# Patient Record
Sex: Female | Born: 1935 | Race: White | Hispanic: No | Smoking: Never smoker
Health system: Southern US, Community
[De-identification: ages and names within clinical notes are randomized; demographics above are authoritative.]

## PROBLEM LIST (undated history)

## (undated) DIAGNOSIS — F419 Anxiety disorder, unspecified: Secondary | ICD-10-CM

## (undated) DIAGNOSIS — K219 Gastro-esophageal reflux disease without esophagitis: Secondary | ICD-10-CM

## (undated) DIAGNOSIS — R0602 Shortness of breath: Secondary | ICD-10-CM

## (undated) DIAGNOSIS — J189 Pneumonia, unspecified organism: Secondary | ICD-10-CM

## (undated) DIAGNOSIS — F329 Major depressive disorder, single episode, unspecified: Secondary | ICD-10-CM

## (undated) DIAGNOSIS — F32A Depression, unspecified: Secondary | ICD-10-CM

## (undated) DIAGNOSIS — A419 Sepsis, unspecified organism: Secondary | ICD-10-CM

## (undated) DIAGNOSIS — G4730 Sleep apnea, unspecified: Secondary | ICD-10-CM

## (undated) HISTORY — PX: TONSILLECTOMY: SUR1361

## (undated) HISTORY — DX: Major depressive disorder, single episode, unspecified: F32.9

## (undated) HISTORY — DX: Hypercalcemia: E83.52

## (undated) HISTORY — DX: Depression, unspecified: F32.A

## (undated) HISTORY — PX: ENDOMETRIAL ABLATION: SHX621

---

## 2000-02-08 ENCOUNTER — Other Ambulatory Visit: Admission: RE | Admit: 2000-02-08 | Discharge: 2000-02-08 | Payer: Self-pay | Admitting: Family Medicine

## 2001-03-27 ENCOUNTER — Ambulatory Visit (HOSPITAL_BASED_OUTPATIENT_CLINIC_OR_DEPARTMENT_OTHER): Admission: RE | Admit: 2001-03-27 | Discharge: 2001-03-27 | Payer: Self-pay | Admitting: Family Medicine

## 2001-08-23 ENCOUNTER — Ambulatory Visit (HOSPITAL_BASED_OUTPATIENT_CLINIC_OR_DEPARTMENT_OTHER): Admission: RE | Admit: 2001-08-23 | Discharge: 2001-08-23 | Payer: Self-pay | Admitting: Internal Medicine

## 2004-06-27 ENCOUNTER — Ambulatory Visit: Payer: Self-pay | Admitting: Family Medicine

## 2004-08-23 ENCOUNTER — Ambulatory Visit: Payer: Self-pay | Admitting: Family Medicine

## 2005-07-09 ENCOUNTER — Ambulatory Visit: Payer: Self-pay | Admitting: Family Medicine

## 2005-09-30 ENCOUNTER — Ambulatory Visit: Payer: Self-pay | Admitting: Family Medicine

## 2006-03-06 ENCOUNTER — Ambulatory Visit: Payer: Self-pay | Admitting: Family Medicine

## 2007-03-10 ENCOUNTER — Ambulatory Visit: Payer: Self-pay | Admitting: Family Medicine

## 2007-10-25 ENCOUNTER — Ambulatory Visit: Payer: Self-pay | Admitting: Otolaryngology

## 2007-11-22 ENCOUNTER — Ambulatory Visit: Payer: Self-pay | Admitting: Otolaryngology

## 2008-10-27 ENCOUNTER — Ambulatory Visit: Payer: Self-pay | Admitting: Specialist

## 2009-08-12 HISTORY — PX: EYE SURGERY: SHX253

## 2009-10-03 ENCOUNTER — Ambulatory Visit: Payer: Self-pay | Admitting: Specialist

## 2010-03-05 ENCOUNTER — Emergency Department: Payer: Self-pay | Admitting: Emergency Medicine

## 2010-03-28 ENCOUNTER — Ambulatory Visit: Payer: Self-pay | Admitting: Psychiatry

## 2010-05-23 ENCOUNTER — Ambulatory Visit: Payer: Self-pay | Admitting: Internal Medicine

## 2010-06-08 ENCOUNTER — Ambulatory Visit: Payer: Self-pay | Admitting: Internal Medicine

## 2011-12-25 ENCOUNTER — Encounter: Payer: Self-pay | Admitting: Internal Medicine

## 2011-12-25 ENCOUNTER — Ambulatory Visit (INDEPENDENT_AMBULATORY_CARE_PROVIDER_SITE_OTHER): Payer: Medicare Other | Admitting: Internal Medicine

## 2011-12-25 VITALS — BP 116/64 | HR 70 | Temp 98.2°F | Resp 16 | Ht 63.0 in | Wt 168.2 lb

## 2011-12-25 DIAGNOSIS — N390 Urinary tract infection, site not specified: Secondary | ICD-10-CM | POA: Insufficient documentation

## 2011-12-25 DIAGNOSIS — F39 Unspecified mood [affective] disorder: Secondary | ICD-10-CM

## 2011-12-25 DIAGNOSIS — Z0000 Encounter for general adult medical examination without abnormal findings: Secondary | ICD-10-CM | POA: Insufficient documentation

## 2011-12-25 DIAGNOSIS — F32A Depression, unspecified: Secondary | ICD-10-CM | POA: Insufficient documentation

## 2011-12-25 DIAGNOSIS — Z79899 Other long term (current) drug therapy: Secondary | ICD-10-CM

## 2011-12-25 DIAGNOSIS — F329 Major depressive disorder, single episode, unspecified: Secondary | ICD-10-CM

## 2011-12-25 LAB — LIPID PANEL
HDL: 50.4 mg/dL (ref >39.00)
Triglycerides: 128 mg/dL (ref 0.0–149.0)
VLDL: 25.6 mg/dL (ref 0.0–40.0)

## 2011-12-25 LAB — POCT URINALYSIS DIPSTICK
Glucose, UA: NEGATIVE
Ketones, UA: NEGATIVE
Spec Grav, UA: 1.02

## 2011-12-25 LAB — HM COLONOSCOPY

## 2011-12-25 LAB — TSH: TSH: 0.83 u[IU]/mL (ref 0.35–5.50)

## 2011-12-25 LAB — HM MAMMOGRAPHY: HM Mammogram: NORMAL

## 2011-12-25 LAB — HEMOGLOBIN A1C: Hgb A1c MFr Bld: 5.5 % (ref 4.6–6.5)

## 2011-12-25 MED ORDER — CIPROFLOXACIN HCL 250 MG PO TABS: 250.0000 mg | ORAL_TABLET | Freq: Two times a day (BID) | ORAL | Status: AC

## 2011-12-25 NOTE — Patient Instructions (Signed)
Please complete the take-home stool test to screen for Colon Cancer .  I will call you with the results of your blood test.

## 2011-12-25 NOTE — Progress Notes (Signed)
Patient ID: [name], female   DOB: [birth year], 78 y.o.   MRN: [id number] The patient is here for annual Medicare wellness examination and management of other chronic and acute problems.   The risk factors are reflected in the social history.  The roster of all physicians providing medical care to patient - is listed in the Snapshot section of the chart.  Activities of daily living:  The patient is 100% independent in all ADLs: dressing, toileting, feeding as well as independent mobility  Home safety : The patient has smoke detectors in the home. They wear seatbelts.  There are no firearms at home. There is no violence in the home.   There is no risks for hepatitis, STDs or HIV. There is no   history of blood transfusion. They have no travel history to infectious disease endemic areas of the world.  The patient has seen their dentist in the last six month. They have seen their eye doctor in the last year. They admit to slight hearing difficulty with regard to whispered voices and some television programs.  They have deferred audiologic testing in the last year.  They do not  have excessive sun exposure. Discussed the need for sun protection: hats, long sleeves and use of sunscreen if there is significant sun exposure.   Diet: the importance of a healthy diet is discussed. They do have a healthy diet.  The benefits of regular aerobic exercise were discussed. She walks 4 times per week ,  20 minutes.   Depression screen: there are no signs or vegative symptoms of untreated depression- irritability, change in appetite, anhedonia, sadness/tearfullness.  Cognitive assessment: the patient manages all their financial and personal affairs and is actively engaged. They could relate day,date,year and events; recalled 2/3 objects at 3 minutes; performed clock-face test normally.  The following portions of the patient's history were reviewed and updated as appropriate: allergies, current medications,  past family history, past medical history,  past surgical history, past social history  and problem list.  Visual acuity was not assessed per patient preference since she has regular follow up with her ophthalmologist. Hearing and body mass index were assessed and reviewed.   During the course of the visit the patient was educated and counseled about appropriate screening and preventive services including : fall prevention , diabetes screening, nutrition counseling, colorectal cancer screening, and recommended immunizations.    Physical Exam BP 116/64  Pulse 70  Temp(Src) 98.2 F (36.8 C) (Oral)  Resp 16  Ht 5\' 3"  (1.6 m)  Wt 168 lb 4 oz (76.318 kg)  BMI 29.80 kg/m2  SpO2 96%  .BP 116/64  Pulse 70  Temp(Src) 98.2 F (36.8 C) (Oral)  Resp 16  Ht 5\' 3"  (1.6 m)  Wt 168 lb 4 oz (76.318 kg)  BMI 29.80 kg/m2  SpO2 96%  General Appearance:    Alert, cooperative, no distress, appears stated age  Head:    Normocephalic, without obvious abnormality, atraumatic  Eyes:    PERRL, conjunctiva/corneas clear, EOM's intact, fundi    benign, both eyes  Ears:    Normal TM's and external ear canals, both ears  Nose:   Nares normal, septum midline, mucosa normal, no drainage    or sinus tenderness  Throat:   Lips, mucosa, and tongue normal; teeth and gums normal  Neck:   Supple, symmetrical, trachea midline, no adenopathy;    thyroid:  no enlargement/tenderness/nodules; no carotid   bruit or JVD  Back:  Symmetric, no curvature, ROM normal, no CVA tenderness  Lungs:     Clear to auscultation bilaterally, respirations unlabored  Chest Wall:    No tenderness or deformity   Heart:    Regular rate and rhythm, S1 and S2 normal, no murmur, rub   or gallop  Breast Exam:    No tenderness, masses, or nipple abnormality  Abdomen:     Soft, non-tender, bowel sounds active all four quadrants,    no masses, no organomegaly  Genitalia:    Urethral polyp.  Otherwise Normal female without lesion,  discharge or tenderness     Extremities:   Extremities normal, atraumatic, no cyanosis or edema  Pulses:   2+ and symmetric all extremities  Skin:   Skin color, texture, turgor normal, no rashes or lesions  Lymph nodes:   Cervical, supraclavicular, and axillary nodes normal  Neurologic:   CNII-XII intact, normal strength, sensation and reflexes    throughout   Annual physical exam Pelvic was done. She hs a urethral polyp which she states was evaluated after prior pelvic exam so she has deferred further evaluation.  Breast exam done,  she was bilaterally tender.  Refuses mammogram and colonoscopy due to pain issue.    Depression Chronic managed with the program and Abilify. Her daughter who has accompanied her today she is functional and socially interactive.  UTI (lower urinary tract infection) A urinalysis was checked since patient was reporting recent onset of frequency.    Updated Medication List Outpatient Encounter Prescriptions as of 12/25/2011  Medication Sig Dispense Refill  . ARIPiprazole (ABILIFY) 5 MG tablet Take 5 mg by mouth daily.      Marland Kitchen buPROPion (WELLBUTRIN SR) 200 MG 12 hr tablet Take 200 mg by mouth daily.      . ciprofloxacin (CIPRO) 250 MG tablet Take 1 tablet (250 mg total) by mouth 2 (two) times daily.  10 tablet  0  . escitalopram (LEXAPRO) 20 MG tablet Take 20 mg by mouth daily.      . Multiple Vitamin (MULTIVITAMIN) tablet Take 1 tablet by mouth daily.      Marland Kitchen omeprazole (PRILOSEC OTC) 20 MG tablet Take 20 mg by mouth daily.

## 2011-12-26 ENCOUNTER — Other Ambulatory Visit: Payer: Self-pay | Admitting: *Deleted

## 2011-12-26 LAB — COMPLETE METABOLIC PANEL WITH GFR
Alkaline Phosphatase: 69 U/L (ref 39–117)
BUN: 19 mg/dL (ref 6–23)
CO2: 25 mEq/L (ref 19–32)
Creat: 1.05 mg/dL (ref 0.50–1.10)
GFR, Est African American: 59 mL/min — ABNORMAL LOW
GFR, Est Non African American: 51 mL/min — ABNORMAL LOW
Glucose, Bld: 78 mg/dL (ref 70–99)
Total Bilirubin: 0.6 mg/dL (ref 0.3–1.2)

## 2011-12-26 MED ORDER — CIPROFLOXACIN HCL 250 MG PO TABS: 250.0000 mg | ORAL_TABLET | Freq: Two times a day (BID) | ORAL | Status: AC

## 2011-12-26 NOTE — Assessment & Plan Note (Signed)
Chronic managed with the program and Abilify. Her daughter who has accompanied her today she is functional and socially interactive.

## 2011-12-26 NOTE — Assessment & Plan Note (Signed)
A urinalysis was checked since patient was reporting recent onset of frequency.

## 2011-12-26 NOTE — Assessment & Plan Note (Signed)
Pelvic was done. She hs a urethral polyp which she states was evaluated after prior pelvic exam so she has deferred further evaluation.  Breast exam done,  she was bilaterally tender.  Refuses mammogram and colonoscopy due to pain issue.

## 2011-12-27 LAB — URINE CULTURE: Colony Count: 2000

## 2012-05-18 ENCOUNTER — Encounter: Payer: Self-pay | Admitting: Internal Medicine

## 2012-06-05 ENCOUNTER — Telehealth: Payer: Self-pay | Admitting: Internal Medicine

## 2012-06-05 NOTE — Telephone Encounter (Signed)
Pt called to see if she could switch providers from dr Darrick Huntsman to dr walker Pt has 11/18 appointment with dr Darrick Huntsman Pt stated no reason.  She just wanted to see dr walker

## 2012-06-05 NOTE — Telephone Encounter (Signed)
Ok with me 

## 2012-06-18 NOTE — Telephone Encounter (Signed)
Fine with me

## 2012-06-18 NOTE — Telephone Encounter (Signed)
I will ask [name] to schedule patient with Dr. Dan Humphreys, next available.

## 2012-06-29 ENCOUNTER — Ambulatory Visit (INDEPENDENT_AMBULATORY_CARE_PROVIDER_SITE_OTHER): Payer: Medicare Other | Admitting: Internal Medicine

## 2012-06-29 ENCOUNTER — Encounter: Payer: Self-pay | Admitting: Internal Medicine

## 2012-06-29 ENCOUNTER — Encounter: Payer: Self-pay | Admitting: *Deleted

## 2012-06-29 ENCOUNTER — Ambulatory Visit: Payer: Medicare Other | Admitting: Internal Medicine

## 2012-06-29 VITALS — BP 104/60 | HR 72 | Temp 98.2°F | Ht 63.0 in | Wt 175.5 lb

## 2012-06-29 DIAGNOSIS — F32A Depression, unspecified: Secondary | ICD-10-CM

## 2012-06-29 DIAGNOSIS — K219 Gastro-esophageal reflux disease without esophagitis: Secondary | ICD-10-CM | POA: Insufficient documentation

## 2012-06-29 DIAGNOSIS — F329 Major depressive disorder, single episode, unspecified: Secondary | ICD-10-CM

## 2012-06-29 DIAGNOSIS — R197 Diarrhea, unspecified: Secondary | ICD-10-CM | POA: Insufficient documentation

## 2012-06-29 DIAGNOSIS — N390 Urinary tract infection, site not specified: Secondary | ICD-10-CM

## 2012-06-29 LAB — COMPREHENSIVE METABOLIC PANEL
ALT: 14 U/L (ref 0–35)
AST: 18 U/L (ref 0–37)
Albumin: 4.3 g/dL (ref 3.5–5.2)
CO2: 27 mEq/L (ref 19–32)
Calcium: 10.5 mg/dL (ref 8.4–10.5)
Chloride: 103 mEq/L (ref 96–112)
Potassium: 4.3 mEq/L (ref 3.5–5.1)

## 2012-06-29 LAB — POCT URINALYSIS DIPSTICK
Bilirubin, UA: NEGATIVE
Glucose, UA: NEGATIVE
Nitrite, UA: NEGATIVE
Spec Grav, UA: >=1.03

## 2012-06-29 MED ORDER — OMEPRAZOLE MAGNESIUM 20 MG PO TBEC: 20.0000 mg | DELAYED_RELEASE_TABLET | Freq: Every day | ORAL | Status: DC

## 2012-06-29 NOTE — Progress Notes (Signed)
Subjective:    Patient ID: [name], female    DOB: 04-Sep-1931, 78 y.o.   MRN: [id number]  HPI 78 year old female with history of severe depression, GERD presents to establish care. She was previously seen by another provider in our office. She presents today with her daughter. Her daughter reports that her symptoms of depression are much improved on current medications however patient continues to have some difficult interactions with family members and appears to be withdrawn. Patient has been reluctant to make any change in her antidepressant medications. She continues to follow with psychiatry every 2 months. Her daughter reports that at her worst, she had some paranoia and delusions with depression. She has not had recurrent of paranoia on current medications.  Patient is concerned today about a one-year history of intermittent watery diarrhea. Diarrhea typically occurs after eating. There are no specific food triggers. Patient denies any abdominal pain, fever, chills, nausea, vomiting. She has not lost any weight. She does not report change in appetite. She has never had a colonoscopy and is not interested in colonoscopy at this time. She notes that several other family members including her son and daughter both have irritable bowel syndrome.   Outpatient Prescriptions Prior to Visit  Medication Sig Dispense Refill  . ARIPiprazole (ABILIFY) 5 MG tablet Take 5 mg by mouth daily.      Marland Kitchen buPROPion (WELLBUTRIN SR) 200 MG 12 hr tablet Take 200 mg by mouth daily.      Marland Kitchen escitalopram (LEXAPRO) 20 MG tablet Take 20 mg by mouth daily.      . Multiple Vitamin (MULTIVITAMIN) tablet Take 1 tablet by mouth daily.      Marland Kitchen omeprazole (PRILOSEC OTC) 20 MG tablet Take 20 mg by mouth daily.         BP 104/60  Pulse 72  Temp 98.2 F (36.8 C) (Oral)  Ht 5\' 3"  (1.6 m)  Wt 175 lb 8 oz (79.606 kg)  BMI 31.09 kg/m2  SpO2 97%    Review of Systems  Constitutional: Negative for fever, chills,  appetite change, fatigue and unexpected weight change.  HENT: Negative for ear pain, congestion, sore throat, trouble swallowing, neck pain, voice change and sinus pressure.   Eyes: Negative for visual disturbance.  Respiratory: Negative for cough, shortness of breath, wheezing and stridor.   Cardiovascular: Negative for chest pain, palpitations and leg swelling.  Gastrointestinal: Positive for diarrhea. Negative for nausea, vomiting, abdominal pain, constipation, blood in stool, abdominal distention and anal bleeding.  Genitourinary: Negative for dysuria and flank pain.  Musculoskeletal: Negative for myalgias, arthralgias and gait problem.  Skin: Negative for color change and rash.  Neurological: Negative for dizziness and headaches.  Hematological: Negative for adenopathy. Does not bruise/bleed easily.  Psychiatric/Behavioral: Positive for behavioral problems and dysphoric mood. Negative for suicidal ideas and sleep disturbance. The patient is nervous/anxious.        Objective:   Physical Exam  Constitutional: She is oriented to person, place, and time. She appears well-developed and well-nourished. No distress.  HENT:  Head: Normocephalic and atraumatic.  Right Ear: External ear normal.  Left Ear: External ear normal.  Nose: Nose normal.  Mouth/Throat: Oropharynx is clear and moist. No oropharyngeal exudate.  Eyes: Conjunctivae normal are normal. Pupils are equal, round, and reactive to light. Right eye exhibits no discharge. Left eye exhibits no discharge. No scleral icterus.  Neck: Normal range of motion. Neck supple. No tracheal deviation present. No thyromegaly present.  Cardiovascular: Normal rate, regular rhythm,  normal heart sounds and intact distal pulses.  Exam reveals no gallop and no friction rub.   No murmur heard. Pulmonary/Chest: Effort normal and breath sounds normal. No respiratory distress. She has no wheezes. She has no rales. She exhibits no tenderness.  Abdominal:  Soft. Normal appearance and bowel sounds are normal. There is no hepatosplenomegaly. There is no tenderness.  Musculoskeletal: Normal range of motion. She exhibits no edema and no tenderness.  Lymphadenopathy:    She has no cervical adenopathy.  Neurological: She is alert and oriented to person, place, and time. No cranial nerve deficit. She exhibits normal muscle tone. Coordination normal.  Skin: Skin is warm and dry. No rash noted. She is not diaphoretic. No erythema. No pallor.  Psychiatric: She has a normal mood and affect. Judgment and thought content normal. Her speech is delayed. She is withdrawn. Cognition and memory are normal.          Assessment & Plan:

## 2012-06-29 NOTE — Addendum Note (Signed)
Addended by: Montine Circle D on: 06/29/2012 02:10 PM   Modules accepted: Orders

## 2012-06-29 NOTE — Assessment & Plan Note (Signed)
Chronic. Symptoms are controlled however not ideal. Patient continues to be withdrawn, interacts poorly with family. Patient has been unwilling to try changes in medications. Will request previous records from her psychiatrist. [name] continue current medications.

## 2012-06-29 NOTE — Assessment & Plan Note (Signed)
One year history of watery intermittent diarrhea. Symptoms seem most consistent with irritable bowel syndrome. However, will get stool culture. We also discussed referral to GI for evaluation to include colonoscopy however patient would like to defer this for now. Followup one month.

## 2012-07-01 LAB — URINE CULTURE: Organism ID, Bacteria: NO GROWTH

## 2012-07-03 ENCOUNTER — Other Ambulatory Visit: Payer: Self-pay | Admitting: *Deleted

## 2012-07-03 ENCOUNTER — Other Ambulatory Visit: Payer: Self-pay | Admitting: General Practice

## 2012-07-03 DIAGNOSIS — R197 Diarrhea, unspecified: Secondary | ICD-10-CM

## 2012-07-03 MED ORDER — CIPROFLOXACIN HCL 250 MG PO TABS: 250.0000 mg | ORAL_TABLET | Freq: Two times a day (BID) | ORAL | Status: DC

## 2012-07-07 LAB — STOOL CULTURE

## 2012-07-31 ENCOUNTER — Ambulatory Visit: Payer: Medicare Other | Admitting: Internal Medicine

## 2012-08-10 ENCOUNTER — Ambulatory Visit: Payer: Medicare Other | Admitting: Internal Medicine

## 2012-08-20 ENCOUNTER — Other Ambulatory Visit: Payer: Self-pay | Admitting: Internal Medicine

## 2012-08-20 ENCOUNTER — Ambulatory Visit (INDEPENDENT_AMBULATORY_CARE_PROVIDER_SITE_OTHER): Payer: Medicare Other | Admitting: Internal Medicine

## 2012-08-20 ENCOUNTER — Encounter: Payer: Self-pay | Admitting: Internal Medicine

## 2012-08-20 VITALS — BP 130/80 | HR 71 | Temp 97.7°F | Ht 63.0 in | Wt 179.0 lb

## 2012-08-20 DIAGNOSIS — F32A Depression, unspecified: Secondary | ICD-10-CM

## 2012-08-20 DIAGNOSIS — F329 Major depressive disorder, single episode, unspecified: Secondary | ICD-10-CM

## 2012-08-20 DIAGNOSIS — R197 Diarrhea, unspecified: Secondary | ICD-10-CM

## 2012-08-20 DIAGNOSIS — N760 Acute vaginitis: Secondary | ICD-10-CM

## 2012-08-20 MED ORDER — FLUCONAZOLE 150 MG PO TABS: 150.0000 mg | ORAL_TABLET | Freq: Once | ORAL | Status: DC

## 2012-08-20 NOTE — Assessment & Plan Note (Signed)
Symptoms and exam are most consistent with vaginal candidiasis. Will treat with Diflucan. Will send wet prep today.

## 2012-08-20 NOTE — Addendum Note (Signed)
Addended by: Montine Circle D on: 08/20/2012 01:39 PM   Modules accepted: Orders

## 2012-08-20 NOTE — Patient Instructions (Signed)
Vanicream twice daily for dry skin

## 2012-08-20 NOTE — Assessment & Plan Note (Signed)
Symptoms are relatively stable, with recent increase in Abilify to 5 mg daily. Will continue psychiatric followup. Encouraged her daughter to try to have psychiatric records and our office for review.

## 2012-08-20 NOTE — Progress Notes (Signed)
Subjective:    Patient ID: [name], female    DOB: [birth year], 79 y.o.   MRN: [id number]  HPI 79 year old female with history of depression, intermittent episodes of abdominal distention and diarrhea presents for followup. In regards to depression, she notes that her psychiatrist recently increased her dose of Abilify. She has not noted any change in her symptoms with the change in medication. She feels that she is doing relatively well. Her daughter reports that she still has limited interaction with family members.  In regards to intermittent episodes of diarrhea, she reports that symptoms have improved with elimination of coffee from her diet. She denies any current abdominal pain, distention, blood in her stool. She is not interested in GI evaluation for colonoscopy. Next  She is concerned about several year history of intermittent vaginal discharge and itching. This seems to come and go without any particular pattern. She has not used any over-the-counter preparations. She denies any pelvic pain, fever, chills.  Outpatient Encounter Prescriptions as of 08/20/2012  Medication Sig Dispense Refill  . ARIPiprazole (ABILIFY) 5 MG tablet Take 5 mg by mouth daily.      Marland Kitchen buPROPion (WELLBUTRIN SR) 200 MG 12 hr tablet Take 200 mg by mouth daily.      Marland Kitchen escitalopram (LEXAPRO) 20 MG tablet Take 20 mg by mouth daily.      . Multiple Vitamin (MULTIVITAMIN) tablet Take 1 tablet by mouth daily.      Marland Kitchen omeprazole (PRILOSEC OTC) 20 MG tablet Take 1 tablet (20 mg total) by mouth daily.  30 tablet  11  . fluconazole (DIFLUCAN) 150 MG tablet Take 1 tablet (150 mg total) by mouth once.  1 tablet  0  . [DISCONTINUED] ciprofloxacin (CIPRO) 250 MG tablet Take 1 tablet (250 mg total) by mouth 2 (two) times daily.  14 tablet  0   BP 130/80  Pulse 71  Temp 97.7 F (36.5 C) (Oral)  Ht 5\' 3"  (1.6 m)  Wt 179 lb (81.194 kg)  BMI 31.71 kg/m2  SpO2 97%  Review of Systems  Constitutional: Negative for fever,  chills, appetite change, fatigue and unexpected weight change.  HENT: Negative for ear pain, congestion, sore throat, trouble swallowing, neck pain, voice change and sinus pressure.   Eyes: Negative for visual disturbance.  Respiratory: Negative for cough, shortness of breath, wheezing and stridor.   Cardiovascular: Negative for chest pain, palpitations and leg swelling.  Gastrointestinal: Negative for nausea, vomiting, abdominal pain, diarrhea, constipation, blood in stool, abdominal distention and anal bleeding.  Genitourinary: Positive for vaginal discharge. Negative for dysuria and flank pain.  Musculoskeletal: Negative for myalgias, arthralgias and gait problem.  Skin: Negative for color change and rash.  Neurological: Negative for dizziness and headaches.  Hematological: Negative for adenopathy. Does not bruise/bleed easily.  Psychiatric/Behavioral: Positive for behavioral problems and dysphoric mood. Negative for suicidal ideas and sleep disturbance. The patient is not nervous/anxious.        Objective:   Physical Exam  Constitutional: She is oriented to person, place, and time. She appears well-developed and well-nourished. No distress.  HENT:  Head: Normocephalic and atraumatic.  Right Ear: External ear normal.  Left Ear: External ear normal.  Nose: Nose normal.  Mouth/Throat: Oropharynx is clear and moist. No oropharyngeal exudate.  Eyes: Conjunctivae normal are normal. Pupils are equal, round, and reactive to light. Right eye exhibits no discharge. Left eye exhibits no discharge. No scleral icterus.  Neck: Normal range of motion. Neck supple. No tracheal deviation  present. No thyromegaly present.  Cardiovascular: Normal rate, regular rhythm, normal heart sounds and intact distal pulses.  Exam reveals no gallop and no friction rub.   No murmur heard. Pulmonary/Chest: Effort normal and breath sounds normal. No respiratory distress. She has no wheezes. She has no rales. She  exhibits no tenderness.  Genitourinary: There is erythema around the vagina. No tenderness around the vagina. Vaginal discharge (white) found.  Musculoskeletal: Normal range of motion. She exhibits no edema and no tenderness.  Lymphadenopathy:    She has no cervical adenopathy.  Neurological: She is alert and oriented to person, place, and time. No cranial nerve deficit. She exhibits normal muscle tone. Coordination normal.  Skin: Skin is warm and dry. No rash noted. She is not diaphoretic. No erythema. No pallor.  Psychiatric: Judgment and thought content normal. Her speech is delayed. She is withdrawn. Cognition and memory are normal. She exhibits a depressed mood.          Assessment & Plan:

## 2012-08-20 NOTE — Assessment & Plan Note (Signed)
Symptoms have improved with elimination of coffee from her diet. We'll continue to monitor. Encouraged her to consider GI evaluation with colonoscopy if symptoms are recurrent.

## 2012-08-22 LAB — WET PREP BY MOLECULAR PROBE
Candida species: NEGATIVE
Trichomonas vaginosis: NEGATIVE

## 2012-08-25 MED ORDER — METRONIDAZOLE 500 MG PO TABS: 500.0000 mg | ORAL_TABLET | Freq: Two times a day (BID) | ORAL | Status: DC

## 2012-08-25 NOTE — Addendum Note (Signed)
Addended by: Ronna Polio A on: 08/25/2012 06:04 PM   Modules accepted: Orders

## 2012-09-19 ENCOUNTER — Emergency Department: Payer: Self-pay | Admitting: Emergency Medicine

## 2012-10-06 ENCOUNTER — Telehealth: Payer: Self-pay | Admitting: *Deleted

## 2012-10-06 NOTE — Telephone Encounter (Signed)
I agree with the leg elevation.  Monitor salt intake, etc.  With all of these issues, she needs an appt for evaluation.

## 2012-10-06 NOTE — Telephone Encounter (Signed)
Patient's daughter called stating that patient fell 09/19/12 and broke her left arm. Patient is having a lot of swelling in her legs and feet, especially her left and wants to know what she should do?. Patient is having  problems sleeping and does not want to lie down. Patient has been sleeping sitting up on the sofa or in the recliner. Patient's daughter wants to know if Dr. Dan Humphreys thinks that she may benefit from a powered lift recliner chair and if so, can she write a rx for this?.  Advised patient's daughter that she needs to make sure that she keeps her legs elevated. Patient's daughter advised that Dr. Dan Humphreys is out of the office and will return Wednesday.

## 2012-10-06 NOTE — Telephone Encounter (Signed)
Patient notified as instructed by telephone. Appointment scheduled for Friday.

## 2012-10-09 ENCOUNTER — Ambulatory Visit (INDEPENDENT_AMBULATORY_CARE_PROVIDER_SITE_OTHER): Payer: Medicare Other | Admitting: Internal Medicine

## 2012-10-09 ENCOUNTER — Encounter: Payer: Self-pay | Admitting: Internal Medicine

## 2012-10-09 VITALS — BP 110/70 | HR 78 | Temp 98.8°F | Wt 181.0 lb

## 2012-10-09 DIAGNOSIS — S42412A Displaced simple supracondylar fracture without intercondylar fracture of left humerus, initial encounter for closed fracture: Secondary | ICD-10-CM | POA: Insufficient documentation

## 2012-10-09 DIAGNOSIS — S42412S Displaced simple supracondylar fracture without intercondylar fracture of left humerus, sequela: Secondary | ICD-10-CM

## 2012-10-09 DIAGNOSIS — R609 Edema, unspecified: Secondary | ICD-10-CM

## 2012-10-09 DIAGNOSIS — R600 Localized edema: Secondary | ICD-10-CM | POA: Insufficient documentation

## 2012-10-09 DIAGNOSIS — S42309S Unspecified fracture of shaft of humerus, unspecified arm, sequela: Secondary | ICD-10-CM

## 2012-10-09 DIAGNOSIS — S42413A Displaced simple supracondylar fracture without intercondylar fracture of unspecified humerus, initial encounter for closed fracture: Secondary | ICD-10-CM | POA: Insufficient documentation

## 2012-10-09 MED ORDER — HYDROCODONE-ACETAMINOPHEN 5-325 MG PO TABS: 1.0000 | ORAL_TABLET | Freq: Every evening | ORAL | Status: DC | PRN

## 2012-10-10 NOTE — Progress Notes (Signed)
Subjective:    Patient ID: [name], female    DOB: January 10, 1932, 79 y.o.   MRN: [id number]  HPI 79YO female with h/o depression presents for acute visit after recent fall resulting in left humerus fracture.  Pt is now followed by orthopedics. Left arm has been in sling. Pain poorly controlled with use of tylenol. Pt has Rx for Vicodin but has not been taking it.  Unable to sleep at night because of pain.    Over last several days, has developed worsening LE edema bilaterally. Family notes pt has been seated for prolonged periods. Refusing to ambulate because of arm pain. They have tried to keep legs elevated and limit salt intake with no improvement.  Outpatient Encounter Prescriptions as of 10/09/2012  Medication Sig Dispense Refill  . ARIPiprazole (ABILIFY) 5 MG tablet Take 5 mg by mouth daily.      Marland Kitchen buPROPion (WELLBUTRIN SR) 200 MG 12 hr tablet Take 200 mg by mouth daily.      . Multiple Vitamin (MULTIVITAMIN) tablet Take 1 tablet by mouth daily.      Marland Kitchen omeprazole (PRILOSEC OTC) 20 MG tablet Take 1 tablet (20 mg total) by mouth daily.  30 tablet  11  . escitalopram (LEXAPRO) 20 MG tablet Take 20 mg by mouth daily.      . fluconazole (DIFLUCAN) 150 MG tablet Take 1 tablet (150 mg total) by mouth once.  1 tablet  0  . HYDROcodone-acetaminophen (NORCO/VICODIN) 5-325 MG per tablet Take 1-2 tablets by mouth at bedtime as needed for pain.  60 tablet  1  . metroNIDAZOLE (FLAGYL) 500 MG tablet Take 1 tablet (500 mg total) by mouth 2 (two) times daily.  14 tablet  0   No facility-administered encounter medications on file as of 10/09/2012.   BP 110/70  Pulse 78  Temp(Src) 98.8 F (37.1 C) (Oral)  Wt 181 lb (82.101 kg)  BMI 32.07 kg/m2  SpO2 95%  Review of Systems  Constitutional: Negative for fever, chills, appetite change, fatigue and unexpected weight change.  HENT: Negative for ear pain, congestion, sore throat, trouble swallowing, neck pain, voice change and sinus pressure.   Eyes:  Negative for visual disturbance.  Respiratory: Negative for cough, shortness of breath, wheezing and stridor.   Cardiovascular: Positive for leg swelling. Negative for chest pain and palpitations.  Gastrointestinal: Negative for nausea, vomiting, abdominal pain, diarrhea, constipation, blood in stool, abdominal distention and anal bleeding.  Genitourinary: Negative for dysuria and flank pain.  Musculoskeletal: Positive for arthralgias. Negative for myalgias and gait problem.  Skin: Negative for color change and rash.  Neurological: Negative for dizziness and headaches.  Hematological: Negative for adenopathy. Does not bruise/bleed easily.  Psychiatric/Behavioral: Negative for suicidal ideas, sleep disturbance and dysphoric mood. The patient is not nervous/anxious.        Objective:   Physical Exam  Constitutional: She is oriented to person, place, and time. She appears well-developed and well-nourished. No distress.  HENT:  Head: Normocephalic and atraumatic.  Right Ear: External ear normal.  Left Ear: External ear normal.  Nose: Nose normal.  Mouth/Throat: Oropharynx is clear and moist. No oropharyngeal exudate.  Eyes: Conjunctivae are normal. Pupils are equal, round, and reactive to light. Right eye exhibits no discharge. Left eye exhibits no discharge. No scleral icterus.  Neck: Normal range of motion. Neck supple. No tracheal deviation present. No thyromegaly present.  Cardiovascular: Normal rate, regular rhythm, normal heart sounds and intact distal pulses.  Exam reveals no gallop and  no friction rub.   No murmur heard. Pulmonary/Chest: Effort normal and breath sounds normal. No respiratory distress. She has no wheezes. She has no rales. She exhibits no tenderness.  Musculoskeletal: Normal range of motion. She exhibits edema (pitting edema bilaterl LE to knees, negative Homan bilaterally). She exhibits no tenderness.       Left upper arm: She exhibits tenderness, bony tenderness and  swelling.  Lymphadenopathy:    She has no cervical adenopathy.  Neurological: She is alert and oriented to person, place, and time. No cranial nerve deficit. She exhibits normal muscle tone. Coordination normal.  Skin: Skin is warm and dry. No rash noted. She is not diaphoretic. No erythema. No pallor.  Psychiatric: She has a normal mood and affect. Her behavior is normal. Judgment and thought content normal.          Assessment & Plan:

## 2012-10-10 NOTE — Assessment & Plan Note (Signed)
Most likely secondary to chronic venous insufficiency, made worse by recent sedentary behavior. Also discussed possibility of DVT, and recommended BLE ultrasound for evaluation. Pt declines at this time. Family will monitor symptoms over the weekend. They will keep legs elevated and use compression stockings during the day. Rx given for compression stockings. Follow up 2 weeks and prn.

## 2012-10-10 NOTE — Assessment & Plan Note (Signed)
Left humerus fracture after fall. Followed by ortho. Unable to sleep because of pain. Discussed using Tylenol during the day and Vicodin at night. Discussed limit of 3000mg  of tylenol per day. Pt has follow up with ortho next week.

## 2012-10-19 ENCOUNTER — Ambulatory Visit: Payer: Medicare Other | Admitting: Internal Medicine

## 2012-10-30 ENCOUNTER — Inpatient Hospital Stay: Payer: Self-pay | Admitting: Internal Medicine

## 2012-10-30 ENCOUNTER — Telehealth: Payer: Self-pay | Admitting: Internal Medicine

## 2012-10-30 LAB — URINALYSIS, COMPLETE
Bilirubin,UR: NEGATIVE
Glucose,UR: NEGATIVE mg/dL (ref 0–75)
Ketone: NEGATIVE
Nitrite: NEGATIVE
Ph: 8 (ref 4.5–8.0)
Protein: 30
RBC,UR: 27 /HPF (ref 0–5)
Specific Gravity: 1.058 (ref 1.003–1.030)
Squamous Epithelial: 3
WBC UR: 2 /HPF (ref 0–5)

## 2012-10-30 LAB — COMPREHENSIVE METABOLIC PANEL
Albumin: 3.3 g/dL — ABNORMAL LOW (ref 3.4–5.0)
Alkaline Phosphatase: 80 U/L (ref 50–136)
Anion Gap: 8 (ref 7–16)
BUN: 15 mg/dL (ref 7–18)
Bilirubin,Total: 0.7 mg/dL (ref 0.2–1.0)
Chloride: 102 mmol/L (ref 98–107)
Creatinine: 1.04 mg/dL (ref 0.60–1.30)
EGFR (African American): 60 — ABNORMAL LOW
EGFR (Non-African Amer.): 51 — ABNORMAL LOW
Glucose: 101 mg/dL — ABNORMAL HIGH (ref 65–99)
Osmolality: 275 (ref 275–301)
Potassium: 3.8 mmol/L (ref 3.5–5.1)
SGOT(AST): 14 U/L — ABNORMAL LOW (ref 15–37)
SGPT (ALT): 13 U/L (ref 12–78)
Sodium: 137 mmol/L (ref 136–145)
Total Protein: 7.7 g/dL (ref 6.4–8.2)

## 2012-10-30 LAB — CBC
HGB: 13.3 g/dL (ref 12.0–16.0)
MCH: 28.3 pg (ref 26.0–34.0)
MCHC: 32.2 g/dL (ref 32.0–36.0)
MCV: 88 fL (ref 80–100)
WBC: 11 10*3/uL (ref 3.6–11.0)

## 2012-10-30 LAB — LIPASE, BLOOD: Lipase: 49 U/L — ABNORMAL LOW (ref 73–393)

## 2012-10-30 NOTE — Telephone Encounter (Signed)
Patient Information:  Caller Name: Carney Bern  Phone: [phone]  Patient: [name], [name]  Gender: Female  DOB: 05/20/1932  Age: 79 Years  PCP: Ronna Polio (Adults only)  Office Follow Up:  Does the office need to follow up with this patient?: Yes  Instructions For The Office: No appts available now to meet "go to office now" disposition. Please call and advise if pt should go to ED.   Symptoms  Reason For Call & Symptoms: Pt started with back pain yesterday. Into the evening she developed a fever and began to c/o the back pain being located behind her ribs and up to her back on the right hand side. Pt had an oral temp of 100 yesterday (she's afeb today). She also began to complain that it hurts to breathe on both the in and out breath. She is weak and SOB when she gets up to move around.   Reviewed Health History In EMR: Yes  Reviewed Medications In EMR: Yes  Reviewed Allergies In EMR: Yes  Reviewed Surgeries / Procedures: Yes  Date of Onset of Symptoms: 10/29/2012  Guideline(s) Used:  Breathing Difficulty  Disposition Per Guideline:   Go to Office Now  Reason For Disposition Reached:   Mild difficulty breathing (e.g., minimal/no SOB at rest, SOB with walking, pulse < 100) of new onset or worse than normal  Advice Given:  N/A  RN Overrode Recommendation:  Document Patient  No appts available/please call and advise if pt should be seen in ED.

## 2012-10-30 NOTE — Telephone Encounter (Signed)
Informed patient daughter no openings available today and per Dr. Dan Humphreys she should go to urgent care.

## 2012-10-30 NOTE — Telephone Encounter (Signed)
See below my chart note.  Spoke with jean.  She stated she was waiting on call from dr walker nurse   Message    Appointment Request From: Armstead Peaks      With Provider: Shelia Media, MD [-Primary Care Physician-]      Preferred Date Range: From 10/30/2012 To 10/30/2012      Preferred Times: Friday Morning      Reason for visit: Office Visit      Comments:   Mom [facility]) is experiencing severe pains radiating from her back up to her shoulder on the right side. She was running fever yesterday afternoon and has not eaten anything solid since breakfast yesterday morning. Can she see Dr. Dan Humphreys this morning? I will try to call in when office opens, but urgent that I hear from someone this morning.      Thank you,   Erline Hau

## 2012-10-31 LAB — CBC WITH DIFFERENTIAL/PLATELET
Eosinophil %: 0.1 %
HCT: 38 % (ref 35.0–47.0)
HGB: 12.6 g/dL (ref 12.0–16.0)
Lymphocyte #: 1 10*3/uL (ref 1.0–3.6)
Lymphocyte %: 9.6 %
MCHC: 33 g/dL (ref 32.0–36.0)
MCV: 88 fL (ref 80–100)
Monocyte %: 6.6 %
Platelet: 194 10*3/uL (ref 150–440)
RBC: 4.34 10*6/uL (ref 3.80–5.20)
RDW: 14.3 % (ref 11.5–14.5)

## 2012-10-31 LAB — BASIC METABOLIC PANEL
Anion Gap: 8 (ref 7–16)
BUN: 13 mg/dL (ref 7–18)
Calcium, Total: 9.7 mg/dL (ref 8.5–10.1)
EGFR (African American): >60
EGFR (Non-African Amer.): 58 — ABNORMAL LOW
Glucose: 91 mg/dL (ref 65–99)
Sodium: 139 mmol/L (ref 136–145)

## 2012-11-02 LAB — BASIC METABOLIC PANEL
Anion Gap: 4 — ABNORMAL LOW (ref 7–16)
BUN: 16 mg/dL (ref 7–18)
Calcium, Total: 9.4 mg/dL (ref 8.5–10.1)
Chloride: 106 mmol/L (ref 98–107)
Creatinine: 0.99 mg/dL (ref 0.60–1.30)
EGFR (African American): >60
Osmolality: 274 (ref 275–301)

## 2012-11-02 LAB — CBC WITH DIFFERENTIAL/PLATELET
Basophil %: 0.7 %
HGB: 11.9 g/dL — ABNORMAL LOW (ref 12.0–16.0)
Lymphocyte #: 1.2 10*3/uL (ref 1.0–3.6)
Lymphocyte %: 21.7 %
MCH: 29.1 pg (ref 26.0–34.0)
MCHC: 33.7 g/dL (ref 32.0–36.0)
Monocyte #: 0.5 x10 3/mm (ref 0.2–0.9)
Monocyte %: 8.8 %
Neutrophil #: 3.6 10*3/uL (ref 1.4–6.5)
Neutrophil %: 65.6 %
RBC: 4.11 10*6/uL (ref 3.80–5.20)
RDW: 13.8 % (ref 11.5–14.5)

## 2012-11-04 ENCOUNTER — Telehealth: Payer: Self-pay | Admitting: Internal Medicine

## 2012-11-04 LAB — CLOSTRIDIUM DIFFICILE BY PCR

## 2012-11-04 NOTE — Telephone Encounter (Signed)
Fwd to Dr. Walker 

## 2012-11-04 NOTE — Telephone Encounter (Signed)
Patient scheduled for a hospital follow up on 4.2.14

## 2012-11-11 ENCOUNTER — Encounter: Payer: Self-pay | Admitting: Internal Medicine

## 2012-11-11 ENCOUNTER — Ambulatory Visit (INDEPENDENT_AMBULATORY_CARE_PROVIDER_SITE_OTHER): Payer: Medicare Other | Admitting: Internal Medicine

## 2012-11-11 VITALS — BP 108/68 | HR 74 | Temp 98.4°F | Wt 172.0 lb

## 2012-11-11 DIAGNOSIS — Z09 Encounter for follow-up examination after completed treatment for conditions other than malignant neoplasm: Secondary | ICD-10-CM | POA: Insufficient documentation

## 2012-11-11 DIAGNOSIS — R197 Diarrhea, unspecified: Secondary | ICD-10-CM

## 2012-11-11 DIAGNOSIS — J189 Pneumonia, unspecified organism: Secondary | ICD-10-CM

## 2012-11-11 DIAGNOSIS — R600 Localized edema: Secondary | ICD-10-CM

## 2012-11-11 DIAGNOSIS — R609 Edema, unspecified: Secondary | ICD-10-CM

## 2012-11-11 DIAGNOSIS — A419 Sepsis, unspecified organism: Secondary | ICD-10-CM | POA: Insufficient documentation

## 2012-11-11 LAB — CBC WITH DIFFERENTIAL/PLATELET
Basophils Absolute: 0.1 10*3/uL (ref 0.0–0.1)
Eosinophils Absolute: 0 10*3/uL (ref 0.0–0.7)
Eosinophils Relative: 0 % (ref 0.0–5.0)
HCT: 40.9 % (ref 36.0–46.0)
Lymphs Abs: 1.9 10*3/uL (ref 0.7–4.0)
MCHC: 32.8 g/dL (ref 30.0–36.0)
MCV: 86.2 fl (ref 78.0–100.0)
Monocytes Absolute: 0.5 10*3/uL (ref 0.1–1.0)
Neutrophils Relative %: 66.7 % (ref 43.0–77.0)
Platelets: 361 10*3/uL (ref 150.0–400.0)
RDW: 14.4 % (ref 11.5–14.6)
WBC: 7.3 10*3/uL (ref 4.5–10.5)

## 2012-11-11 LAB — COMPREHENSIVE METABOLIC PANEL
ALT: 17 U/L (ref 0–35)
Albumin: 3.4 g/dL — ABNORMAL LOW (ref 3.5–5.2)
Alkaline Phosphatase: 66 U/L (ref 39–117)
Glucose, Bld: 80 mg/dL (ref 70–99)
Potassium: 4.2 mEq/L (ref 3.5–5.1)
Sodium: 138 mEq/L (ref 135–145)
Total Protein: 7.1 g/dL (ref 6.0–8.3)

## 2012-11-11 NOTE — Progress Notes (Signed)
Subjective:    Patient ID: [name], female    DOB: 03-Nov-1931, 79 y.o.   MRN: [id number]  HPI 79 year old female with history of depression presents for followup after recent hospitalization for community-acquired pneumonia and sepsis. She developed cough, fever and severe right-sided chest pain on March 20. She was admitted through the [facility] ER on March 21 for community-acquired pneumonia in the right lower lobe. At that time, she was noted to be hypoxic and hypotensive, consistent with SIRS/sepsis. She was treated with Levaquin and IV fluids. Over the subsequent days she was transitioned to oral Augmentin. During her hospitalization, she developed some watery diarrhea. Testing for C. difficile was performed and was negative. She was discharged home on 11/04/2012. Since being home, her daughter reports she has been doing well. She has not had any recent cough, chest pain, fever, shortness of breath. She continues to have several episodes per day of watery diarrhea. She denies abdominal pain. She denies blood in her stool. She denies any nausea or vomiting. Her daughter have been instructed to give her mother Imodium every morning to help with symptoms of diarrhea. She has been doing this with minimal improvement.  Outpatient Encounter Prescriptions as of 11/11/2012  Medication Sig Dispense Refill  . ARIPiprazole (ABILIFY) 5 MG tablet Take 5 mg by mouth daily.      Marland Kitchen buPROPion (WELLBUTRIN SR) 200 MG 12 hr tablet Take 200 mg by mouth daily.      . cholecalciferol (VITAMIN D) 1000 UNITS tablet Take 1,000 Units by mouth daily.      Marland Kitchen escitalopram (LEXAPRO) 20 MG tablet Take 20 mg by mouth daily.      . Multiple Vitamin (MULTIVITAMIN) tablet Take 1 tablet by mouth daily.      Marland Kitchen omeprazole (PRILOSEC OTC) 20 MG tablet Take 1 tablet (20 mg total) by mouth daily.  30 tablet  11  . [DISCONTINUED] HYDROcodone-acetaminophen (NORCO/VICODIN) 5-325 MG per tablet Take 1-2 tablets by mouth at bedtime as needed for  pain.  60 tablet  1  . [DISCONTINUED] amoxicillin-clavulanate (AUGMENTIN) 500-125 MG per tablet       . [DISCONTINUED] fluconazole (DIFLUCAN) 150 MG tablet Take 1 tablet (150 mg total) by mouth once.  1 tablet  0  . [DISCONTINUED] metroNIDAZOLE (FLAGYL) 500 MG tablet Take 1 tablet (500 mg total) by mouth 2 (two) times daily.  14 tablet  0   No facility-administered encounter medications on file as of 11/11/2012.   BP 108/68  Pulse 74  Temp(Src) 98.4 F (36.9 C) (Oral)  Wt 172 lb (78.019 kg)  BMI 30.48 kg/m2  SpO2 97%  Review of Systems  Constitutional: Negative for fever, chills, appetite change, fatigue and unexpected weight change.  HENT: Negative for ear pain, congestion, sore throat, trouble swallowing, neck pain, voice change and sinus pressure.   Eyes: Negative for visual disturbance.  Respiratory: Negative for cough, shortness of breath, wheezing and stridor.   Cardiovascular: Positive for leg swelling. Negative for chest pain and palpitations.  Gastrointestinal: Negative for nausea, vomiting, abdominal pain, diarrhea, constipation, blood in stool, abdominal distention and anal bleeding.  Genitourinary: Negative for dysuria and flank pain.  Musculoskeletal: Negative for myalgias, arthralgias and gait problem.  Skin: Negative for color change and rash.  Neurological: Negative for dizziness and headaches.  Hematological: Negative for adenopathy. Does not bruise/bleed easily.  Psychiatric/Behavioral: Positive for dysphoric mood. Negative for suicidal ideas and sleep disturbance. The patient is not nervous/anxious.        Objective:  Physical Exam  Constitutional: She is oriented to person, place, and time. She appears well-developed and well-nourished. No distress.  HENT:  Head: Normocephalic and atraumatic.  Right Ear: External ear normal.  Left Ear: External ear normal.  Nose: Nose normal.  Mouth/Throat: Oropharynx is clear and moist. No oropharyngeal exudate.  Eyes:  Conjunctivae are normal. Pupils are equal, round, and reactive to light. Right eye exhibits no discharge. Left eye exhibits no discharge. No scleral icterus.  Neck: Normal range of motion. Neck supple. No tracheal deviation present. No thyromegaly present.  Cardiovascular: Normal rate, regular rhythm, normal heart sounds and intact distal pulses.  Exam reveals no gallop and no friction rub.   No murmur heard. Pulmonary/Chest: Effort normal. No accessory muscle usage. Not tachypneic. No respiratory distress. She has no decreased breath sounds. She has no wheezes. She has rhonchi in the right lower field. She has no rales. She exhibits no tenderness.  Musculoskeletal: Normal range of motion. She exhibits no edema and no tenderness.  Lymphadenopathy:    She has no cervical adenopathy.  Neurological: She is alert and oriented to person, place, and time. No cranial nerve deficit. She exhibits normal muscle tone. Coordination normal.  Skin: Skin is warm and dry. No rash noted. She is not diaphoretic. No erythema. No pallor.  Psychiatric: Judgment and thought content normal. Her speech is delayed. She is withdrawn. Cognition and memory are normal. She exhibits a depressed mood.          Assessment & Plan:

## 2012-11-11 NOTE — Assessment & Plan Note (Signed)
Improved with use of compression stockings. Will continue.

## 2012-11-11 NOTE — Assessment & Plan Note (Signed)
Recent hospitalization for CAP and sepsis. Clinical symptoms have resolved, no further cough, chest pain, fever. Completed antibiotics. Having some watery diarrhea. CDiff testing inpatient was negative. Will repeat today. Follow up in 4 weeks and prn.

## 2012-11-11 NOTE — Assessment & Plan Note (Signed)
Recent hospitalization for CAP, s/p antibiotics including Levaquin and Augmentin. Question CDiff infection causing diarrhea. Testing for CDiff inpatient was negative. Will repeat testing today. Continue Immodium once daily as prescribed in the hospital. If any abdominal pain, worsening symptoms, pt will call or RTC. Will also check CBC and CMP with labs.

## 2012-11-11 NOTE — Assessment & Plan Note (Signed)
Recent hospitalization for CAP and sepsis, discharged 11/04/2012. Doing well. No recurrent fever, chest pain, cough. Diarrhea noted and testing for CDiff sent. Will have pt and daughter monitor for recurrent symptoms. Follow up 2 weeks with plan for repeat CXR at that time.

## 2012-11-11 NOTE — Assessment & Plan Note (Signed)
Recent hospitalization for RLL pneumonia and sepsis. Completed antibiotics. No recurrent fever, cough. Exam remarkable for RLL crackles. Will plan to get follow up CXR in 4 weeks. Pt or her daughter will call sooner if any recurrent fever, cough, chest pain.

## 2012-11-12 DIAGNOSIS — M79609 Pain in unspecified limb: Secondary | ICD-10-CM

## 2012-11-12 DIAGNOSIS — F0390 Unspecified dementia without behavioral disturbance: Secondary | ICD-10-CM

## 2012-11-12 DIAGNOSIS — J189 Pneumonia, unspecified organism: Secondary | ICD-10-CM

## 2012-11-12 DIAGNOSIS — S42309D Unspecified fracture of shaft of humerus, unspecified arm, subsequent encounter for fracture with routine healing: Secondary | ICD-10-CM

## 2012-11-13 LAB — CLOSTRIDIUM DIFFICILE EIA: CDIFTX: NEGATIVE

## 2012-11-16 LAB — STOOL CULTURE

## 2012-11-26 ENCOUNTER — Ambulatory Visit (INDEPENDENT_AMBULATORY_CARE_PROVIDER_SITE_OTHER): Payer: Medicare Other | Admitting: Internal Medicine

## 2012-11-26 ENCOUNTER — Encounter: Payer: Self-pay | Admitting: Internal Medicine

## 2012-11-26 VITALS — BP 120/62 | HR 84 | Temp 98.3°F | Wt 169.0 lb

## 2012-11-26 DIAGNOSIS — F32A Depression, unspecified: Secondary | ICD-10-CM

## 2012-11-26 DIAGNOSIS — F3289 Other specified depressive episodes: Secondary | ICD-10-CM

## 2012-11-26 DIAGNOSIS — S42412D Displaced simple supracondylar fracture without intercondylar fracture of left humerus, subsequent encounter for fracture with routine healing: Secondary | ICD-10-CM

## 2012-11-26 DIAGNOSIS — R197 Diarrhea, unspecified: Secondary | ICD-10-CM

## 2012-11-26 DIAGNOSIS — S42309D Unspecified fracture of shaft of humerus, unspecified arm, subsequent encounter for fracture with routine healing: Secondary | ICD-10-CM

## 2012-11-26 DIAGNOSIS — F329 Major depressive disorder, single episode, unspecified: Secondary | ICD-10-CM

## 2012-11-26 DIAGNOSIS — J189 Pneumonia, unspecified organism: Secondary | ICD-10-CM

## 2012-11-26 NOTE — Assessment & Plan Note (Signed)
Diarrhea after her last hospitalization has resolved. Labs were normal including stool cultures which were negative. Will continue to monitor.

## 2012-11-26 NOTE — Assessment & Plan Note (Signed)
Symptoms of cough and shortness of breath have resolved. Exam is normal today. We'll plan to repeat chest x-ray in followup prior to next visit.

## 2012-11-26 NOTE — Assessment & Plan Note (Signed)
Range of motion and swelling have improved in patient's left arm after recent fracture. New orders placed today for additional range of motion exercises including hand therapy. Followup in 4 weeks.

## 2012-11-26 NOTE — Progress Notes (Signed)
Subjective:    Patient ID: [name], female    DOB: 1932-07-28, 79 y.o.   MRN: [id number]  HPI 79 year old female with history of depression, left humerus fracture, recent hospitalization for community-acquired pneumonia complicated by sepsis presents for followup. She reports she is feeling well. Diarrhea that she experienced after hospitalization has resolved. Stool cultures were negative. Symptoms of cough and shortness of breath have resolved. She denies any recurrent fever. Her energy level has improved. Swelling in her left arm has improved and she continues to undergo physical therapy. Range of motion and strength in her left hand have also improved.  Outpatient Encounter Prescriptions as of 11/26/2012  Medication Sig Dispense Refill  . ARIPiprazole (ABILIFY) 5 MG tablet Take 5 mg by mouth daily.      Marland Kitchen buPROPion (WELLBUTRIN SR) 200 MG 12 hr tablet Take 200 mg by mouth daily.      . cholecalciferol (VITAMIN D) 1000 UNITS tablet Take 1,000 Units by mouth daily.      Marland Kitchen escitalopram (LEXAPRO) 20 MG tablet Take 20 mg by mouth daily.      . Multiple Vitamin (MULTIVITAMIN) tablet Take 1 tablet by mouth daily.      Marland Kitchen omeprazole (PRILOSEC OTC) 20 MG tablet Take 1 tablet (20 mg total) by mouth daily.  30 tablet  11  . [DISCONTINUED] omeprazole (PRILOSEC) 20 MG capsule        No facility-administered encounter medications on file as of 11/26/2012.   BP 120/62  Pulse 84  Temp(Src) 98.3 F (36.8 C) (Oral)  Wt 169 lb (76.658 kg)  BMI 29.94 kg/m2  SpO2 97%  Review of Systems  Constitutional: Negative for fever, chills, appetite change, fatigue and unexpected weight change.  HENT: Negative for ear pain, congestion, sore throat, trouble swallowing, neck pain, voice change and sinus pressure.   Eyes: Negative for visual disturbance.  Respiratory: Negative for cough, shortness of breath, wheezing and stridor.   Cardiovascular: Negative for chest pain, palpitations and leg swelling.   Gastrointestinal: Negative for nausea, vomiting, abdominal pain, diarrhea, constipation, blood in stool, abdominal distention and anal bleeding.  Genitourinary: Negative for dysuria and flank pain.  Musculoskeletal: Negative for myalgias, arthralgias and gait problem.  Skin: Negative for color change and rash.  Neurological: Negative for dizziness and headaches.  Hematological: Negative for adenopathy. Does not bruise/bleed easily.  Psychiatric/Behavioral: Negative for suicidal ideas, sleep disturbance and dysphoric mood. The patient is not nervous/anxious.        Objective:   Physical Exam  Constitutional: She is oriented to person, place, and time. She appears well-developed and well-nourished. No distress.  HENT:  Head: Normocephalic and atraumatic.  Right Ear: External ear normal.  Left Ear: External ear normal.  Nose: Nose normal.  Mouth/Throat: Oropharynx is clear and moist. No oropharyngeal exudate.  Eyes: Conjunctivae are normal. Pupils are equal, round, and reactive to light. Right eye exhibits no discharge. Left eye exhibits no discharge. No scleral icterus.  Neck: Normal range of motion. Neck supple. No tracheal deviation present. No thyromegaly present.  Cardiovascular: Normal rate, regular rhythm, normal heart sounds and intact distal pulses.  Exam reveals no gallop and no friction rub.   No murmur heard. Pulmonary/Chest: Effort normal and breath sounds normal. No accessory muscle usage. Not tachypneic. No respiratory distress. She has no decreased breath sounds. She has no wheezes. She has no rhonchi. She has no rales. She exhibits no tenderness.  Musculoskeletal: She exhibits no edema and no tenderness.  Left hand: She exhibits decreased range of motion and swelling. She exhibits no tenderness.  Lymphadenopathy:    She has no cervical adenopathy.  Neurological: She is alert and oriented to person, place, and time. No cranial nerve deficit. She exhibits normal muscle  tone. Coordination normal.  Skin: Skin is warm and dry. No rash noted. She is not diaphoretic. No erythema. No pallor.  Psychiatric: She has a normal mood and affect. Her behavior is normal. Judgment and thought content normal.          Assessment & Plan:

## 2012-11-26 NOTE — Assessment & Plan Note (Signed)
Affect is much improved today. We'll continue current medications and close followup with psychiatry.

## 2012-12-02 ENCOUNTER — Encounter: Payer: Self-pay | Admitting: Internal Medicine

## 2012-12-10 ENCOUNTER — Encounter: Payer: Self-pay | Admitting: Internal Medicine

## 2012-12-16 ENCOUNTER — Ambulatory Visit: Payer: Self-pay | Admitting: Internal Medicine

## 2012-12-17 ENCOUNTER — Telehealth: Payer: Self-pay | Admitting: Internal Medicine

## 2012-12-17 NOTE — Telephone Encounter (Signed)
CXR 5/7 was normal.

## 2012-12-17 NOTE — Telephone Encounter (Signed)
Patient informed and verbally agreed.  

## 2012-12-24 ENCOUNTER — Encounter: Payer: Self-pay | Admitting: Internal Medicine

## 2012-12-24 ENCOUNTER — Ambulatory Visit (INDEPENDENT_AMBULATORY_CARE_PROVIDER_SITE_OTHER): Payer: Medicare Other | Admitting: Internal Medicine

## 2012-12-24 VITALS — BP 130/80 | HR 81 | Temp 98.1°F | Wt 167.0 lb

## 2012-12-24 DIAGNOSIS — S42309D Unspecified fracture of shaft of humerus, unspecified arm, subsequent encounter for fracture with routine healing: Secondary | ICD-10-CM

## 2012-12-24 DIAGNOSIS — S42412D Displaced simple supracondylar fracture without intercondylar fracture of left humerus, subsequent encounter for fracture with routine healing: Secondary | ICD-10-CM

## 2012-12-24 DIAGNOSIS — R197 Diarrhea, unspecified: Secondary | ICD-10-CM

## 2012-12-24 DIAGNOSIS — J189 Pneumonia, unspecified organism: Secondary | ICD-10-CM

## 2012-12-24 NOTE — Progress Notes (Signed)
Subjective:    Patient ID: [name], female    DOB: [birth year], 79 y.o.   MRN: [id number]  HPI 79 year old female with history of depression, recent community-acquired pneumonia, recent left humeral fracture, and diarrhea presents for followup. She reports she has been feeling well. No further fever or chills. No further cough. Denies shortness of breath. She has been working with physical therapist and range of motion in her left arm and hand have improved.  In regards to recent episodes of diarrhea, patient reports that they were able to identify artificial sweeteners as a trigger for her symptoms. Symptoms have resolved after stopping artificial sweeteners.  She notes mild aching pain in her left knee which is more pronounced with ambulation and activity. She denies swelling or redness over her knee. She does not wish to pursue further evaluation at this point.  Outpatient Encounter Prescriptions as of 12/24/2012  Medication Sig Dispense Refill  . ALPRAZolam (XANAX) 0.25 MG tablet       . ARIPiprazole (ABILIFY) 5 MG tablet Take 5 mg by mouth daily.      Marland Kitchen buPROPion (WELLBUTRIN SR) 200 MG 12 hr tablet Take 200 mg by mouth daily.      . cholecalciferol (VITAMIN D) 1000 UNITS tablet Take 1,000 Units by mouth daily.      Marland Kitchen escitalopram (LEXAPRO) 20 MG tablet Take 20 mg by mouth daily.      Marland Kitchen omeprazole (PRILOSEC OTC) 20 MG tablet Take 1 tablet (20 mg total) by mouth daily.  30 tablet  11  . Multiple Vitamin (MULTIVITAMIN) tablet Take 1 tablet by mouth daily.       No facility-administered encounter medications on file as of 12/24/2012.   BP 130/80  Pulse 81  Temp(Src) 98.1 F (36.7 C) (Oral)  Wt 167 lb (75.751 kg)  BMI 29.59 kg/m2  SpO2 97%  Review of Systems  Constitutional: Negative for fever, chills, appetite change, fatigue and unexpected weight change.  HENT: Negative for ear pain, congestion, sore throat, trouble swallowing, neck pain, voice change and sinus pressure.    Eyes: Negative for visual disturbance.  Respiratory: Negative for cough, shortness of breath, wheezing and stridor.   Cardiovascular: Negative for chest pain, palpitations and leg swelling.  Gastrointestinal: Negative for nausea, vomiting, abdominal pain, diarrhea, constipation, blood in stool, abdominal distention and anal bleeding.  Genitourinary: Negative for dysuria and flank pain.  Musculoskeletal: Positive for arthralgias. Negative for myalgias and gait problem.  Skin: Negative for color change and rash.  Neurological: Negative for dizziness and headaches.  Hematological: Negative for adenopathy. Does not bruise/bleed easily.  Psychiatric/Behavioral: Negative for suicidal ideas, sleep disturbance and dysphoric mood. The patient is not nervous/anxious.        Objective:   Physical Exam  Constitutional: She is oriented to person, place, and time. She appears well-developed and well-nourished. No distress.  HENT:  Head: Normocephalic and atraumatic.  Right Ear: External ear normal.  Left Ear: External ear normal.  Nose: Nose normal.  Mouth/Throat: Oropharynx is clear and moist. No oropharyngeal exudate.  Eyes: Conjunctivae are normal. Pupils are equal, round, and reactive to light. Right eye exhibits no discharge. Left eye exhibits no discharge. No scleral icterus.  Neck: Normal range of motion. Neck supple. No tracheal deviation present. No thyromegaly present.  Cardiovascular: Normal rate, regular rhythm, normal heart sounds and intact distal pulses.  Exam reveals no gallop and no friction rub.   No murmur heard. Pulmonary/Chest: Effort normal and breath sounds normal. No accessory  muscle usage. Not tachypneic. No respiratory distress. She has no decreased breath sounds. She has no wheezes. She has no rhonchi. She has no rales. She exhibits no tenderness.  Musculoskeletal: Normal range of motion. She exhibits no edema and no tenderness.  Lymphadenopathy:    She has no cervical  adenopathy.  Neurological: She is alert and oriented to person, place, and time. No cranial nerve deficit. She exhibits normal muscle tone. Coordination normal.  Skin: Skin is warm and dry. No rash noted. She is not diaphoretic. No erythema. No pallor.  Psychiatric: She has a normal mood and affect. Judgment and thought content normal. Her speech is delayed. She is withdrawn.          Assessment & Plan:

## 2012-12-24 NOTE — Assessment & Plan Note (Signed)
Improved range of motion of the left arm and hand. Will continue physical therapy. Followup as needed.

## 2012-12-24 NOTE — Assessment & Plan Note (Signed)
Followup chest x-ray showed no recurrent infiltrate. Exam is normal today. Will continue to monitor.

## 2012-12-24 NOTE — Assessment & Plan Note (Signed)
Family was able to identify artificial sweeteners as trigger for her diarrhea. Symptoms have resolved with eliminating artificial sweeteners. Will continue to monitor.

## 2013-01-08 ENCOUNTER — Encounter: Payer: Self-pay | Admitting: Internal Medicine

## 2013-01-10 ENCOUNTER — Encounter: Payer: Self-pay | Admitting: Internal Medicine

## 2013-02-09 ENCOUNTER — Encounter: Payer: Self-pay | Admitting: Internal Medicine

## 2013-04-08 ENCOUNTER — Encounter: Payer: Medicare Other | Admitting: Internal Medicine

## 2013-05-07 ENCOUNTER — Ambulatory Visit (INDEPENDENT_AMBULATORY_CARE_PROVIDER_SITE_OTHER): Payer: Medicare Other | Admitting: Internal Medicine

## 2013-05-07 ENCOUNTER — Encounter: Payer: Self-pay | Admitting: Internal Medicine

## 2013-05-07 VITALS — BP 118/72 | HR 71 | Temp 97.8°F | Ht 63.0 in | Wt 163.0 lb

## 2013-05-07 DIAGNOSIS — R197 Diarrhea, unspecified: Secondary | ICD-10-CM

## 2013-05-07 DIAGNOSIS — F329 Major depressive disorder, single episode, unspecified: Secondary | ICD-10-CM

## 2013-05-07 DIAGNOSIS — I872 Venous insufficiency (chronic) (peripheral): Secondary | ICD-10-CM | POA: Insufficient documentation

## 2013-05-07 DIAGNOSIS — K219 Gastro-esophageal reflux disease without esophagitis: Secondary | ICD-10-CM

## 2013-05-07 DIAGNOSIS — I8310 Varicose veins of unspecified lower extremity with inflammation: Secondary | ICD-10-CM | POA: Insufficient documentation

## 2013-05-07 DIAGNOSIS — F32A Depression, unspecified: Secondary | ICD-10-CM

## 2013-05-07 DIAGNOSIS — I8312 Varicose veins of left lower extremity with inflammation: Secondary | ICD-10-CM

## 2013-05-07 DIAGNOSIS — Z0000 Encounter for general adult medical examination without abnormal findings: Secondary | ICD-10-CM

## 2013-05-07 DIAGNOSIS — Z23 Encounter for immunization: Secondary | ICD-10-CM

## 2013-05-07 LAB — CBC WITH DIFFERENTIAL/PLATELET
Basophils Absolute: 0 10*3/uL (ref 0.0–0.1)
Basophils Relative: 0.6 % (ref 0.0–3.0)
Eosinophils Absolute: 0.1 10*3/uL (ref 0.0–0.7)
HCT: 43.1 % (ref 36.0–46.0)
Hemoglobin: 14.4 g/dL (ref 12.0–15.0)
Lymphocytes Relative: 31.6 % (ref 12.0–46.0)
Lymphs Abs: 2.1 10*3/uL (ref 0.7–4.0)
MCHC: 33.3 g/dL (ref 30.0–36.0)
Monocytes Absolute: 0.4 10*3/uL (ref 0.1–1.0)
Monocytes Relative: 6.3 % (ref 3.0–12.0)
Neutro Abs: 3.9 10*3/uL (ref 1.4–7.7)
RBC: 4.94 Mil/uL (ref 3.87–5.11)

## 2013-05-07 LAB — COMPREHENSIVE METABOLIC PANEL
ALT: 13 U/L (ref 0–35)
Albumin: 4.1 g/dL (ref 3.5–5.2)
Alkaline Phosphatase: 65 U/L (ref 39–117)
BUN: 15 mg/dL (ref 6–23)
CO2: 27 mEq/L (ref 19–32)
Calcium: 10.6 mg/dL — ABNORMAL HIGH (ref 8.4–10.5)
Chloride: 104 mEq/L (ref 96–112)
Glucose, Bld: 83 mg/dL (ref 70–99)
Potassium: 4.4 mEq/L (ref 3.5–5.1)
Sodium: 137 mEq/L (ref 135–145)
Total Protein: 7.2 g/dL (ref 6.0–8.3)

## 2013-05-07 LAB — LIPID PANEL
Cholesterol: 192 mg/dL (ref 0–200)
LDL Cholesterol: 117 mg/dL — ABNORMAL HIGH (ref 0–99)

## 2013-05-07 LAB — TSH: TSH: 2.62 u[IU]/mL (ref 0.35–5.50)

## 2013-05-07 MED ORDER — TRIAMCINOLONE ACETONIDE 0.1 % EX CREA: TOPICAL_CREAM | Freq: Two times a day (BID) | CUTANEOUS | Status: DC

## 2013-05-07 MED ORDER — OMEPRAZOLE MAGNESIUM 20 MG PO TBEC: 20.0000 mg | DELAYED_RELEASE_TABLET | Freq: Every day | ORAL | Status: DC

## 2013-05-07 NOTE — Assessment & Plan Note (Signed)
Symptoms stable on current medications. Continue to follow with Dr. Imogene Burn.

## 2013-05-07 NOTE — Assessment & Plan Note (Signed)
General medical exam normal today including breast exam. PAP and pelvic deferred given pt preference and age. Pt declines mammogram. Pt agrees to set up colonoscopy. Encouraged healthy diet and increased physical activity. Discussed falls prevention. Follow up 1 month.

## 2013-05-07 NOTE — Assessment & Plan Note (Signed)
Persistent symptoms of watery diarrhea. Recommended GI evaluation for colonoscopy (which pt has previously refused). Will set up referral. Continue prn Immodium at home.

## 2013-05-07 NOTE — Progress Notes (Signed)
Subjective:    Patient ID: [name], female    DOB: 21-Mar-1932, 79 y.o.   MRN: [id number]  HPI The patient is here for annual Medicare wellness examination and management of other chronic and acute problems.   The risk factors are reflected in the social history.  The roster of all physicians providing medical care to patient - is listed in the Snapshot section of the chart.  Activities of daily living:  The patient is 100% independent in all ADLs: dressing, toileting, feeding as well as independent mobility. Lives with daughter.  Home safety : The patient has smoke detectors in the home. They wear seatbelts.  There are no firearms at home. There is no violence in the home. Has ordered a medical alert device. Falls prevention set up in home, eg bathroom chair, handrails.  There is no risks for hepatitis, STDs or HIV. There is no history of blood transfusion. They have no travel history to infectious disease endemic areas of the world.  The patient has not seen their dentist in the last six month. Has dentures. Occasional trouble with fit of dentures. They have seen their eye doctor in the last year. Dr. Clydene Pugh Occasional issues with hearing soft voices.They have deferred audiologic testing in the last year.   They do not have excessive sun exposure. Discussed the need for sun protection: hats, long sleeves and use of sunscreen if there is significant sun exposure. Dermatologist - Dr. Cheree Ditto  Diet: the importance of a healthy diet is discussed. They do have a relatively healthy diet. Basically eats what she wants.  The benefits of regular aerobic exercise were discussed. She is not physically active. Some fatigue noted with physical activity.  Depression screen: there are no signs or vegative symptoms of depression- irritability, change in appetite, anhedonia, sadness/tearfullness. Symptoms of depression stable.  Cognitive assessment: the patient manages all their financial and  personal affairs and is actively engaged. They could relate day,date,year and events.  The following portions of the patient's history were reviewed and updated as appropriate: allergies, current medications, past family history, past medical history,  past surgical history, past social history  and problem list.  Visual acuity was not assessed per patient preference since she has regular follow up with her ophthalmologist. Hearing and body mass index were assessed and reviewed.   During the course of the visit the patient was educated and counseled about appropriate screening and preventive services including : fall prevention , diabetes screening, nutrition counseling, colorectal cancer screening, and recommended immunizations.    Outpatient Encounter Prescriptions as of 05/07/2013  Medication Sig Dispense Refill  . ARIPiprazole (ABILIFY) 5 MG tablet Take 5 mg by mouth daily.      Marland Kitchen buPROPion (WELLBUTRIN) 100 MG tablet Take 100 mg by mouth daily.      . cholecalciferol (VITAMIN D) 1000 UNITS tablet Take 1,000 Units by mouth daily.      Marland Kitchen escitalopram (LEXAPRO) 20 MG tablet Take 20 mg by mouth daily.      . Multiple Vitamin (MULTIVITAMIN) tablet Take 1 tablet by mouth daily.      Marland Kitchen omeprazole (PRILOSEC OTC) 20 MG tablet Take 1 tablet (20 mg total) by mouth daily.  30 tablet  11  . ALPRAZolam (XANAX) 0.25 MG tablet       . buPROPion (WELLBUTRIN SR) 200 MG 12 hr tablet Take 200 mg by mouth daily.       No facility-administered encounter medications on file as of 05/07/2013.  BP 118/72  Pulse 71  Temp(Src) 97.8 F (36.6 C) (Oral)  Ht 5\' 3"  (1.6 m)  Wt 163 lb (73.936 kg)  BMI 28.88 kg/m2  SpO2 96%    Review of Systems  Constitutional: Negative for fever, chills, appetite change, fatigue and unexpected weight change.  HENT: Negative for ear pain, congestion, sore throat, trouble swallowing, neck pain, voice change and sinus pressure.   Eyes: Negative for visual disturbance.   Respiratory: Negative for cough, shortness of breath, wheezing and stridor.   Cardiovascular: Negative for chest pain, palpitations and leg swelling.  Gastrointestinal: Positive for diarrhea. Negative for nausea, vomiting, abdominal pain, constipation, blood in stool, abdominal distention and anal bleeding.  Genitourinary: Negative for dysuria and flank pain.  Musculoskeletal: Negative for myalgias, arthralgias and gait problem.  Skin: Negative for color change and rash.  Neurological: Negative for dizziness and headaches.  Hematological: Negative for adenopathy. Does not bruise/bleed easily.  Psychiatric/Behavioral: Positive for dysphoric mood (chronic). Negative for suicidal ideas and sleep disturbance. The patient is not nervous/anxious.        Objective:   Physical Exam  Constitutional: She is oriented to person, place, and time. She appears well-developed and well-nourished. No distress.  HENT:  Head: Normocephalic and atraumatic.  Right Ear: External ear normal.  Left Ear: External ear normal.  Nose: Nose normal.  Mouth/Throat: Oropharynx is clear and moist. No oropharyngeal exudate.  Eyes: Conjunctivae are normal. Pupils are equal, round, and reactive to light. Right eye exhibits no discharge. Left eye exhibits no discharge. No scleral icterus.  Neck: Normal range of motion. Neck supple. No tracheal deviation present. No thyromegaly present.  Cardiovascular: Normal rate, regular rhythm, normal heart sounds and intact distal pulses.  Exam reveals no gallop and no friction rub.   No murmur heard. Pulmonary/Chest: Effort normal and breath sounds normal. No accessory muscle usage. Not tachypneic. No respiratory distress. She has no decreased breath sounds. She has no wheezes. She has no rhonchi. She has no rales. She exhibits no tenderness. Right breast exhibits no inverted nipple, no mass, no nipple discharge, no skin change and no tenderness. Left breast exhibits no inverted nipple,  no mass, no nipple discharge, no skin change and no tenderness. Breasts are symmetrical.  Abdominal: Soft. Bowel sounds are normal. She exhibits no distension and no mass. There is no tenderness. There is no rebound and no guarding.  Musculoskeletal: Normal range of motion. She exhibits no edema and no tenderness.  Lymphadenopathy:    She has no cervical adenopathy.  Neurological: She is alert and oriented to person, place, and time. No cranial nerve deficit. She exhibits normal muscle tone. Coordination normal.  Skin: Skin is warm and dry. No rash noted. She is not diaphoretic. There is erythema (left anterior lower leg). No pallor.  Psychiatric: Her speech is normal. Judgment and thought content normal. Her affect is blunt. She is withdrawn. Cognition and memory are normal.          Assessment & Plan:

## 2013-05-07 NOTE — Assessment & Plan Note (Signed)
Symptoms controlled with omeprazole. Will continue.

## 2013-05-07 NOTE — Assessment & Plan Note (Signed)
Mild erythema left anterior lower leg consistent with stasis dermatitis. Will continue Vanicream and add topical triamcinolone. Encouraged her to keep legs elevated and avoid excess salt.

## 2013-05-13 ENCOUNTER — Other Ambulatory Visit: Payer: Self-pay | Admitting: *Deleted

## 2013-05-13 ENCOUNTER — Other Ambulatory Visit (INDEPENDENT_AMBULATORY_CARE_PROVIDER_SITE_OTHER): Payer: Medicare Other

## 2013-05-13 ENCOUNTER — Telehealth: Payer: Self-pay | Admitting: *Deleted

## 2013-05-13 NOTE — Telephone Encounter (Signed)
What labs and dx?  

## 2013-05-13 NOTE — Telephone Encounter (Signed)
PTH and i Ca, hypercalcemia

## 2013-05-14 LAB — PTH, INTACT AND CALCIUM: Calcium: 10.7 mg/dL — ABNORMAL HIGH (ref 8.4–10.5)

## 2013-05-20 ENCOUNTER — Telehealth: Payer: Self-pay | Admitting: *Deleted

## 2013-05-20 NOTE — Telephone Encounter (Signed)
Reviewed patient lab results with her daughter and was trying to schedule an appointment for next week that was 30 minutes. However I does not see anything for next week for 30 minutes, any suggestions?

## 2013-05-20 NOTE — Telephone Encounter (Signed)
Message copied by Theola Sequin on Thu May 20, 2013  1:01 PM ------      Message from: Ronna Polio A      Created: Fri May 14, 2013  2:23 PM       Labs are consistent with primary hyperparathyroidism. I would like to set up a visit to discuss this week after next. ------

## 2013-05-22 NOTE — Telephone Encounter (Signed)
Just use available slot

## 2013-05-24 NOTE — Telephone Encounter (Signed)
Appointment scheduled for Friday 10/17

## 2013-05-28 ENCOUNTER — Encounter: Payer: Self-pay | Admitting: Internal Medicine

## 2013-05-28 ENCOUNTER — Ambulatory Visit (INDEPENDENT_AMBULATORY_CARE_PROVIDER_SITE_OTHER): Payer: Medicare Other | Admitting: Internal Medicine

## 2013-05-28 VITALS — BP 110/64 | HR 72 | Temp 97.8°F | Wt 164.0 lb

## 2013-05-28 DIAGNOSIS — E210 Primary hyperparathyroidism: Secondary | ICD-10-CM

## 2013-05-28 DIAGNOSIS — R197 Diarrhea, unspecified: Secondary | ICD-10-CM

## 2013-05-28 NOTE — Assessment & Plan Note (Signed)
Reviewed recent labs with patient and her daughter today and diagnosis of primary hyperparathyroidism. Given her history of severe poorly controlled depression, osteoporosis, and chronic fatigue I would favor further workup with hyperparathyroidism. Will set up endocrine evaluation. We discussed that they will likely order a sestamibi scan. She will call if she has any questions or concerns.

## 2013-05-28 NOTE — Assessment & Plan Note (Signed)
Persistent diffuse diarrhea, moderate improvement with use of Imodium. Will request notes on recent evaluation at [facility] including food allergy testing.

## 2013-05-28 NOTE — Progress Notes (Signed)
Subjective:    Patient ID: [name], female    DOB: 01/24/1932, 79 y.o.   MRN: [id number]  HPI 79 year old female with history of depression and chronic diarrhea presents for followup of recent lab work. Recent labs showed mildly elevated calcium. She returned to clinic and had parathyroid hormone level checked which was elevated. She has never previously been diagnosed with hyperparathyroidism. She has suffered from severe ongoing depression, chronic diffuse pain, and fatigue. She recently has had frequent episodes of watery diarrhea, occurring almost every day. She was seen by GI physician and has started some allergy testing. Patient's daughter reports that the GI physician recommended against repeat colonoscopy. She has also started taking Imodium every day with some improvement in diarrhea.  Outpatient Encounter Prescriptions as of 05/28/2013  Medication Sig Dispense Refill  . ARIPiprazole (ABILIFY) 5 MG tablet Take 5 mg by mouth daily.      Marland Kitchen buPROPion (WELLBUTRIN) 100 MG tablet Take 100 mg by mouth daily.      . cholecalciferol (VITAMIN D) 1000 UNITS tablet Take 1,000 Units by mouth daily.      Marland Kitchen escitalopram (LEXAPRO) 20 MG tablet Take 20 mg by mouth daily.      . Multiple Vitamin (MULTIVITAMIN) tablet Take 1 tablet by mouth daily.      Marland Kitchen triamcinolone cream (KENALOG) 0.1 % Apply topically 2 (two) times daily.  454 g  1   No facility-administered encounter medications on file as of 05/28/2013.   BP 110/64  Pulse 72  Temp(Src) 97.8 F (36.6 C) (Oral)  Wt 164 lb (74.39 kg)  BMI 29.06 kg/m2  SpO2 96%  Review of Systems  Constitutional: Positive for fatigue. Negative for fever, chills, appetite change and unexpected weight change.  HENT: Negative for congestion, ear pain, sinus pressure, sore throat, trouble swallowing and voice change.   Eyes: Negative for visual disturbance.  Respiratory: Negative for cough, shortness of breath, wheezing and stridor.   Cardiovascular:  Negative for chest pain, palpitations and leg swelling.  Gastrointestinal: Positive for diarrhea. Negative for nausea, vomiting, abdominal pain, constipation, blood in stool, abdominal distention and anal bleeding.  Genitourinary: Negative for dysuria and flank pain.  Musculoskeletal: Negative for arthralgias, gait problem, myalgias and neck pain.  Skin: Negative for color change and rash.  Neurological: Negative for dizziness and headaches.  Hematological: Negative for adenopathy. Does not bruise/bleed easily.  Psychiatric/Behavioral: Positive for behavioral problems and dysphoric mood. Negative for suicidal ideas and sleep disturbance. The patient is not nervous/anxious.        Objective:   Physical Exam  Constitutional: She is oriented to person, place, and time. She appears well-developed and well-nourished. No distress.  HENT:  Head: Normocephalic and atraumatic.  Right Ear: External ear normal.  Left Ear: External ear normal.  Nose: Nose normal.  Mouth/Throat: Oropharynx is clear and moist. No oropharyngeal exudate.  Eyes: Conjunctivae are normal. Pupils are equal, round, and reactive to light. Right eye exhibits no discharge. Left eye exhibits no discharge. No scleral icterus.  Neck: Normal range of motion. Neck supple. No tracheal deviation present. No thyromegaly present.  Cardiovascular: Normal rate, regular rhythm, normal heart sounds and intact distal pulses.  Exam reveals no gallop and no friction rub.   No murmur heard. Pulmonary/Chest: Effort normal and breath sounds normal. No accessory muscle usage. Not tachypneic. No respiratory distress. She has no decreased breath sounds. She has no wheezes. She has no rhonchi. She has no rales. She exhibits no tenderness.  Musculoskeletal: Normal  range of motion. She exhibits no edema and no tenderness.  Lymphadenopathy:    She has no cervical adenopathy.  Neurological: She is alert and oriented to person, place, and time. No cranial  nerve deficit. She exhibits normal muscle tone. Coordination normal.  Skin: Skin is warm and dry. No rash noted. She is not diaphoretic. No erythema. No pallor.  Psychiatric: Her behavior is normal. Judgment and thought content normal. Her speech is delayed. Cognition and memory are normal. She exhibits a depressed mood.          Assessment & Plan:

## 2013-05-28 NOTE — Patient Instructions (Signed)
Parathyroid Hormone This is a test to determine whether PTH levels are responding normally to changes in blood calcium levels. It also helps to distinguish the cause of calcium imbalances, and to evaluate parathyroid function. When calcium blood levels are higher or lower than normal, and when your caregiver may want to determine how well your parathyroid glands are working. Parathyroid hormone (PTH) helps the body maintain stable levels of calcium in the blood. It is part of a 'feedback loop' that includes calcium, PTH, vitamin D, and, to some extent, phosphate and magnesium. Conditions and diseases that disrupt this feedback loop can cause inappropriate elevations or decreases in calcium and PTH levels and lead to symptoms of hypercalcemia (raised blood levels of calcium) or hypocalcemia (low blood levels of calcium).  PTH is produced by four parathyroid glands that are located in the neck beside the thyroid gland. Normally, these glands secrete PTH into the bloodstream in response to low blood calcium levels. Parathyroid hormone then works in three ways to help raise blood calcium levels back to normal. It takes calcium from the body's bone, stimulates the activation of vitamin D in the kidney (which in turn increases the absorption of calcium from the intestines), and suppresses the excretion of calcium in the urine (while encouraging excretion of phosphate). As calcium levels begin to increase in the blood, PTH normally decreases. PREPARATION FOR TEST You should have nothing to eat or drink except for water after midnight on the day of the test or as directed by your caregiver. A blood sample is obtained by inserting a needle into a vein in the arm. NORMAL FINDINGS Conventional Normal  PTH intact (whole)  Assay includes intact PTH  Values (pg/mL)10-65  SI Units (ng/L)10-65  PTH N-terminalN-terminal  Values (pg/mL) 8-24  SI Units (ng/L)8-24  PTH C-terminal  Assay Includes  C-terminal  Values (pg/mL) 50-330  SI Units (ng/L) 50-330  Intact PTH  Midmolecule Ranges for normal findings may vary among different laboratories and hospitals. You should always check with your doctor after having lab work or other tests done to discuss the meaning of your test results and whether your values are considered within normal limits. MEANING OF TEST  Your caregiver will go over the test results with you and discuss the importance and meaning of your results, as well as treatment options and the need for additional tests if necessary. OBTAINING THE TEST RESULTS  It is your responsibility to obtain your test results. Ask the lab or department performing the test when and how you will get your results. Document Released: 08/31/2004 Document Revised: 10/21/2011 Document Reviewed: 07/10/2008 [employer] Patient Information 2014 [employer]  

## 2013-07-21 ENCOUNTER — Encounter: Payer: Self-pay | Admitting: Internal Medicine

## 2013-07-21 ENCOUNTER — Ambulatory Visit (INDEPENDENT_AMBULATORY_CARE_PROVIDER_SITE_OTHER): Payer: Medicare Other | Admitting: Internal Medicine

## 2013-07-21 VITALS — BP 114/68 | HR 78 | Temp 98.0°F | Resp 10 | Ht 63.0 in | Wt 160.8 lb

## 2013-07-21 DIAGNOSIS — E213 Hyperparathyroidism, unspecified: Secondary | ICD-10-CM

## 2013-07-21 LAB — BASIC METABOLIC PANEL
BUN: 15 mg/dL (ref 6–23)
CO2: 29 mEq/L (ref 19–32)
Chloride: 103 mEq/L (ref 96–112)
Creatinine, Ser: 1 mg/dL (ref 0.4–1.2)
Glucose, Bld: 74 mg/dL (ref 70–99)
Potassium: 4.5 mEq/L (ref 3.5–5.1)

## 2013-07-21 LAB — MAGNESIUM: Magnesium: 2 mg/dL (ref 1.5–2.5)

## 2013-07-21 NOTE — Patient Instructions (Signed)
Please stop at the lab. I will send you the results through MyChart. Please return for another visit in 6 months. Please think about osteoporosis medications (see Prolia info below)..  How Can I Prevent Falls? Men and women with osteoporosis need to take care not to fall down. Falls can break bones. Some reasons people fall are: Poor vision  Poor balance  Certain diseases that affect how you walk  Some types of medicine, such as sleeping pills.  Some tips to help prevent falls outdoors are: Use a cane or walker  Wear rubber-soled shoes so you don't slip  Walk on grass when sidewalks are slippery  In winter, put salt or kitty litter on icy sidewalks.  Some ways to help prevent falls indoors are: Keep rooms free of clutter, especially on floors  Use plastic or carpet runners on slippery floors  Wear low-heeled shoes that provide good support  Do not walk in socks, stockings, or slippers  Be sure carpets and area rugs have skid-proof backs or are tacked to the floor  Be sure stairs are well lit and have rails on both sides  Put grab bars on bathroom walls near tub, shower, and toilet  Use a rubber bath mat in the shower or tub  Keep a flashlight next to your bed  Use a sturdy step stool with a handrail and wide steps  Add more lights in rooms (and night lights) Buy a cordless phone to keep with you so that you don't have to rush to the phone       when it rings and so that you can call for help if you fall.   (adapted from http://www.niams.[employer].at)  Dietary sources of calcium and vitamin D:  Calcium content (mg) - http://www.niams.https://www.gonzalez.org/  Fortified oatmeal, 1 packet 350  Sardines, canned in oil, with edible bones, 3 oz. 324  Cheddar cheese, 1 oz. shredded 306  Milk, nonfat, 1 cup 302  Milkshake, 1 cup 300  Yogurt, plain, low-fat, 1 cup 300  Soybeans, cooked, 1 cup 261  Tofu, firm, with calcium,   cup 204  Orange juice, fortified with calcium, 6 oz. 200-260 (varies)  Salmon, canned, with edible bones, 3 oz. 181  Pudding, instant, made with 2% milk,  cup 153  Baked beans, 1 cup 142  Cottage cheese, 1% milk fat, 1 cup 138  Spaghetti, lasagna, 1 cup 125  Frozen yogurt, vanilla, soft-serve,  cup 103  Ready-to-eat cereal, fortified with calcium, 1 cup 100-1,000 (varies)  Cheese pizza, 1 slice 100  Fortified waffles, 2 100  Turnip greens, boiled,  cup 99  Broccoli, raw, 1 cup 90  Ice cream, vanilla,  cup 85  Soy or rice milk, fortified with calcium, 1 cup 80-500 (varies)   Vitamin D content (International Units, IU) - https://www.ars.usda.gov Cod liver oil, 1 tablespoon 1,360  Swordfish, cooked, 3 oz 566  Salmon (sockeye), cooked, 3 oz 447  Tuna fish, canned in water, drained, 3 oz 154  Orange juice fortified with vitamin D, 1 cup (check product labels, as amount of added vitamin D varies) 137  Milk, nonfat, reduced fat, and whole, vitamin D-fortified, 1 cup 115-124  Yogurt, fortified with 20% of the daily value for vitamin D, 6 oz 80  Margarine, fortified, 1 tablespoon 60  Sardines, canned in oil, drained, 2 sardines 46  Liver, beef, cooked, 3 oz 42  Egg, 1 large (vitamin D is found in yolk) 41  Ready-to-eat cereal, fortified with 10% of the daily value for  vitamin D, 0.75-1 cup  40  Cheese, Swiss, 1 oz 6   I would suggest that you start on the following medication for osteoporosis:  Denosumab: Patient drug information (Up-to-date) Copyright (365)[phone] [employer] All rights reserved.  Brand Names: [place]  ProliaRivka [name] What is this drug used for?  It is used to treat soft, brittle bones (osteoporosis).  It is used for bone growth.  It is used when treating some cancers.  It may be given to you for other reasons. Talk with the doctor. What do I need to tell my doctor BEFORE I take this drug?  All products:  If you have an allergy to denosumab or any other part  of this drug.  If you are allergic to any drugs like this one, any other drugs, foods, or other substances. Tell your doctor about the allergy and what signs you had, like rash; hives; itching; shortness of breath; wheezing; cough; swelling of face, lips, tongue, or throat; or any other signs.  If you have low calcium levels.  Prolia:  If you are pregnant or may be pregnant. Do not take this drug if you are pregnant.  This is not a list of all drugs or health problems that interact with this drug.  Tell your doctor and pharmacist about all of your drugs (prescription or OTC, natural products, vitamins) and health problems. You must check to make sure that it is safe for you to take this drug with all of your drugs and health problems. Do not start, stop, or change the dose of any drug without checking with your doctor. What are some things I need to know or do while I take this drug?  All products:  Tell dentists, surgeons, and other doctors that you use this drug.  This drug may raise the chance of a broken leg. Talk with your doctor.  Have your blood work checked. Talk with your doctor.  Have a bone density test. Talk with your doctor.  Take calcium and vitamin D as you were told by your doctor.  Have a dental exam before starting this drug.  Take good care of your teeth. See a dentist often.  If you smoke, talk with your doctor.  Do not give to a child. Talk with your doctor.  Tell your doctor if you are breast-feeding. You will need to talk about any risks to your baby.  [name]:  This drug may cause harm to the unborn baby if you take it while you are pregnant. If you get pregnant while taking this drug, call your doctor right away.  Prolia:  Very bad infections have been reported with use of this drug. If you have any infection, are taking antibiotics now or in the recent past, or have many infections, talk with your doctor.  You may have more chance of getting an  infection. Wash hands often. Stay away from people with infections, colds, or flu.  Use birth control that you can trust to prevent pregnancy while taking this drug.  If you are a man and your sex partner is pregnant or gets pregnant at any time while you are being treated, talk with your doctor. What are some side effects that I need to call my doctor about right away?  WARNING/CAUTION: Even though it may be rare, some people may have very bad and sometimes deadly side effects when taking a drug. Tell your doctor or get medical help right away if you have any of the  following signs or symptoms that may be related to a very bad side effect:  All products:  Signs of an allergic reaction, like rash; hives; itching; red, swollen, blistered, or peeling skin with or without fever; wheezing; tightness in the chest or throat; trouble breathing or talking; unusual hoarseness; or swelling of the mouth, face, lips, tongue, or throat.  Signs of low calcium levels like muscle cramps or spasms, numbness and tingling, or seizures.  Mouth sores.  Any new or strange groin, hip, or thigh pain.  This drug may cause jawbone problems. The chance may be higher the longer you take this drug. The chance may be higher if you have cancer, dental problems, dentures that do not fit well, anemia, blood clotting problems, or an infection. The chance may also be higher if you are having dental work or if you are getting chemo, some steroid drugs, or radiation. Call your doctor right away if you have jaw swelling or pain.  Xgeva:  Not hungry.  Muscle pain or weakness.  Seizures.  Shortness of breath.  Prolia:  Signs of infection. These include a fever of 100.53F (38C) or higher, chills, very bad sore throat, ear or sinus pain, cough, more sputum or change in color of sputum, pain with passing urine, mouth sores, wound that will not heal, or anal itching or pain.  Signs of a pancreas problem (pancreatitis) like  very bad stomach pain, very bad back pain, or very bad upset stomach or throwing up.  Chest pain.  A heartbeat that does not feel normal.  Very bad skin irritation.  Feeling very tired or weak.  Bladder pain or pain when passing urine or change in how much urine is passed.  Passing urine often.  Swelling in the arms or legs. What are some other side effects of this drug?  All drugs may cause side effects. However, many people have no side effects or only have minor side effects. Call your doctor or get medical help if any of these side effects or any other side effects bother you or do not go away:  Xgeva:  Feeling tired or weak.  Headache.  Upset stomach or throwing up.  Loose stools (diarrhea).  Cough.  Prolia:  Back pain.  Muscle or joint pain.  Sore throat.  Runny nose.  Pain in arms or legs.  These are not all of the side effects that may occur. If you have questions about side effects, call your doctor. Call your doctor for medical advice about side effects.  You may report side effects to your national health agency. How is this drug best taken?  Use this drug as ordered by your doctor. Read and follow the dosing on the label closely.  It is given as a shot into the fatty part of the skin. What do I do if I miss a dose?  Call the doctor to find out what to do. How do I store and/or throw out this drug?  This drug will be given to you in a hospital or doctor's office. You will not store it at home.  Keep all drugs out of the reach of children and pets.  Check with your pharmacist about how to throw out unused drugs.  General drug facts  If your symptoms or health problems do not get better or if they become worse, call your doctor.  Do not share your drugs with others and do not take anyone else's drugs.  Keep a list of all your  drugs (prescription, natural products, vitamins, OTC) with you. Give this list to your doctor.  Talk with the doctor  before starting any new drug, including prescription or OTC, natural products, or vitamins.  Some drugs may have another patient information leaflet. If you have any questions about this drug, please talk with your doctor, pharmacist, or other health care provider.  If you think there has been an overdose, call your poison control center or get medical care right away. Be ready to tell or show what was taken, how much, and when it happened.

## 2013-07-21 NOTE — Progress Notes (Signed)
Patient ID: [name], female   DOB: July 01, 1932, 79 y.o.   MRN: [id number]   HPI  [name] is a 79 y.o.-year-old female, referred by her PCP, Dr. Dan Humphreys, for evaluation for hypercalcemia/hyperparathyroidism. She is here with her daughter who provides most of the history.  Pt was dx with hypercalcemia in 04/2013. I reviewed pt's pertinent labs: Lab Results  Component Value Date   PTH 76.6* 05/13/2013   CALCIUM 10.7* 05/13/2013   CALCIUM 10.6* 05/07/2013   CALCIUM 10.3 11/11/2012   CALCIUM 10.5 06/29/2012   CALCIUM 10.4 12/25/2011   No h/o vitamin D deficiency. No vit D levels available for review.  I reviewed pt's DEXA scans: Date L1-L4 T score FN T score 33% distal Radius  05/23/2010 -1.5 LFN: - 1.1 RFN: -1.2 -2.8  03/10/2007 -2.0 LFN: -1.2 RFN: -1.1 -2.9   She had a humeral fracture in 09/2012, after she tripped and fell. She uses a cane to keep her balance.   No h/o kidney stones.  TSH levels not abnormal. Lab Results  Component Value Date   TSH 2.62 05/07/2013   Pt has mild CKD. Last BUN/Cr: Lab Results  Component Value Date   BUN 15 05/07/2013   CREATININE 1.0 05/07/2013  Estimated Creatinine Clearance: 43.6 ml/min (by C-G formula based on Cr of 1).  Pt is on vitamin D 2000 IU - most days; she is not taking calcium supplements she also eats dairy (1 cup of milk per day  = 300 mg, some cheese) and not a lot of green, leafy, vegetables. No weight bearing exercises.   Pt does not have a FH of hypercalcemia, pituitary tumors, thyroid cancer, but mother had osteoporosis. Daughter had a kidney stone.  ROS: Constitutional: no weight gain/loss, + fatigue, + subjective hypothermia, + poor sleep Eyes: no blurry vision, no xerophthalmia ENT: no sore throat, no nodules palpated in throat,+ dysphagia/no odynophagia, no hoarseness, + hypoacusis Cardiovascular: no CP/SOB/palpitations/+ leg swelling Respiratory: no cough/+ SOB Gastrointestinal: no N/V/+ D/no C Musculoskeletal:  no muscle/+ joint aches Skin: no rashes, + hair loss Neurological: no tremors/numbness/tingling/dizziness Psychiatric: + Depression/+ anxiety  Past Medical History  Diagnosis Date  . Depression    Past Surgical History  Procedure Laterality Date  . Tonsillectomy    . Endometrial ablation    . Eye surgery  2011    left cataract   History   Social History  . Marital Status: Legally Separated    Spouse Name: N/A    Number of Children: 5   Occupational History  . Not on file.   Social History Main Topics  . Smoking status: Never Smoker   . Smokeless tobacco: Never Used  . Alcohol Use: No  . Drug Use: No   Social History Narrative   Regular exercise: not really   Caffeine use: some   Current Outpatient Prescriptions on File Prior to Visit  Medication Sig Dispense Refill  . ARIPiprazole (ABILIFY) 5 MG tablet Take 5 mg by mouth daily.      Marland Kitchen buPROPion (WELLBUTRIN) 100 MG tablet Take 100 mg by mouth daily.      . cholecalciferol (VITAMIN D) 1000 UNITS tablet Take 1,000 Units by mouth daily.      Marland Kitchen escitalopram (LEXAPRO) 20 MG tablet Take 20 mg by mouth daily.      . Multiple Vitamin (MULTIVITAMIN) tablet Take 1 tablet by mouth daily.      Marland Kitchen triamcinolone cream (KENALOG) 0.1 % Apply topically 2 (two) times daily.  454 g  1   No current facility-administered medications on file prior to visit.   Allergies  Allergen Reactions  . Codeine Other (See Comments)    Crying spells  . Darvon [Propoxyphene Hcl] Other (See Comments)    Crying spells   Family History  Problem Relation Age of Onset  . Cancer Mother 38    ovarian   . Pernicious anemia Mother   . COPD Father     nonsmoker  . Cancer Cousin     ovarian ca   PE: BP 114/68  Pulse 78  Temp(Src) 98 F (36.7 C) (Oral)  Resp 10  Ht 5\' 3"  (1.6 m)  Wt 160 lb 12.8 oz (72.938 kg)  BMI 28.49 kg/m2  SpO2 95% Wt Readings from Last 3 Encounters:  07/21/13 160 lb 12.8 oz (72.938 kg)  05/28/13 164 lb (74.39 kg)   05/07/13 163 lb (73.936 kg)   Constitutional: overweight, in NAD. No kyphosis. Flat affect, daughter provides most info. Eyes: PERRLA, EOMI, no exophthalmos ENT: moist mucous membranes, no thyromegaly, no cervical lymphadenopathy Cardiovascular: RRR, No MRG Respiratory: CTA B Gastrointestinal: abdomen soft, NT, ND, BS+ Musculoskeletal: no deformities, strength intact in all 4 Skin: moist, warm, no rashes Neurological: no tremor with outstretched hands, DTR normal in all 4  Assessment: 1. Hypercalcemia/hyperparathyroidism  Plan: Patient has had 2 instances of mildly elevated calcium, with the highest level being at 10.7. A corresponding intact PTH level was also high, at 76.6. 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  She does have several possible complications from hypercalcemia: has osteoporosis with a low scores at the level of the radius, also had a humeral fracture. She also has severe depression. No abdominal pain, h/o nephrolithiasis. She does have mild kidney dysfunction. - I discussed with the patient and her daughter about the physiology of calcium and parathyroid hormone, and possible side effects from increased PTH, including kidney stones, osteoporosis, etc.  - we discussed about criteria for parathyroid surgery:  Increased calcium by more than 1 mg/dL above the upper limit of normal  Kidney ds. Or urinary calcium more than 400 mg/24 hours Osteoporosis  Age <77 years old - she does meet the criteria since she has osteoporosis at the 33% distal radius for evaluation of cortical bone. However, at 79 years old, and with mildly elevated calcium, my concern is that we might do more harm with surgery. The daughter agreed with this. I explained that we have the option of keeping the night on her calcium and also adding medications to improve her  bone status. Probably is probably the best option is Prolia. I explained about the medication and gave them materials to read. She has contraindications to by mouth bisphosphonates due to GERD, and also to IV bisphosphonates due to mild kidney dysfunction. With a high PTH is not a candidate for teriparatide. They are reticent to try any medicines for now, but will read the information and let me know. In the meantime, I gave him materials about improving her calcium and vitamin D in the diet, and advised him to make sure that she gets approximately 1200 mg of calcium and >1000 units of vitamin D daily. - I will also check: calcium level intact PTH Magnesium Phosphorus vitamin D We might also need a calcium/creatinine ratio, but I do not feel that this is helpful for  now. - I will wait for the results of the above labs and will discuss with the plan with the patient.  - I will see the patient back in 6 months, I will discuss through my chart or by phone. I advised him that they can always change their mind regarding the surgery, and they would need to let me know if they decide to go with this. Patient and daughter are pleased with the plan.  Office Visit on 07/21/2013  Component Date Value Range Status  . Vit D, 25-Hydroxy 07/21/2013 29* 30 - 89 ng/mL Final   Comment: This assay accurately quantifies Vitamin D, which is the sum of the                          25-Hydroxy forms of Vitamin D2 and D3.  Studies have shown that the                          optimum concentration of 25-Hydroxy Vitamin D is 30 ng/mL or higher.                           Concentrations of Vitamin D between 20 and 29 ng/mL are considered to                          be insufficient and concentrations less than 20 ng/mL are considered                          to be deficient for Vitamin D.  . PTH 07/21/2013 64.6  14.0 - 72.0 pg/mL Final  . Sodium 07/21/2013 139  135 - 145 mEq/L Final  . Potassium 07/21/2013 4.5  3.5 - 5.1  mEq/L Final  . Chloride 07/21/2013 103  96 - 112 mEq/L Final  . CO2 07/21/2013 29  19 - 32 mEq/L Final  . Glucose, Bld 07/21/2013 74  70 - 99 mg/dL Final  . BUN 69/62/9528 15  6 - 23 mg/dL Final  . Creatinine, Ser 07/21/2013 1.0  0.4 - 1.2 mg/dL Final  . Calcium 41/32/4401 10.6* 8.4 - 10.5 mg/dL Final  . GFR 02/72/5366 57.46* >60.00 mL/min Final  . Magnesium 07/21/2013 2.0  1.5 - 2.5 mg/dL Final  . Phosphorus 44/10/4740 2.6  2.3 - 4.6 mg/dL Final   Increase vit D to 3000 units daily. OTW, continue above plan

## 2013-08-09 ENCOUNTER — Encounter: Payer: Self-pay | Admitting: Adult Health

## 2013-08-09 ENCOUNTER — Ambulatory Visit (INDEPENDENT_AMBULATORY_CARE_PROVIDER_SITE_OTHER): Payer: Medicare Other | Admitting: Adult Health

## 2013-08-09 VITALS — BP 110/60 | HR 70 | Temp 97.8°F | Resp 12 | Wt 159.5 lb

## 2013-08-09 DIAGNOSIS — B351 Tinea unguium: Secondary | ICD-10-CM | POA: Insufficient documentation

## 2013-08-09 NOTE — Progress Notes (Signed)
   Subjective:    Patient ID: [name], female    DOB: 1931/09/12, 79 y.o.   MRN: [id number]  HPI  Patient is a pleasant 79 year old female who presents to clinic with onychomycosis of multiple toenails, thickening of toenails making it very difficult to trim and ingrown toenail on the right great toe. She reports she has not seen a specialist to help trim her nails. Her daughter has been doing the best that she can however she reports the nails are very hard to cut.  Current Outpatient Prescriptions on File Prior to Visit  Medication Sig Dispense Refill  . ARIPiprazole (ABILIFY) 5 MG tablet Take 5 mg by mouth daily.      Marland Kitchen buPROPion (WELLBUTRIN) 100 MG tablet Take 100 mg by mouth daily.      . cholecalciferol (VITAMIN D) 1000 UNITS tablet Take 1,000 Units by mouth daily.      Marland Kitchen escitalopram (LEXAPRO) 20 MG tablet Take 20 mg by mouth daily.      Marland Kitchen omeprazole (PRILOSEC) 20 MG capsule       . triamcinolone cream (KENALOG) 0.1 % Apply topically 2 (two) times daily.  454 g  1  . Multiple Vitamin (MULTIVITAMIN) tablet Take 1 tablet by mouth daily.       No current facility-administered medications on file prior to visit.     Review of Systems Positive for very lengthy toe nails, pain on the lateral corner of right great toe Negative for s/s of infection, drainage or wound    Objective:   Physical Exam  Skin:  Onychomycosis of multiple toe nails. Long, thick toe nails 2/2 to fungus  Psychiatric: She has a normal mood and affect. Her behavior is normal. Judgment and thought content normal.          Assessment & Plan:

## 2013-08-09 NOTE — Progress Notes (Signed)
Pre visit review using our clinic review tool, if applicable. No additional management support is needed unless otherwise documented below in the visit note. 

## 2013-08-09 NOTE — Assessment & Plan Note (Signed)
right great toe nail released from skin. There is no sign of infection. Patient needs to be referred to podiatry. Instructed daughter to pull on the corner of skin until nail is able to grow past the corner where the edge seems to be embedding in.

## 2013-08-20 ENCOUNTER — Ambulatory Visit (INDEPENDENT_AMBULATORY_CARE_PROVIDER_SITE_OTHER): Payer: Medicare Other | Admitting: Podiatry

## 2013-08-20 ENCOUNTER — Encounter: Payer: Self-pay | Admitting: Podiatry

## 2013-08-20 VITALS — BP 111/57 | HR 77 | Resp 16 | Ht 63.0 in | Wt 158.0 lb

## 2013-08-20 DIAGNOSIS — L600 Ingrowing nail: Secondary | ICD-10-CM

## 2013-08-20 DIAGNOSIS — B351 Tinea unguium: Secondary | ICD-10-CM

## 2013-08-20 DIAGNOSIS — M79609 Pain in unspecified limb: Secondary | ICD-10-CM

## 2013-08-20 NOTE — Progress Notes (Signed)
   Subjective:    Patient ID: [name], female    DOB: Feb 13, 1932, 80 y.o.   MRN: [id number]  HPI Comments: N pain L great toenail right ingrown D 2-3 weeks O sudden C better A pressure  T pts daughter trims toenails     Review of Systems  Constitutional: Negative.   HENT: Positive for trouble swallowing.   Eyes: Negative.   Respiratory: Positive for shortness of breath.   Cardiovascular: Negative.   Gastrointestinal: Positive for diarrhea.  Endocrine: Negative.   Genitourinary: Negative.   Musculoskeletal:       Joint pain Difficulty walking  Skin:       Change in nails  Neurological: Negative.   Hematological: Negative.   Psychiatric/Behavioral: The patient is nervous/anxious.        Objective:   Physical Exam        Assessment & Plan:

## 2013-08-20 NOTE — Patient Instructions (Signed)
Long Term Care Instructions-Post Nail Surgery  You have had your ingrown toenail and root treated with a chemical.  This chemical causes a burn that will drain and ooze like a blister.  This can drain for 6-8 weeks or longer.  It is important to keep this area clean, covered, and follow the soaking instructions dispensed at the time of your surgery.  This area will eventually dry and form a scab.  Once the scab forms you no longer need to soak or apply a dressing.  If at any time you experience an increase in pain, redness, swelling, or drainage, you should contact the office as soon as possible.ANTIBACTERIAL SOAP INSTRUCTIONS  THE DAY AFTER PROCEDURE  Please follow the instructions your doctor has marked.   Shower as usual. Before getting out, place a drop of antibacterial liquid soap (Dial) on a wet, clean washcloth.  Gently wipe washcloth over affected area.  Afterward, rinse the area with warm water.  Blot the area dry with a soft cloth and cover with antibiotic ointment (neosporin, polysporin, bacitracin) and band aid or gauze and tape  Place 3-4 drops of antibacterial liquid soap in a quart of warm tap water.  Submerge foot into water for 20 minutes.  If bandage was applied after your procedure, leave on to allow for easy lift off, then remove and continue with soak for the remaining time.  Next, blot area dry with a soft cloth and cover with a bandage.  Apply other medications as directed by your doctor, such as cortisporin otic solution (eardrops) or neosporin antibiotic ointment 

## 2013-08-22 NOTE — Progress Notes (Signed)
Subjective:     Patient ID: [name], female   DOB: April 21, 1932, 80 y.o.   MRN: [id number]  HPI patient presents with chronic ingrown toenail of the right foot medial border and nail disease of both feet. Presents with caregiver who states she cannot cut these painful nails   Review of Systems  All other systems reviewed and are negative.       Objective:   Physical Exam  Nursing note and vitals reviewed. Constitutional: She is oriented to person, place, and time.  Cardiovascular: Intact distal pulses.   Musculoskeletal: Normal range of motion.  Neurological: She is oriented to person, place, and time.  Skin: Skin is warm.   neurovascular status checked and found to be intact with normal muscle strength and range of motion loss of subtalar and midtarsal joint. Right hallux medial border is quite incurvated and tender when pressed and all nails are thickened and painful when pressed with inability for patient to trim herself or caregiver     Assessment:     Ingrown toenail deformity of the right hallux medial border chronic nature and nail disease 1-5 both feet with thickness and pain    Plan:     H&P and condition reviewed. I have recommended removal of the corner and explained risk of healing of which patient and caregiver are aware want to procedure I infiltrated 60 mg Xylocaine Marcaine mixture remove the medial border exposed matrix and applied chemical phenol followed by alcohol lavaged and sterile dressing. Debrided remaining nails 1-5 both feet and instructed on soaks of the right foot

## 2013-11-05 ENCOUNTER — Ambulatory Visit (INDEPENDENT_AMBULATORY_CARE_PROVIDER_SITE_OTHER): Payer: Medicare Other | Admitting: Internal Medicine

## 2013-11-05 ENCOUNTER — Encounter: Payer: Self-pay | Admitting: Internal Medicine

## 2013-11-05 VITALS — BP 110/70 | HR 71 | Temp 97.8°F | Wt 170.0 lb

## 2013-11-05 DIAGNOSIS — R197 Diarrhea, unspecified: Secondary | ICD-10-CM

## 2013-11-05 DIAGNOSIS — F32A Depression, unspecified: Secondary | ICD-10-CM

## 2013-11-05 DIAGNOSIS — E210 Primary hyperparathyroidism: Secondary | ICD-10-CM

## 2013-11-05 DIAGNOSIS — F329 Major depressive disorder, single episode, unspecified: Secondary | ICD-10-CM

## 2013-11-05 DIAGNOSIS — F3289 Other specified depressive episodes: Secondary | ICD-10-CM

## 2013-11-05 LAB — COMPREHENSIVE METABOLIC PANEL
ALT: 15 U/L (ref 0–35)
AST: 15 U/L (ref 0–37)
Albumin: 4 g/dL (ref 3.5–5.2)
Alkaline Phosphatase: 70 U/L (ref 39–117)
BILIRUBIN TOTAL: 0.5 mg/dL (ref 0.3–1.2)
BUN: 18 mg/dL (ref 6–23)
CO2: 28 mEq/L (ref 19–32)
Calcium: 11.1 mg/dL — ABNORMAL HIGH (ref 8.4–10.5)
Chloride: 103 mEq/L (ref 96–112)
Creatinine, Ser: 1 mg/dL (ref 0.4–1.2)
GFR: 55.47 mL/min — ABNORMAL LOW (ref >60.00)
Glucose, Bld: 75 mg/dL (ref 70–99)
Potassium: 4.6 mEq/L (ref 3.5–5.1)
SODIUM: 137 meq/L (ref 135–145)
TOTAL PROTEIN: 6.9 g/dL (ref 6.0–8.3)

## 2013-11-05 NOTE — Patient Instructions (Signed)
Try using Align probiotic to help with symptoms of diarrhea.  We will check labs today.  Follow up 3 months and prn.

## 2013-11-05 NOTE — Assessment & Plan Note (Addendum)
Reviewed notes from Dr. Cruzita Lederer. Will recheck Ca and PTH with labs today.

## 2013-11-05 NOTE — Progress Notes (Signed)
Subjective:    Patient ID: [name], female    DOB: [birth year], 80 y.o.   MRN: [id number]  HPI 80YO female presents for follow up.  Depression - Symptoms well controlled on current medications. No concerns today.  Diarrhea - controlled with occasional over the counter imodium. Tested for C. Diff by Dr. Rayann Heman in 05/2013 this was positive, treated with Flagyl. Continues to have several soft stools daily. Pt daughter would like pt to start Pro-biotic. Pt declines. No abdominal pain, fever, chills, change in appetite.   Review of Systems  Constitutional: Negative for fever, chills, appetite change, fatigue and unexpected weight change.  HENT: Negative for congestion, ear pain, sinus pressure, sore throat, trouble swallowing and voice change.   Eyes: Negative for visual disturbance.  Respiratory: Negative for cough, shortness of breath, wheezing and stridor.   Cardiovascular: Negative for chest pain, palpitations and leg swelling.  Gastrointestinal: Positive for diarrhea. Negative for nausea, vomiting, abdominal pain, constipation, blood in stool, abdominal distention and anal bleeding.  Genitourinary: Negative for dysuria and flank pain.  Musculoskeletal: Negative for arthralgias, gait problem, myalgias and neck pain.  Skin: Negative for color change and rash.  Neurological: Negative for dizziness and headaches.  Hematological: Negative for adenopathy. Does not bruise/bleed easily.  Psychiatric/Behavioral: Negative for suicidal ideas, behavioral problems, sleep disturbance, dysphoric mood and decreased concentration. The patient is not nervous/anxious.        Objective:    BP 110/70  Pulse 71  Temp(Src) 97.8 F (36.6 C) (Oral)  Wt 170 lb (77.111 kg)  SpO2 98% Physical Exam  Constitutional: She is oriented to person, place, and time. She appears well-developed and well-nourished. No distress.  HENT:  Head: Normocephalic and atraumatic.  Right Ear: External ear normal.  Left  Ear: External ear normal.  Nose: Nose normal.  Mouth/Throat: Oropharynx is clear and moist. No oropharyngeal exudate.  Eyes: Conjunctivae are normal. Pupils are equal, round, and reactive to light. Right eye exhibits no discharge. Left eye exhibits no discharge. No scleral icterus.  Neck: Normal range of motion. Neck supple. No tracheal deviation present. No thyromegaly present.  Cardiovascular: Normal rate, regular rhythm, normal heart sounds and intact distal pulses.  Exam reveals no gallop and no friction rub.   No murmur heard. Pulmonary/Chest: Effort normal and breath sounds normal. No accessory muscle usage. Not tachypneic. No respiratory distress. She has no decreased breath sounds. She has no wheezes. She has no rhonchi. She has no rales. She exhibits no tenderness.  Abdominal: Soft. Bowel sounds are normal. She exhibits no distension. There is no tenderness.  Musculoskeletal: Normal range of motion. She exhibits no edema and no tenderness.  Lymphadenopathy:    She has no cervical adenopathy.  Neurological: She is alert and oriented to person, place, and time. No cranial nerve deficit. She exhibits normal muscle tone. Coordination normal.  Skin: Skin is warm and dry. No rash noted. She is not diaphoretic. No erythema. No pallor.  Psychiatric: She has a normal mood and affect. Her behavior is normal. Judgment and thought content normal.          Assessment & Plan:   Problem List Items Addressed This Visit   Depression     Symptoms stable on current medications. Will continue. Continue psychiatric care.    Diarrhea     Chronic diarrhea. Reviewed notes from GI, in 05/2013, pt treated for Cdiff with Flagyl. No follow up test noted. Will repeat stool testing for Cdiff.  (however pt declines  to perform test today, she will discuss with her daughter). Follow up 3 months and prn.    Relevant Orders      Stool C-Diff Toxin Assay      Stool Culture   Primary hyperparathyroidism -  Primary     Reviewed notes from Dr. Cruzita Lederer. Will recheck Ca and PTH with labs today.    Relevant Orders      Comp Met (CMET)      PTH, Intact and Calcium      Vitamin D (25 hydroxy)       Return in about 3 months (around 02/05/2014) for Recheck.

## 2013-11-05 NOTE — Assessment & Plan Note (Signed)
Chronic diarrhea. Reviewed notes from GI, in 05/2013, pt treated for Cdiff with Flagyl. No follow up test noted. Will repeat stool testing for Cdiff.  (however pt declines to perform test today, she will discuss with her daughter). Follow up 3 months and prn.

## 2013-11-05 NOTE — Progress Notes (Signed)
Pre visit review using our clinic review tool, if applicable. No additional management support is needed unless otherwise documented below in the visit note. 

## 2013-11-05 NOTE — Assessment & Plan Note (Signed)
Symptoms stable on current medications. Will continue. Continue psychiatric care.

## 2013-11-06 LAB — VITAMIN D 25 HYDROXY (VIT D DEFICIENCY, FRACTURES): VIT D 25 HYDROXY: 33 ng/mL (ref 30–89)

## 2013-11-08 LAB — PTH, INTACT AND CALCIUM
Calcium: 11.2 mg/dL — ABNORMAL HIGH (ref 8.4–10.5)
PTH: 69.4 pg/mL (ref 14.0–72.0)

## 2013-11-19 ENCOUNTER — Ambulatory Visit: Payer: Medicare Other | Admitting: Podiatry

## 2013-11-26 ENCOUNTER — Ambulatory Visit (INDEPENDENT_AMBULATORY_CARE_PROVIDER_SITE_OTHER): Payer: Medicare Other | Admitting: Podiatry

## 2013-11-26 VITALS — Resp 16 | Ht 63.0 in | Wt 170.0 lb

## 2013-11-26 DIAGNOSIS — M79609 Pain in unspecified limb: Secondary | ICD-10-CM

## 2013-11-26 DIAGNOSIS — B351 Tinea unguium: Secondary | ICD-10-CM

## 2013-11-28 NOTE — Progress Notes (Signed)
Subjective:     Patient ID: [name], female   DOB: 02-23-1932, 80 y.o.   MRN: [id number]  HPI patient presents with nail disease 1-5 both feet that are thick and difficult for her to cut   Review of Systems     Objective:   Physical Exam Neurovascular status intact with mycotic thick nailbeds 1-5 both feet that are painful when pressed    Assessment:     Mycotic nail infection 1-5 both feet with pain    Plan:     Debridement painful nailbeds 1-5 both feet with no iatrogenic bleeding noted

## 2013-12-27 ENCOUNTER — Telehealth: Payer: Self-pay | Admitting: Internal Medicine

## 2013-12-27 NOTE — Telephone Encounter (Signed)
Please make sure this pt has 58min visit.

## 2013-12-27 NOTE — Telephone Encounter (Signed)
Patient Information:  Caller Name: [name]  Phone: [phone]  Patient: [name], [name]  Gender: Female  DOB: 05-Jan-1932  Age: 80 Years  PCP: Ronette Deter (Adults only)  Office Follow Up:  Does the office need to follow up with this patient?: No  Instructions For The Office: N/A   Symptoms  Reason For Call & Symptoms: For past year, pt states she has pain in uterus (lower abdomen) when moving, worse at times than others.  12/25/13 pain was worse than normal, rated pain at an 8/10, felt swollen in abdomen.   12/27/13 pain is improved, only hurts with movement and rates 6/10 at that time, sitting still now and has no pain.  Pt has chronic diarrhea, no changes, Dr Gilford Rile is aware of this.  No pain/burning with urination, slow to start but this is not new.  [place].   Daughter had pt on phone also during triage, advised them of need for appt today, daughter states they already made an appt for 12/29/13, offered appt today at 1615 with Raquel Ray, NP, but pt denies and prefers to wait until 12/29/13.   Care advice given with callback perimeters.  Reviewed Health History In EMR: Yes  Reviewed Medications In EMR: Yes  Reviewed Allergies In EMR: Yes  Reviewed Surgeries / Procedures: Yes  Date of Onset of Symptoms: 12/25/2013  Guideline(s) Used:  Abdominal Pain - Female  Disposition Per Guideline:   See Today in Office  Reason For Disposition Reached:   Moderate or mild pain that comes and goes (cramps) lasts > 24 hours  Advice Given:  Rest:  Lie down and rest until you feel better.  Fluids:  Sip clear fluids only (e.g., water, flat soft drinks or 1/2 strength fruit juice) until the pain has been gone for over 2 hours. Then slowly return to a regular diet.  Call Back If:  Abdominal pain is constant and present for more than 2 hours  You become worse.  Patient Refused Recommendation:  Patient Refused Appt, Patient Requests Appt At Later Date  Pt refused appt today and prefers to wait  until 12/29/13 to be seen (appt already has been scheduled)

## 2013-12-27 NOTE — Telephone Encounter (Signed)
FYI

## 2013-12-29 ENCOUNTER — Ambulatory Visit (INDEPENDENT_AMBULATORY_CARE_PROVIDER_SITE_OTHER): Payer: Medicare Other | Admitting: Internal Medicine

## 2013-12-29 ENCOUNTER — Encounter: Payer: Self-pay | Admitting: Internal Medicine

## 2013-12-29 ENCOUNTER — Ambulatory Visit: Payer: Medicare Other | Admitting: Adult Health

## 2013-12-29 VITALS — BP 100/68 | HR 69 | Temp 98.5°F | Ht 63.0 in | Wt 169.2 lb

## 2013-12-29 DIAGNOSIS — R109 Unspecified abdominal pain: Secondary | ICD-10-CM

## 2013-12-29 DIAGNOSIS — IMO0002 Reserved for concepts with insufficient information to code with codable children: Secondary | ICD-10-CM

## 2013-12-29 DIAGNOSIS — N8111 Cystocele, midline: Secondary | ICD-10-CM

## 2013-12-29 NOTE — Assessment & Plan Note (Signed)
Pt with cystocele noted on exam today. Will set up GYN evaluation for possible repair.

## 2013-12-29 NOTE — Progress Notes (Signed)
Subjective:    Patient ID: [name], female    DOB: 1932/01/12, 80 y.o.   MRN: [id number]  HPI 80YO female presents for acute visit.  Abdominal pain - Feels like like "too much inside" abdomen, pressure in lower abdomen. On Saturday, it hurt to move around. Described as severe pain. However, pain has been ongoing for a year.  No constipation. Has had intermittent watery diarrhea for years. No urinary symptoms. Saturday had some clear vaginal discharge, no vaginal bleeding. No nausea, vomiting, fever. Known to have uterine fibroid. Pt mother had h/o ovarian cancer.  Review of Systems  Constitutional: Negative for fever, chills and fatigue.  Gastrointestinal: Positive for abdominal pain, diarrhea and abdominal distention. Negative for nausea, vomiting, constipation, blood in stool, anal bleeding and rectal pain.  Genitourinary: Positive for vaginal discharge and pelvic pain. Negative for dysuria, urgency, frequency, hematuria, flank pain, decreased urine volume, vaginal bleeding, difficulty urinating, genital sores and vaginal pain.       Objective:    BP 100/68  Pulse 69  Temp(Src) 98.5 F (36.9 C) (Oral)  Ht 5\' 3"  (1.6 m)  Wt 169 lb 4 oz (76.771 kg)  BMI 29.99 kg/m2  SpO2 96% Physical Exam  Constitutional: She is oriented to person, place, and time. She appears well-developed and well-nourished. No distress.  HENT:  Head: Normocephalic and atraumatic.  Right Ear: External ear normal.  Left Ear: External ear normal.  Nose: Nose normal.  Mouth/Throat: Oropharynx is clear and moist.  Eyes: Conjunctivae are normal. Pupils are equal, round, and reactive to light. Right eye exhibits no discharge. Left eye exhibits no discharge. No scleral icterus.  Neck: Normal range of motion. Neck supple. No tracheal deviation present. No thyromegaly present.  Pulmonary/Chest: Effort normal. No accessory muscle usage. Not tachypneic. She has no decreased breath sounds. She has no rhonchi.    Genitourinary: Uterus normal. There is no rash, tenderness, lesion or injury on the right labia. There is no rash, tenderness, lesion or injury on the left labia. Cervix exhibits no motion tenderness and no discharge. Right adnexum displays no mass, no tenderness and no fullness. Left adnexum displays no mass, no tenderness and no fullness.  Cystocele  Musculoskeletal: Normal range of motion. She exhibits no edema and no tenderness.  Lymphadenopathy:    She has no cervical adenopathy.  Neurological: She is alert and oriented to person, place, and time. No cranial nerve deficit. She exhibits normal muscle tone. Coordination normal.  Skin: Skin is warm and dry. No rash noted. She is not diaphoretic. No erythema. No pallor.  Psychiatric: She has a normal mood and affect. Her behavior is normal. Judgment and thought content normal.          Assessment & Plan:   Problem List Items Addressed This Visit     Unprioritized   Abdominal pain, unspecified site - Primary     Intermittent lower abdominal/pelvic pain for years, much worse this weekend. Suspect that cystocele is playing a roll. However, she does have chronic diarrhea, and may have component of colitis or diverticulitis. Will set up CT abdomen and pelvis for further evaluation. Follow up in 4 weeks and prn.    Relevant Orders      CT Abdomen Pelvis W Contrast      Ambulatory referral to Urogynecology   Cystocele     Pt with cystocele noted on exam today. Will set up GYN evaluation for possible repair.        Return in  about 4 weeks (around 01/26/2014).

## 2013-12-29 NOTE — Assessment & Plan Note (Signed)
Intermittent lower abdominal/pelvic pain for years, much worse this weekend. Suspect that cystocele is playing a roll. However, she does have chronic diarrhea, and may have component of colitis or diverticulitis. Will set up CT abdomen and pelvis for further evaluation. Follow up in 4 weeks and prn.

## 2013-12-29 NOTE — Progress Notes (Signed)
Pre visit review using our clinic review tool, if applicable. No additional management support is needed unless otherwise documented below in the visit note. 

## 2013-12-30 ENCOUNTER — Telehealth: Payer: Self-pay | Admitting: Internal Medicine

## 2013-12-30 ENCOUNTER — Ambulatory Visit: Payer: Self-pay | Admitting: Internal Medicine

## 2013-12-30 NOTE — Telephone Encounter (Signed)
CT abdomen showed thickened uterine endometrium. I would recommend that this is evaluated further by a gynecologist. They will likely perform an endometrial biopsy. They also saw multiple uterine fibroids. For now, I would recommend follow up with gynecology as we discussed.

## 2013-12-31 NOTE — Telephone Encounter (Signed)
Pt's daughter notified.

## 2014-01-31 ENCOUNTER — Ambulatory Visit: Payer: Medicare Other | Admitting: Internal Medicine

## 2014-02-08 ENCOUNTER — Encounter: Payer: Self-pay | Admitting: Internal Medicine

## 2014-02-24 ENCOUNTER — Ambulatory Visit: Payer: Medicare Other | Admitting: Internal Medicine

## 2014-03-15 ENCOUNTER — Ambulatory Visit: Payer: Medicare Other | Admitting: Internal Medicine

## 2014-03-25 ENCOUNTER — Encounter: Payer: Self-pay | Admitting: Podiatry

## 2014-03-25 ENCOUNTER — Ambulatory Visit (INDEPENDENT_AMBULATORY_CARE_PROVIDER_SITE_OTHER): Payer: Medicare Other | Admitting: Podiatry

## 2014-03-25 DIAGNOSIS — M79676 Pain in unspecified toe(s): Secondary | ICD-10-CM

## 2014-03-25 DIAGNOSIS — M79609 Pain in unspecified limb: Secondary | ICD-10-CM

## 2014-03-25 DIAGNOSIS — B351 Tinea unguium: Secondary | ICD-10-CM

## 2014-03-25 NOTE — Progress Notes (Signed)
Subjective:  °  ° Patient ID: [name], female   DOB: 10/28/1931, 80 y.o.   MRN: [id number] ° °HPI patient presents with thick yellow painful nails 1-5 both feet that she cannot cut ° ° °Review of Systems ° °   °Objective:  ° Physical Exam °Neurovascular status intact with thick yellow brittle nailbeds 1-5 both feet °   °Assessment:  °   °Mycotic nail infection with pain 1-5 both feet °   °Plan:  °   °Debride painful nailbeds 1-5 both feet with no iatrogenic bleeding noted °   ° ° °

## 2014-04-08 ENCOUNTER — Ambulatory Visit: Payer: Medicare Other | Admitting: Internal Medicine

## 2014-04-22 DIAGNOSIS — N812 Incomplete uterovaginal prolapse: Secondary | ICD-10-CM | POA: Insufficient documentation

## 2014-04-22 DIAGNOSIS — N3941 Urge incontinence: Secondary | ICD-10-CM | POA: Insufficient documentation

## 2014-05-30 ENCOUNTER — Other Ambulatory Visit: Payer: Self-pay | Admitting: Internal Medicine

## 2014-06-24 ENCOUNTER — Ambulatory Visit (INDEPENDENT_AMBULATORY_CARE_PROVIDER_SITE_OTHER): Payer: Medicare Other | Admitting: Podiatry

## 2014-06-24 DIAGNOSIS — B351 Tinea unguium: Secondary | ICD-10-CM

## 2014-06-24 DIAGNOSIS — M79676 Pain in unspecified toe(s): Secondary | ICD-10-CM

## 2014-06-24 NOTE — Progress Notes (Signed)
Subjective:  °  ° Patient ID: [name], female   DOB: 06/29/1932, 80 y.o.   MRN: [id number] ° °HPI patient presents with thick yellow painful nails 1-5 both feet that she cannot cut ° ° °Review of Systems ° °   °Objective:  ° Physical Exam °Neurovascular status intact with thick yellow brittle nailbeds 1-5 both feet °   °Assessment:  °   °Mycotic nail infection with pain 1-5 both feet °   °Plan:  °   °Debride painful nailbeds 1-5 both feet with no iatrogenic bleeding noted °   ° ° °

## 2014-09-23 ENCOUNTER — Other Ambulatory Visit: Payer: Medicare Other

## 2014-10-07 ENCOUNTER — Ambulatory Visit (INDEPENDENT_AMBULATORY_CARE_PROVIDER_SITE_OTHER): Payer: Medicare Other | Admitting: Podiatry

## 2014-10-07 DIAGNOSIS — B351 Tinea unguium: Secondary | ICD-10-CM

## 2014-10-07 DIAGNOSIS — M79676 Pain in unspecified toe(s): Secondary | ICD-10-CM

## 2014-10-07 NOTE — Progress Notes (Signed)
Subjective:     Patient ID: [name], female   DOB: Dec 16, 1931, 81 y.o.   MRN: [id number]  HPI patient presents with thick yellow painful nails 1-5 both feet that she cannot cut   Review of Systems     Objective:   Physical Exam Neurovascular status intact with thick yellow brittle nailbeds 1-5 both feet    Assessment:     Mycotic nail infection with pain 1-5 both feet    Plan:     Debride painful nailbeds 1-5 both feet with no iatrogenic bleeding noted

## 2014-10-27 ENCOUNTER — Ambulatory Visit (INDEPENDENT_AMBULATORY_CARE_PROVIDER_SITE_OTHER): Payer: Medicare Other | Admitting: Internal Medicine

## 2014-10-27 ENCOUNTER — Encounter: Payer: Self-pay | Admitting: Internal Medicine

## 2014-10-27 VITALS — BP 104/68 | HR 81 | Temp 98.0°F | Ht 63.0 in | Wt 177.5 lb

## 2014-10-27 DIAGNOSIS — G4733 Obstructive sleep apnea (adult) (pediatric): Secondary | ICD-10-CM | POA: Insufficient documentation

## 2014-10-27 DIAGNOSIS — M25562 Pain in left knee: Secondary | ICD-10-CM

## 2014-10-27 DIAGNOSIS — R1084 Generalized abdominal pain: Secondary | ICD-10-CM

## 2014-10-27 DIAGNOSIS — F32A Depression, unspecified: Secondary | ICD-10-CM

## 2014-10-27 DIAGNOSIS — F329 Major depressive disorder, single episode, unspecified: Secondary | ICD-10-CM

## 2014-10-27 DIAGNOSIS — E210 Primary hyperparathyroidism: Secondary | ICD-10-CM

## 2014-10-27 LAB — CBC WITH DIFFERENTIAL/PLATELET
BASOS ABS: 0 10*3/uL (ref 0.0–0.1)
Basophils Relative: 0.6 % (ref 0.0–3.0)
EOS ABS: 0 10*3/uL (ref 0.0–0.7)
EOS PCT: 0 % (ref 0.0–5.0)
HCT: 43.2 % (ref 36.0–46.0)
HEMOGLOBIN: 14.2 g/dL (ref 12.0–15.0)
LYMPHS ABS: 1.7 10*3/uL (ref 0.7–4.0)
LYMPHS PCT: 25.2 % (ref 12.0–46.0)
MCHC: 32.9 g/dL (ref 30.0–36.0)
MCV: 88.2 fl (ref 78.0–100.0)
MONO ABS: 0.4 10*3/uL (ref 0.1–1.0)
Monocytes Relative: 6.1 % (ref 3.0–12.0)
Neutro Abs: 4.7 10*3/uL (ref 1.4–7.7)
Neutrophils Relative %: 68.1 % (ref 43.0–77.0)
PLATELETS: 183 10*3/uL (ref 150.0–400.0)
RBC: 4.9 Mil/uL (ref 3.87–5.11)
RDW: 14.3 % (ref 11.5–15.5)
WBC: 6.9 10*3/uL (ref 4.0–10.5)

## 2014-10-27 LAB — COMPREHENSIVE METABOLIC PANEL
ALK PHOS: 67 U/L (ref 39–117)
ALT: 13 U/L (ref 0–35)
AST: 17 U/L (ref 0–37)
Albumin: 4 g/dL (ref 3.5–5.2)
BILIRUBIN TOTAL: 0.5 mg/dL (ref 0.2–1.2)
BUN: 19 mg/dL (ref 6–23)
CO2: 31 meq/L (ref 19–32)
Calcium: 10.2 mg/dL (ref 8.4–10.5)
Chloride: 104 mEq/L (ref 96–112)
Creatinine, Ser: 1.16 mg/dL (ref 0.40–1.20)
GFR: 47.7 mL/min — AB (ref >60.00)
Glucose, Bld: 87 mg/dL (ref 70–99)
POTASSIUM: 4.2 meq/L (ref 3.5–5.1)
SODIUM: 138 meq/L (ref 135–145)
Total Protein: 6.7 g/dL (ref 6.0–8.3)

## 2014-10-27 MED ORDER — OMEPRAZOLE 20 MG PO CPDR: DELAYED_RELEASE_CAPSULE | ORAL | Status: DC

## 2014-10-27 NOTE — Progress Notes (Signed)
Pre visit review using our clinic review tool, if applicable. No additional management support is needed unless otherwise documented below in the visit note. 

## 2014-10-27 NOTE — Assessment & Plan Note (Signed)
Unable to tolerate current CPAP mask. Will set up evaluation with sleep specialist. Question if alternative mask or auto-titrate might be helpful.

## 2014-10-27 NOTE — Assessment & Plan Note (Signed)
Left knee pain intermittently with some instability. Full ROM on exam, however likely OA with possible meniscal tear. Recommended ortho evaluation,  However pt declines for now. Will continue to monitor.

## 2014-10-27 NOTE — Patient Instructions (Signed)
Labs today.  Follow up in 6 months and sooner as needed. 

## 2014-10-27 NOTE — Assessment & Plan Note (Signed)
Symptoms relatively stable on current medications. Continue to follow with Dr. Bridgett Larsson.

## 2014-10-27 NOTE — Progress Notes (Signed)
Subjective:    Patient ID: [name], female    DOB: February 05, 1932, 81 y.o.   MRN: [id number]  HPI  81YO female presents for follow up.  Last seen 12/2013 for abdominal pain. CT abdomen showed uterine fibroid and thickened endometrium. Follow up evaluation scheduled with GYN.  Depression -Continues to follow up with Dr. Bridgett Larsson. Depression symptoms well controlled. Compliant with medication.  OSA - had sleep study. Unable to tolerate pressure - 14cmH2O on current mask. Air blows around her face. Stopped wearing CPAP a couple of weeks ago because of this. Would like to set up follow up with sleep specialist.  Concerned about leg swelling bilaterally. Worse at night. Will not wear compression hose or keep legs elevated. Swelling improved in the mornings.  Left knee instability - Feels that knee locks at times. No falls. Occasional pain posterior left knee. Does not want to see orthopedics for this. Not taking any medication for pain.  Abdominal pain - No further abdominal pain. No vaginal bleeding. Prefers not to see GYN. Understands risks.  Past medical, surgical, family and social history per today's encounter.  Review of Systems  Constitutional: Negative for fever, chills, appetite change, fatigue and unexpected weight change.  Eyes: Negative for visual disturbance.  Respiratory: Negative for shortness of breath.   Cardiovascular: Negative for chest pain and leg swelling.  Gastrointestinal: Negative for vomiting, abdominal pain, diarrhea and constipation.  Musculoskeletal: Positive for myalgias and arthralgias.  Skin: Negative for color change and rash.  Hematological: Negative for adenopathy. Does not bruise/bleed easily.  Psychiatric/Behavioral: Positive for sleep disturbance and dysphoric mood. The patient is not nervous/anxious.        Objective:    BP 104/68 mmHg  Pulse 81  Temp(Src) 98 F (36.7 C) (Oral)  Ht 5\' 3"  (1.6 m)  Wt 177 lb 8 oz (80.513 kg)  BMI 31.45 kg/m2   SpO2 98% Physical Exam  Constitutional: She is oriented to person, place, and time. She appears well-developed and well-nourished. No distress.  HENT:  Head: Normocephalic and atraumatic.  Right Ear: External ear normal.  Left Ear: External ear normal.  Nose: Nose normal.  Mouth/Throat: Oropharynx is clear and moist. No oropharyngeal exudate.  Eyes: Conjunctivae and EOM are normal. Pupils are equal, round, and reactive to light. Right eye exhibits no discharge.  Neck: Normal range of motion. Neck supple. No thyromegaly present.  Cardiovascular: Normal rate, regular rhythm, normal heart sounds and intact distal pulses.  Exam reveals no gallop and no friction rub.   No murmur heard. Pulmonary/Chest: Effort normal. No respiratory distress. She has no wheezes. She has no rales.  Abdominal: Soft. Bowel sounds are normal. She exhibits no distension and no mass. There is no tenderness. There is no rebound and no guarding.  Musculoskeletal: Normal range of motion. She exhibits edema (trace edema bilateral ankles). She exhibits no tenderness.  Lymphadenopathy:    She has no cervical adenopathy.  Neurological: She is alert and oriented to person, place, and time. No cranial nerve deficit. Coordination normal.  Skin: Skin is warm and dry. No rash noted. She is not diaphoretic. No erythema. No pallor.  Psychiatric: Judgment and thought content normal. Her speech is delayed. She is withdrawn. Cognition and memory are normal. She exhibits a depressed mood.          Assessment & Plan:   Problem List Items Addressed This Visit      Unprioritized   Abdominal pain    No persistent abdominal pain.  Suspect that symptoms were previously related to uterine fibroids. Pt declines referral to GYN. Understands risk of thickened uterine endometrium and holding off on evaluation.      Relevant Orders   CBC w/Diff   Depression - Primary    Symptoms relatively stable on current medications. Continue to  follow with Dr. Bridgett Larsson.      Left knee pain    Left knee pain intermittently with some instability. Full ROM on exam, however likely OA with possible meniscal tear. Recommended ortho evaluation,  However pt declines for now. Will continue to monitor.      OSA (obstructive sleep apnea)    Unable to tolerate current CPAP mask. Will set up evaluation with sleep specialist. Question if alternative mask or auto-titrate might be helpful.      Relevant Orders   Ambulatory referral to Sleep Studies   Primary hyperparathyroidism    Will recheck PTH and Ca with labs today.      Relevant Orders   Comprehensive metabolic panel   PTH, Intact and Calcium       Return in about 6 months (around 04/29/2015) for Wellness Visit.

## 2014-10-27 NOTE — Assessment & Plan Note (Signed)
Will recheck PTH and Ca with labs today.

## 2014-10-27 NOTE — Assessment & Plan Note (Signed)
No persistent abdominal pain. Suspect that symptoms were previously related to uterine fibroids. Pt declines referral to GYN. Understands risk of thickened uterine endometrium and holding off on evaluation.

## 2014-10-28 LAB — PTH, INTACT AND CALCIUM
Calcium: 10.5 mg/dL (ref 8.4–10.5)
PTH: 52 pg/mL (ref 14–64)

## 2014-11-14 ENCOUNTER — Ambulatory Visit: Payer: Self-pay | Admitting: Internal Medicine

## 2014-12-02 NOTE — Discharge Summary (Signed)
PATIENT NAME:  [name], [name] [name] MR#:  [id number] DATE OF BIRTH:  Nov 08, 1931  DATE OF ADMISSION:  10/30/2012 DATE OF DISCHARGE:  11/04/2012  PRIMARY CARE PHYSICIAN:  Dr. Ronette Deter.  FINAL DIAGNOSES: 1.  Acute respiratory failure, which improved.  2.  Clinical sepsis, which improved, with pneumonia.  3.  Diarrhea.  4.  Abdominal pain.  5.  Gastroesophageal reflux disease.   MEDICATIONS ON DISCHARGE: Include vitamin D3, 1 tablet daily, bupropion 100 mg daily, Lexapro 20 mg daily, Abilify 5 mg daily, omeprazole 20 mg daily, Ensure 240 mL 3 times a day with meals, Augmentin 500 mg twice a day until completed.   DIET: Pureed diet, thin liquids, aspiration precautions, meds in puree, monitoring at meals. Moisten and flavor foods. Diet is a low-sodium diet.   REFERRAL: To home physical therapy, occupational therapy, nurse, and nurse aides.   ACTIVITY: As tolerated.   FOLLOWUP: In 1 to 2 weeks with Dr. Ronette Deter.   HOSPITAL COURSE: The patient was admitted 10/30/2012 and discharged 11/04/2012. The patient was admitted with right lower chest and flank pain. She was admitted with acute respiratory failure, secondary to right lower lobe pneumonia. Blood and sputum cultures were sent off. The patient was initially started on ceftriaxone and azithromycin. She also had clinical sepsis, a previous left humerus fracture.   LABORATORY AND RADIOLOGICAL DATA: During the hospital course included an EKG that showed sinus tachycardia. Lipase 49. White blood cell count 11.0, H and H 13.3 and 41.1, platelet count 212. Glucose 101, BUN 15, creatinine 1.04, sodium 137, potassium 3.8, chloride 102, CO2 of 27, calcium 10.0.   Liver function tests, normal range.   Chest x-ray showed no definite evidence of pneumonia, limited secondary to hypoinflation.   Blood cultures were negative.   CT scan of the abdomen and pelvis showed right lower lobe pneumonia. Nothing specific on the abdominal scan.    Urinalysis showed trace leukocyte esterase.   Repeat chest x-ray showed findings suggestive of a right base pneumonia and right pleural effusion.   Ultrasound of the abdomen showed sludge in the gallbladder, otherwise normal.   Stool for C. difficile was negative.   Creatinine upon discharge: 0.99. White blood cell count on discharge, 5.5.   [place] COURSE: Included speech pathology, [name] [name] [name], and [name] [name] [name], physical therapy.   HOSPITAL COURSE PER PROBLEM LIST:  1.  For the patient's acute respiratory failure, the patient was given oxygen supplementation. It was as high as 4 liters nasal cannula during the hospital course. At the time of discharge was on room air, oxygen saturation 94%.  2.  For the patient's clinical sepsis, the patient did have pneumonia that was found initially on CT scan. The patient did have a temperature of 102 on presentation, and a pulse of 106. Blood cultures were negative. Since the patient did not have teeth, I expected the patient to have aspiration, so I did switch the initial antibiotics that were Rocephin and Zithromax over to Zosyn, and I kept the Zithromax. The patient completed an entire course of Zithromax during the hospitalization. The patient's Zosyn was switched over to Augmentin for completion of a 10-day course. The patient was clinically improved.  3.  Diarrhea: The patient developed diarrhea at the end of the hospital stay. Was kept an extra day for this. Stool for C. difficile was negative.  4.  The patient had abdominal pain that was gripping in nature. Her CT scan was unremarkable. Ultrasound of the abdomen  was unremarkable. With treatment of the pneumonia this seemed to get better, and had no pain upon discharge.  5.  Gastroesophageal reflux disease: The patient is on omeprazole.  6.  No changes in psychiatric medications were made by me on this hospitalization.   Time spent on discharge: Thirty-five  minutes.     ____________________________ [name] [name] [name] rjw:dm D: 11/05/2012 15:53:00 ET T: 11/05/2012 16:13:49 ET JOB#: [id number]  cc: [name] [name] [place] A. Gilford Rile, [name] [name] [name] [name], <Dictator>     [name] [name] [name] ELECTRONICALLY SIGNED 11/11/2012 10:32

## 2014-12-02 NOTE — H&P (Signed)
PATIENT NAME:  [name], [name] MR#:  [id number] DATE OF BIRTH:  04-20-1932  DATE OF ADMISSION:  10/30/2012  PRIMARY CARE PHYSICIAN:  Ronette Deter  CHIEF COMPLAINT: Right lower chest and flank pain in the back.   HISTORY OF PRESENTING ILLNESS: A 81 year old Caucasian female patient with history of depression, recent fall with left humerus fracture presents to the hospital with complaints of right lower chest and flank pain which started earlier today. The patient did have on-and-off cough for the past few days. Here in the Emergency Room, the patient has been found to have fever of 102.5, tachycardic in the 102s with oxygen saturations of 85% on room air. Initial chest x-ray did not show any infiltrate. A CT of the abdomen was done for the right-sided flank pain which showed right lower lobe pneumonia with syn-pneumonic effusion. Does not show any acute intra-abdominal or pelvic pathology.   The patient had a fall 2 weeks prior, landing on her face, broke her teeth and also had a left humerus fracture for which she has been wearing a sling.  Pain is nonradiating with aggravation on moving and taking a deep breath. No relieving factors. She rates it on 8 on 10.   PAST MEDICAL HISTORY: 1.  Severe depression.  2.  Sleep apnea with CPAP at night.  3.  Recent left humerus fracture.   FAMILY HISTORY:  Reviewed and unknown.   ALLERGIES:  CODEINE AND DARVON, WHICH MAKES HER CRY, AS PER THE DAUGHTER.   HOME MEDICATIONS:  Include: 1.  (Dictation Anomaly) 5 mg oral once a day.  2.  Bupropion 100 mg oral once a day.  3.  Escitalopram 20 mg oral once a day.  4.  Omeprazole 20 mg oral once a day.  5.  Vitamin D3 one tablet oral once a day.   SOCIAL HISTORY: The patient lives with her daughter. Ambulates with a walker. Does not smoke. No alcohol. No illicit drugs.   CODE STATUS:  DNR/DNI as discussed with patient and daughter in the room.   REVIEW OF SYSTEMS:  CONSTITUTIONAL: Has fever, has been  feeling weak since her fracture. No weight loss, weight gain.  EYES: No blurred vision, pain or redness.  ENT: No tinnitus, ear pain, hearing loss.  RESPIRATORY: Has a dry cough. No shortness of breath or COPD. CARDIOVASCULAR: No orthopnea, but does have edema and the chest pain because of pneumonia.  GASTROINTESTINAL: No nausea, vomiting, diarrhea, abdominal pain.  GENITOURINARY: No dysuria, hematuria, frequency.  ENDOCRINE: No polyuria, nocturia, thyroid problems.  HEMATOLOGIC: No anemia, easy bruising or bleeding.  INTEGUMENTARY: No acne, rash, lesions.  MUSCULOSKELETAL: Recent left humerus fracture with pain, also has arthritis.  NEUROLOGIC: No numbness, weakness, seizures.  PSYCHIATRIC: Has severe depression. No suicidal ideation.   PHYSICAL EXAMINATION: VITAL SIGNS: Shows temperature of 102.1, pulse of 106, respiration 20, blood pressure 116/53, saturating 85% on room air and 92% on oxygen.  GENERAL: Obese Caucasian female patient lying in the bed in no acute respiratory distress.  PSYCHIATRIC: Alert, oriented to person, place and time. Has a flat affect.  HEENT: Atraumatic, normocephalic. Oral mucosa moist and pink. External ears and nose normal. No pallor. No icterus. Pupils bilaterally equal and reactive to light.  NECK: Supple. No thyromegaly. No palpable lymph nodes. Trachea midline. No carotid bruit, JVD.  CARDIOVASCULAR: S1, S2, tachycardic without any murmurs, has 2+ edema in bilateral lower extremities.  RESPIRATORY: Has bilateral basal crackles with decreased air entry at the right base. Has tenderness  in the right lower chest. Increased work of breathing.   GASTROINTESTINAL: Soft, nontender. Bowel sounds present. No hepatosplenomegaly palpable.  GENITOURINARY: No CVA tenderness or bladder distention.  SKIN: Warm and dry. No petechiae, rash, ulcers.  MUSCULOSKELETAL: Has tenderness and mild swelling around the left humeral fracture site without any bruising or deformity  found. No other joint swelling, redness, effusion. Normal muscle tone.  NEUROLOGICAL: Motor strength 5/5 in upper and lower extremities. Limited assessment in the left upper extremities. Sensation to findings intact all over.  LYMPHATIC: No cervical lymphadenopathy.  LABORATORY DATA:  Show glucose 101, BUN 15, creatinine 1.04, sodium 137, potassium 3.8 with GFR of 51, lipase of 49. AST, ALT, alkaline phosphatase normal. WBC 11, hemoglobin 13.3, platelets of 212. Urinalysis shows 1+ bacteria with only 2 WBCs.  EKG shows sinus tachycardia. No acute ST or T wave changes.   Chest x-ray shows bilateral basilar atelectasis.   CT scan of the abdomen and pelvis shows right lower lobe pneumonia with small effusion.   ASSESSMENT AND PLAN: 1.  Acute respiratory failure secondary to right lower lobe community-acquired pneumonia. We will send for blood and sputum cultures. Continue the oxygen to keep saturations over 92%. The patient will be on IV ceftriaxone and azithromycin.  Her pneumonia is likely secondary from atelectasis from the recent fracture and significant amount of pain. We will start her on incentive spirometry. Nebulizers as needed.  2.  Sepsis secondary to the pneumonia.  3. Left humerus fracture. The patient is presently wearing a sling which will be continued.  4. CKD stage III.  The patient's GFR is 51, last known GFR of greater than 60 in 2011. The patient either has CKD versus mild acute renal insufficiency. We will continue on IV fluids. 5.  Depression: Continue home medications.  6.  Deep vein thrombosis prophylaxis with heparin.  CODE STATUS:  DNR/DNI.   Time spent today on this case was 45 minutes.    ____________________________ Leia Alf Kaliope Quinonez, MD srs:ce D: 10/30/2012 14:59:58 ET T: 10/30/2012 15:11:22 ET JOB#: [id number]  cc: Alveta Heimlich R. Hermina Barnard, MD, <Dictator>  Ronette Deter    Neita Carp MD ELECTRONICALLY SIGNED 11/04/2012 19:46

## 2015-01-06 ENCOUNTER — Ambulatory Visit: Payer: Medicare Other

## 2015-01-10 ENCOUNTER — Ambulatory Visit (INDEPENDENT_AMBULATORY_CARE_PROVIDER_SITE_OTHER): Payer: Medicare Other | Admitting: Podiatry

## 2015-01-10 DIAGNOSIS — M79676 Pain in unspecified toe(s): Secondary | ICD-10-CM | POA: Diagnosis not present

## 2015-01-10 DIAGNOSIS — B351 Tinea unguium: Secondary | ICD-10-CM | POA: Diagnosis not present

## 2015-01-10 NOTE — Progress Notes (Signed)
Subjective: 81 y.o.-year-old female returns the office today for painful, elongated, thickened toenails which she is unable to trim herself. Denies any redness or drainage around the nails. Denies any acute changes since last appointment and no new complaints today. Denies any systemic complaints such as fevers, chills, nausea, vomiting.   Objective: AAO 3, NAD DP/PT pulses palpable, CRT less than 3 seconds Protective sensation intact with Simms Weinstein monofilament Nails hypertrophic, dystrophic, elongated, brittle, discolored 10. There is tenderness overlying the nails 1-5 bilaterally. There is no surrounding erythema or drainage along the nail sites. No open lesions or pre-ulcerative lesions are identified. No other areas of tenderness bilateral lower extremities. No overlying edema, erythema, increased warmth. No pain with calf compression, swelling, warmth, erythema.  Assessment: Patient presents with symptomatic onychomycosis  Plan: -Treatment options including alternatives, risks, complications were discussed -Nails sharply debrided 10 without complication/bleeding. -Discussed daily foot inspection. If there are any changes, to call the office immediately.  -Follow-up in 3 months or sooner if any problems are to arise. In the meantime, encouraged to call the office with any questions, concerns, changes symptoms.

## 2015-01-31 ENCOUNTER — Telehealth: Payer: Self-pay | Admitting: Internal Medicine

## 2015-01-31 NOTE — Telephone Encounter (Signed)
Pt daughter dropped off a disability parking placard to be filled out. Placed in Dr. Derry Skill box. Please advise when ready.

## 2015-02-02 IMAGING — CR DG SHOULDER 3+V*L*
1 series · 3 of 3 positions shown · non-contrast
Comparison: none

REASON FOR EXAM: Fall
COMMENTS:

PROCEDURE:     DXR - DXR SHOULDER LEFT COMPLETE  - September 19, 2012  [DATE]
RESULT:     Slightly angulated comminuted fracture of the proximal left
humeral metaphysis is present.

[Series 1: x shoulder ap left · 0.14mm/px · 3 of 3 slices shown]
[im 1/3]
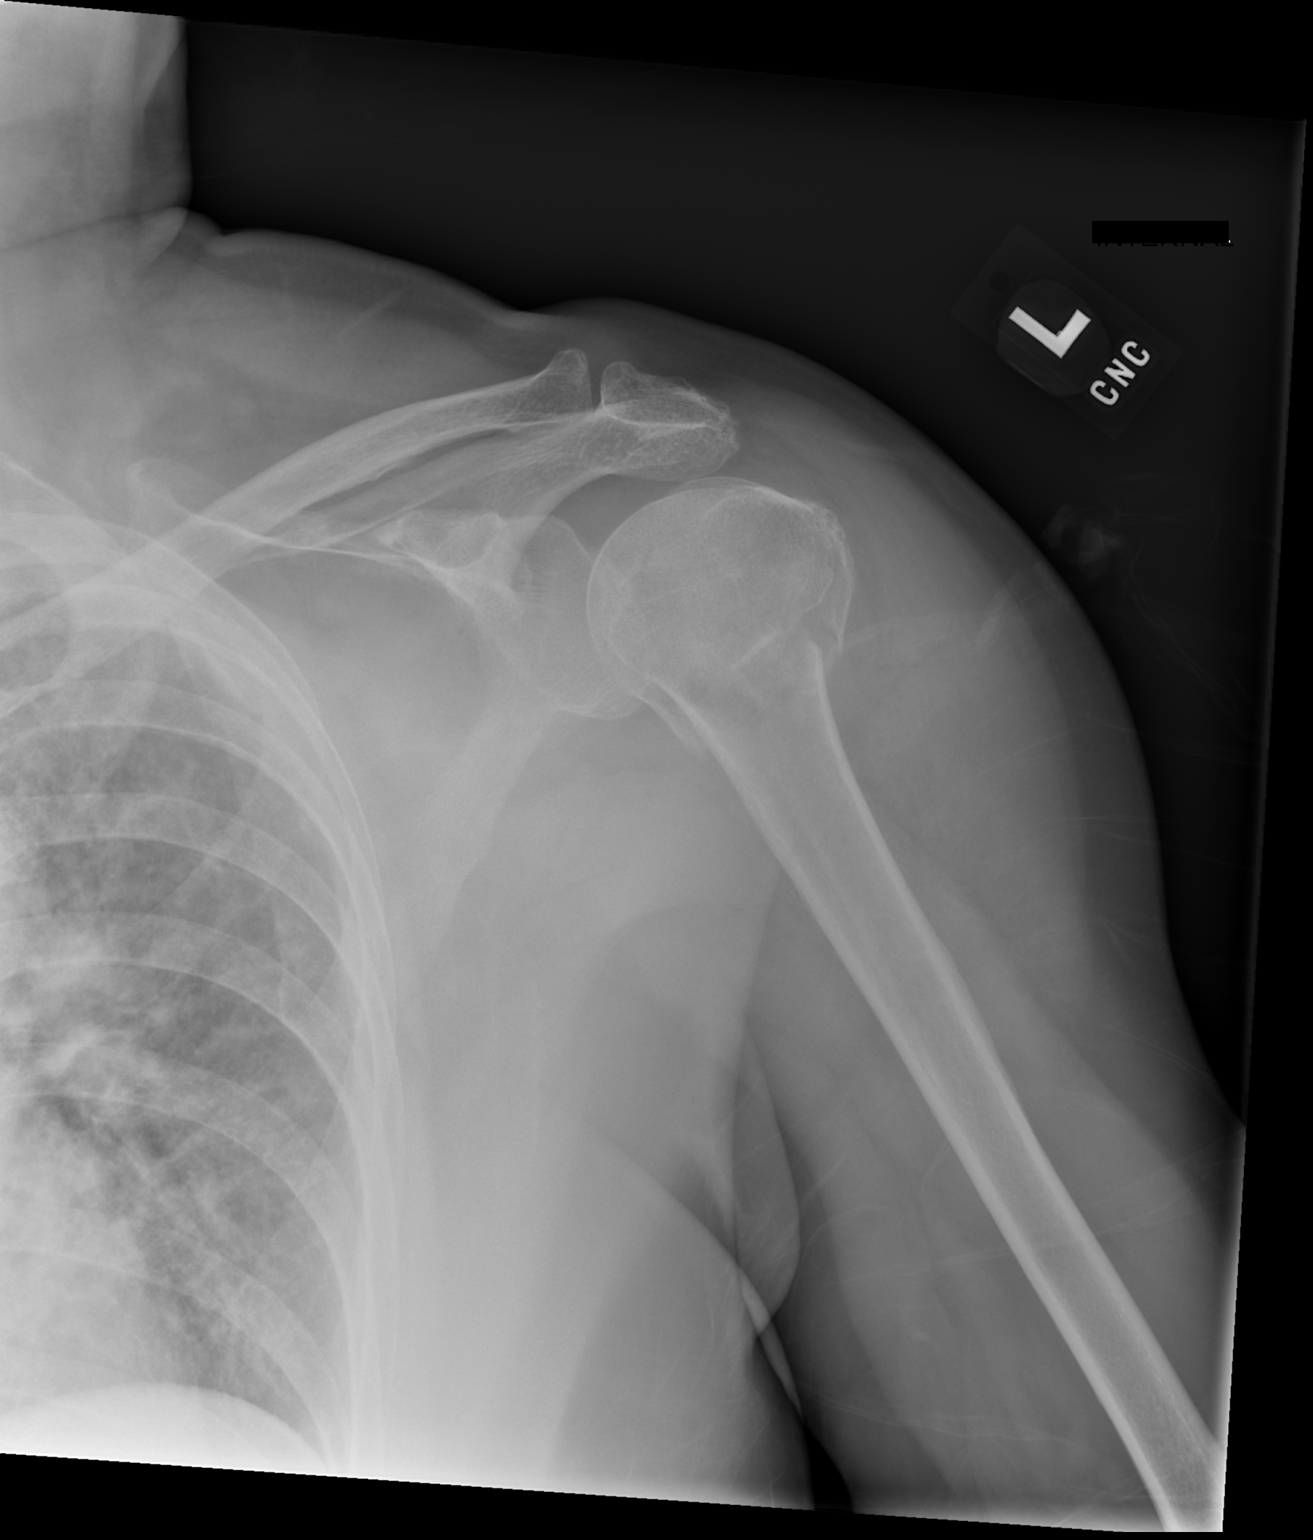
[im 2/3]
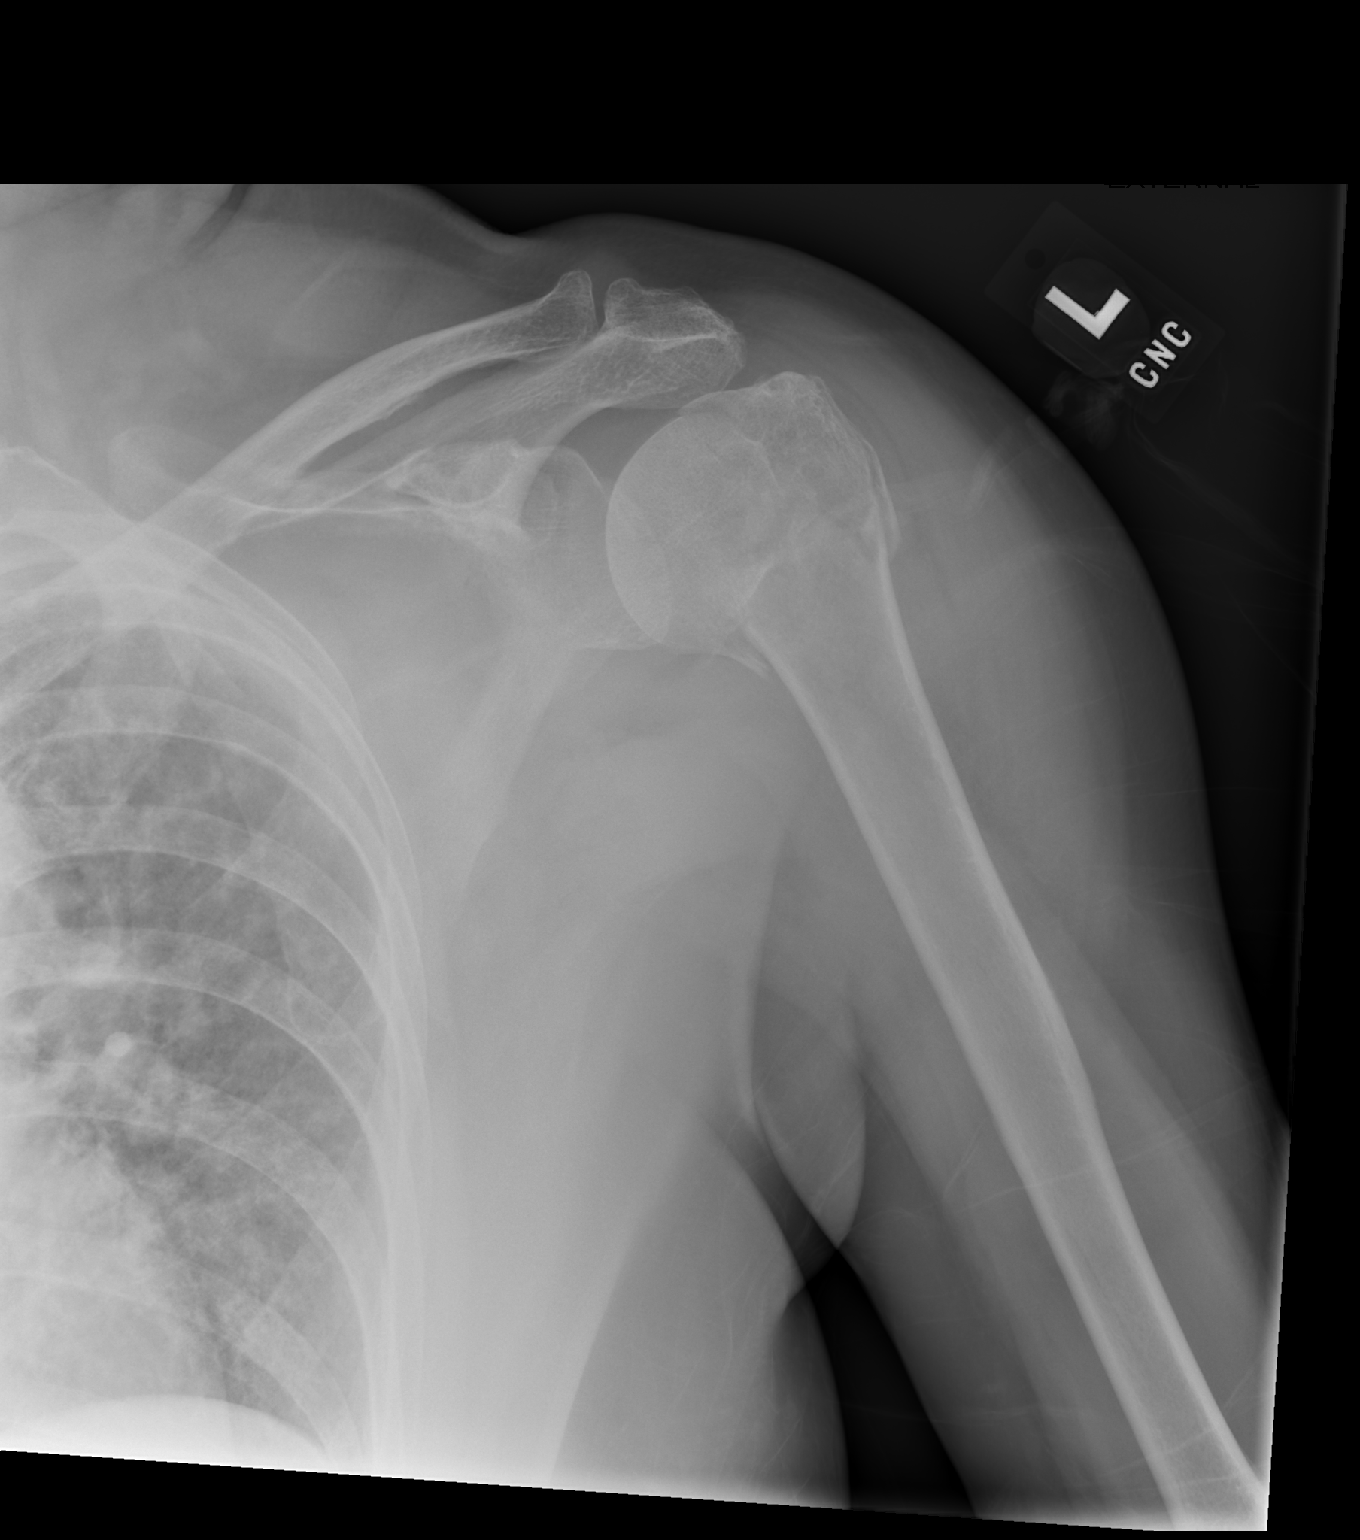
[im 3/3]
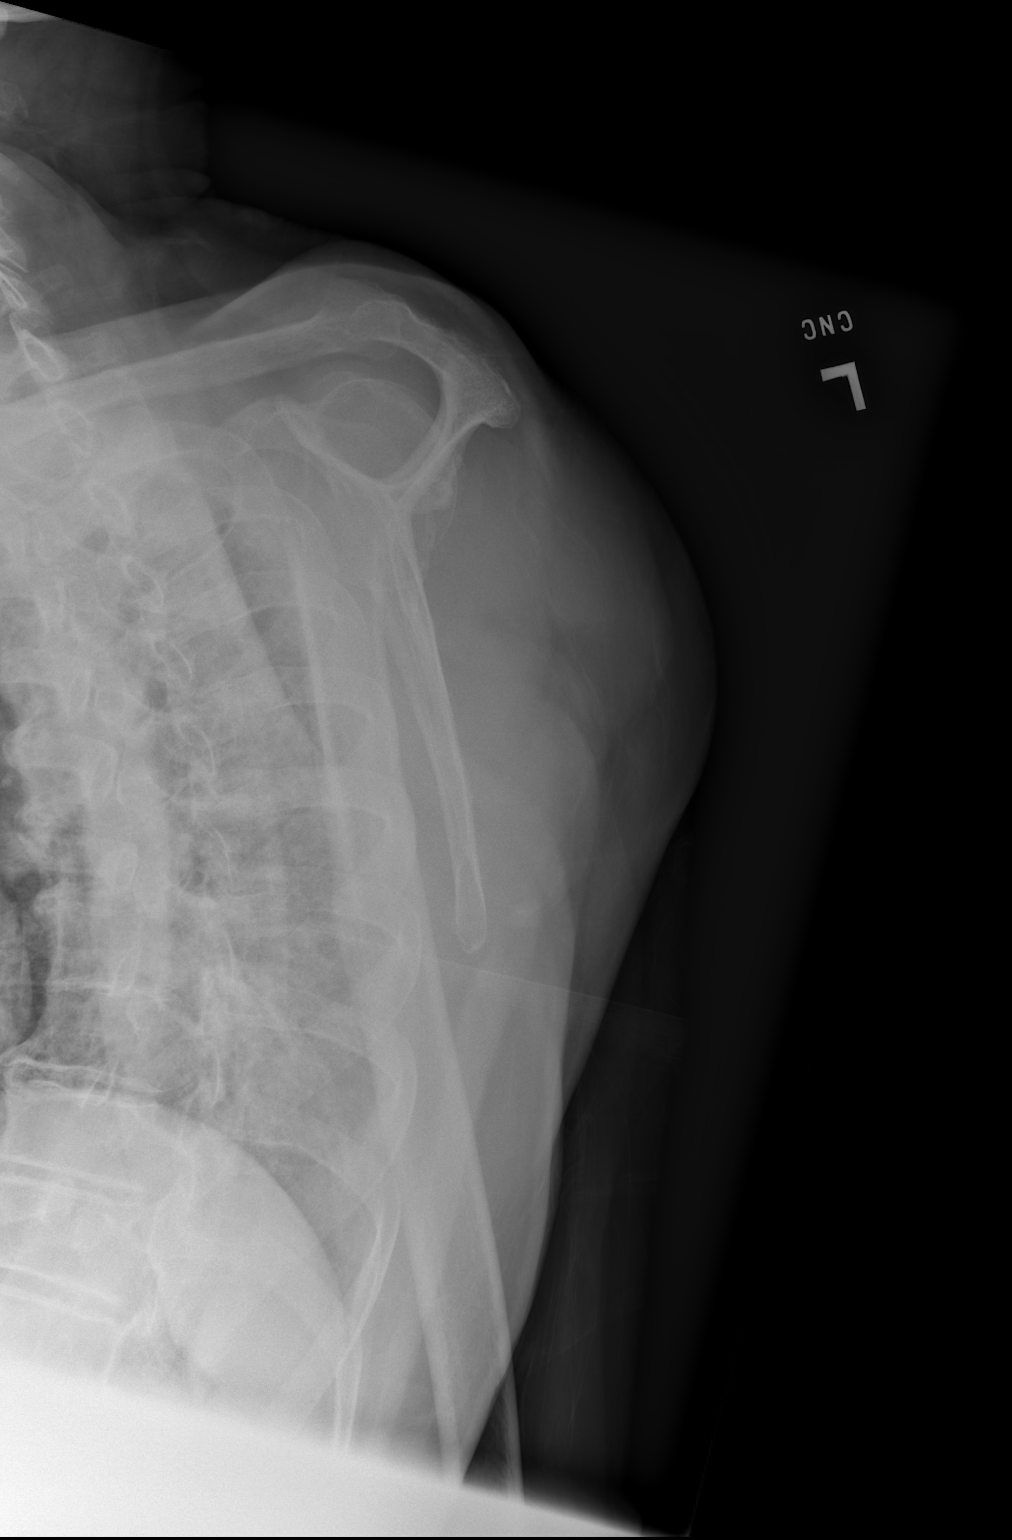

[3 of 3 positions shown; findings below may reference images not displayed]

IMPRESSION: Comminuted slightly annulated fracture of the left humeral
metaphysis.

## 2015-02-02 IMAGING — CT CT CERVICAL SPINE WITHOUT CONTRAST
1 series · 12 of 14 positions shown, 15 images · non-contrast
Comparison: none

REASON FOR EXAM: fall/ injury
COMMENTS:

PROCEDURE:     CT  - CT CERVICAL SPINE WO  - September 19, 2012  [DATE]
RESULT:     History: Fall.
Comparison Study: No recent.

[Series 4: axial · axial · 0.15mm/px · z∈[-237,-106]mm · 12 of 78 slices shown, 15 images]
[im 6/78  soft-tissue]
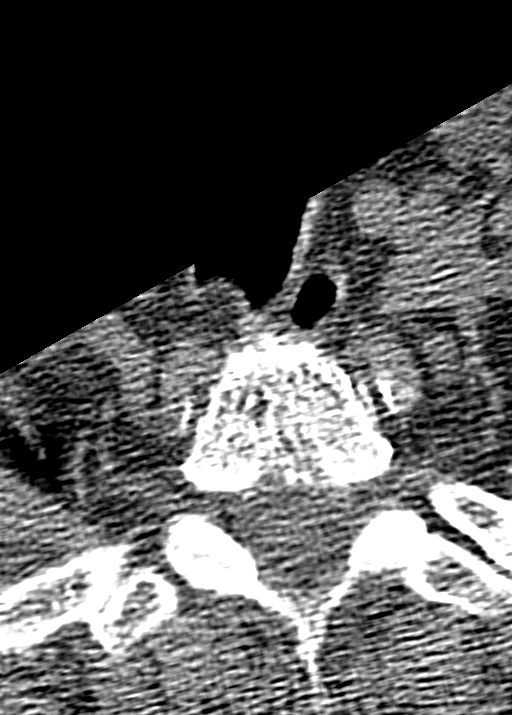
[im 6/78  bone]
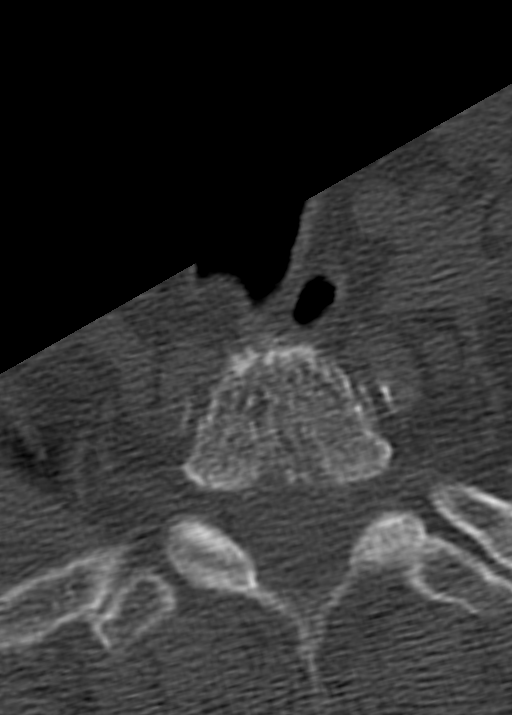
[im 12/78  bone]
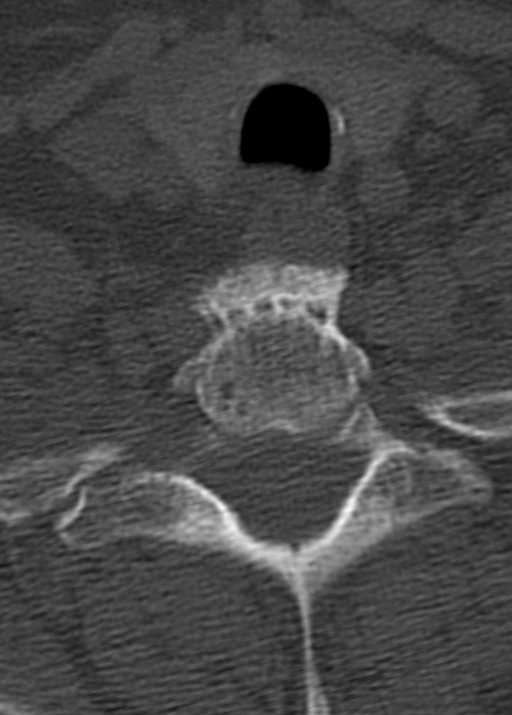
[im 18/78  bone]
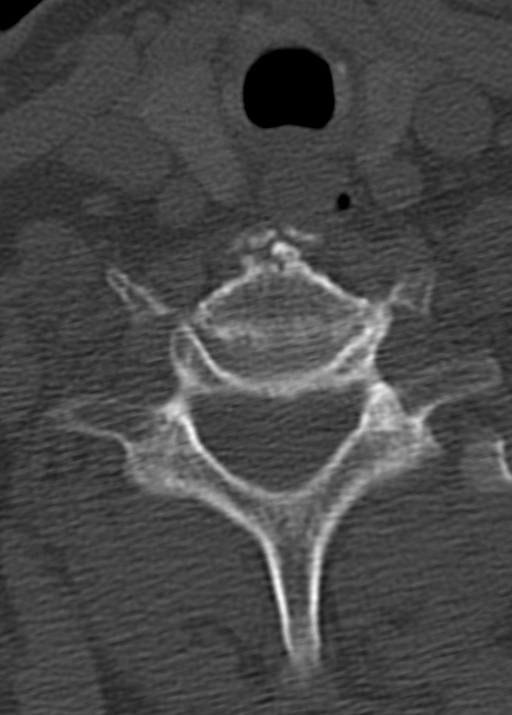
[im 24/78  bone]
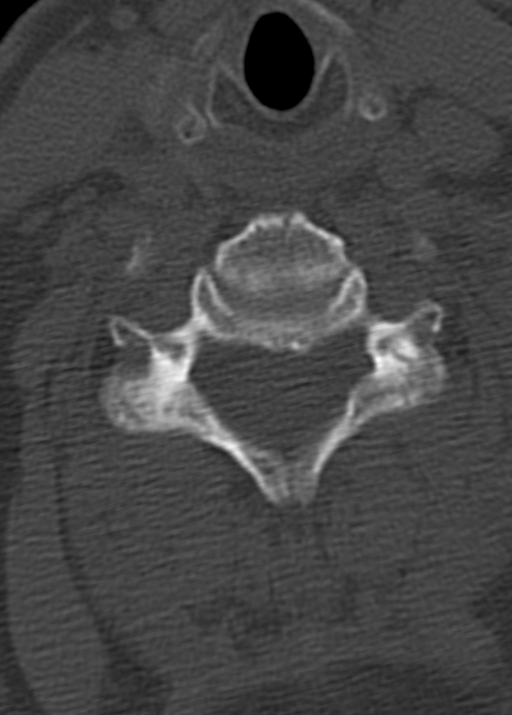
[im 30/78  soft-tissue]
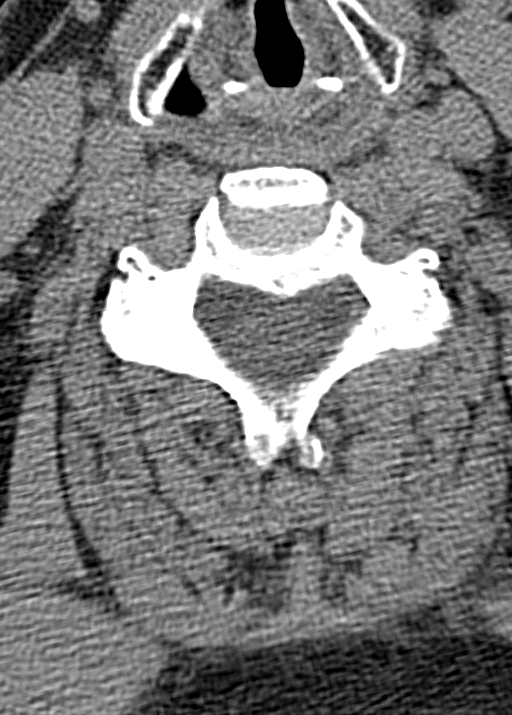
[im 30/78  bone]
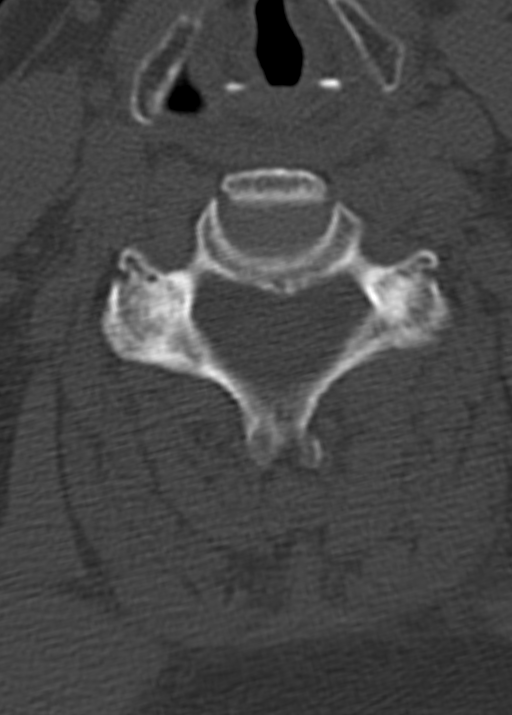
[im 36/78  bone]
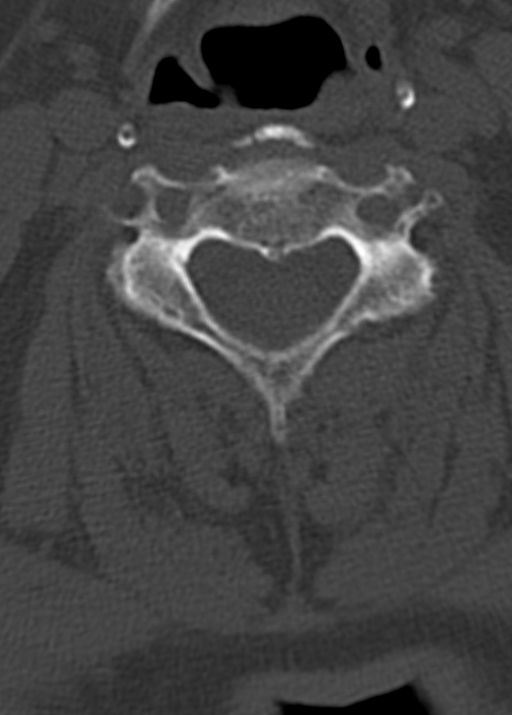
[im 42/78  bone]
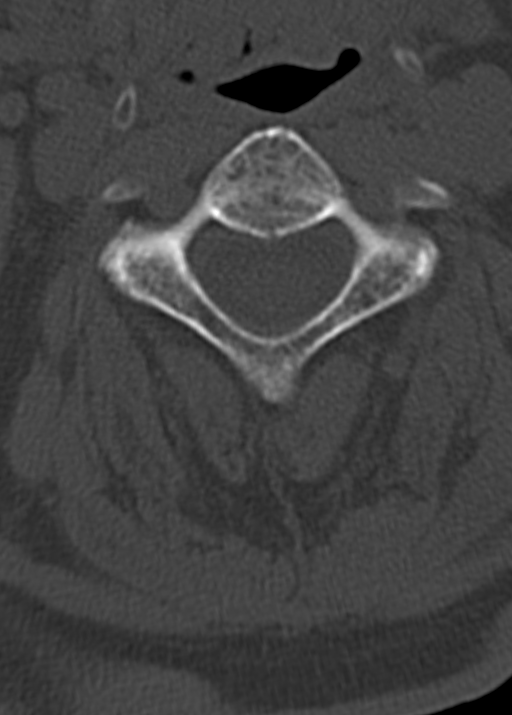
[im 48/78  bone]
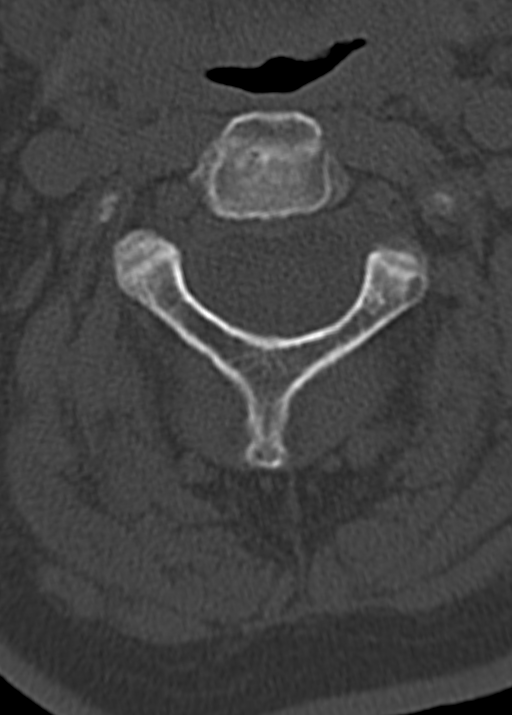
[im 54/78  soft-tissue]
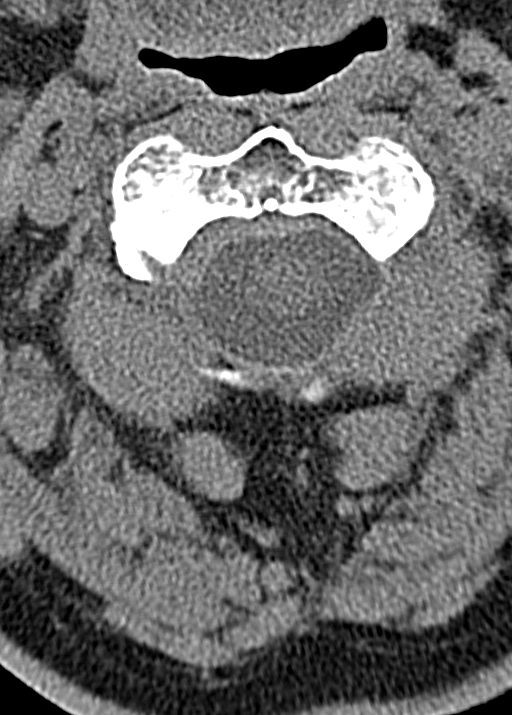
[im 54/78  bone]
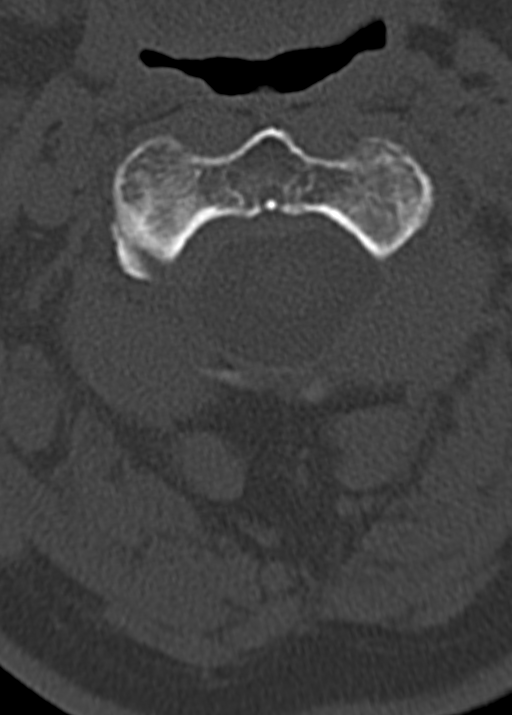
[im 60/78  bone]
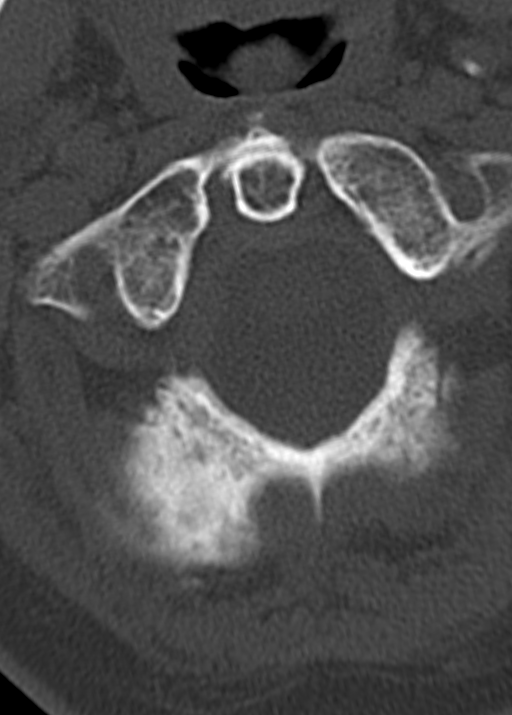
[im 66/78  bone]
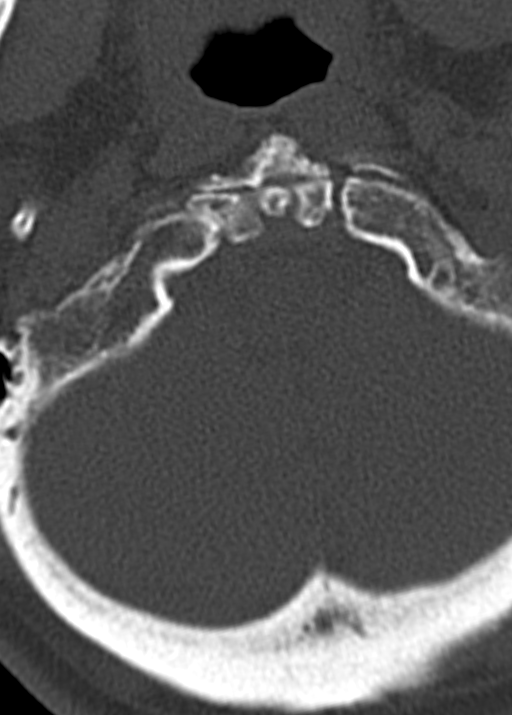
[im 72/78  bone]
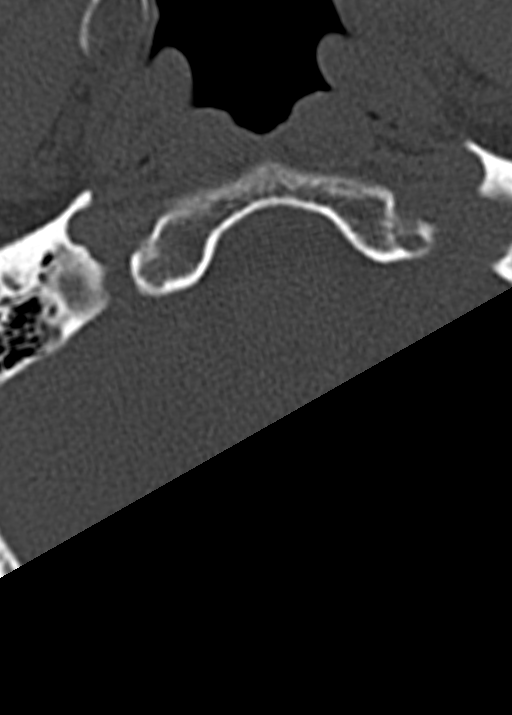

[12 of 14 positions shown; findings below may reference images not displayed]

FINDINGS: Standard CT obtained. Diffuse degenerative change. No evidence of
fracture. No dislocation.
IMPRESSION: No acute abnormality.

## 2015-02-02 IMAGING — CR DG ELBOW 2V*L*
1 series · 2 of 2 positions shown · non-contrast
Comparison: none

REASON FOR EXAM: Fall
COMMENTS:

PROCEDURE:     DXR - DXR ELBOW LEFT AP AND LATERAL  - September 19, 2012  [DATE]
RESULT:     No acute bony or joint abnormality.

[Series 4: x elbow ap left · 0.14mm/px · 2 of 2 slices shown]
[im 1/2]
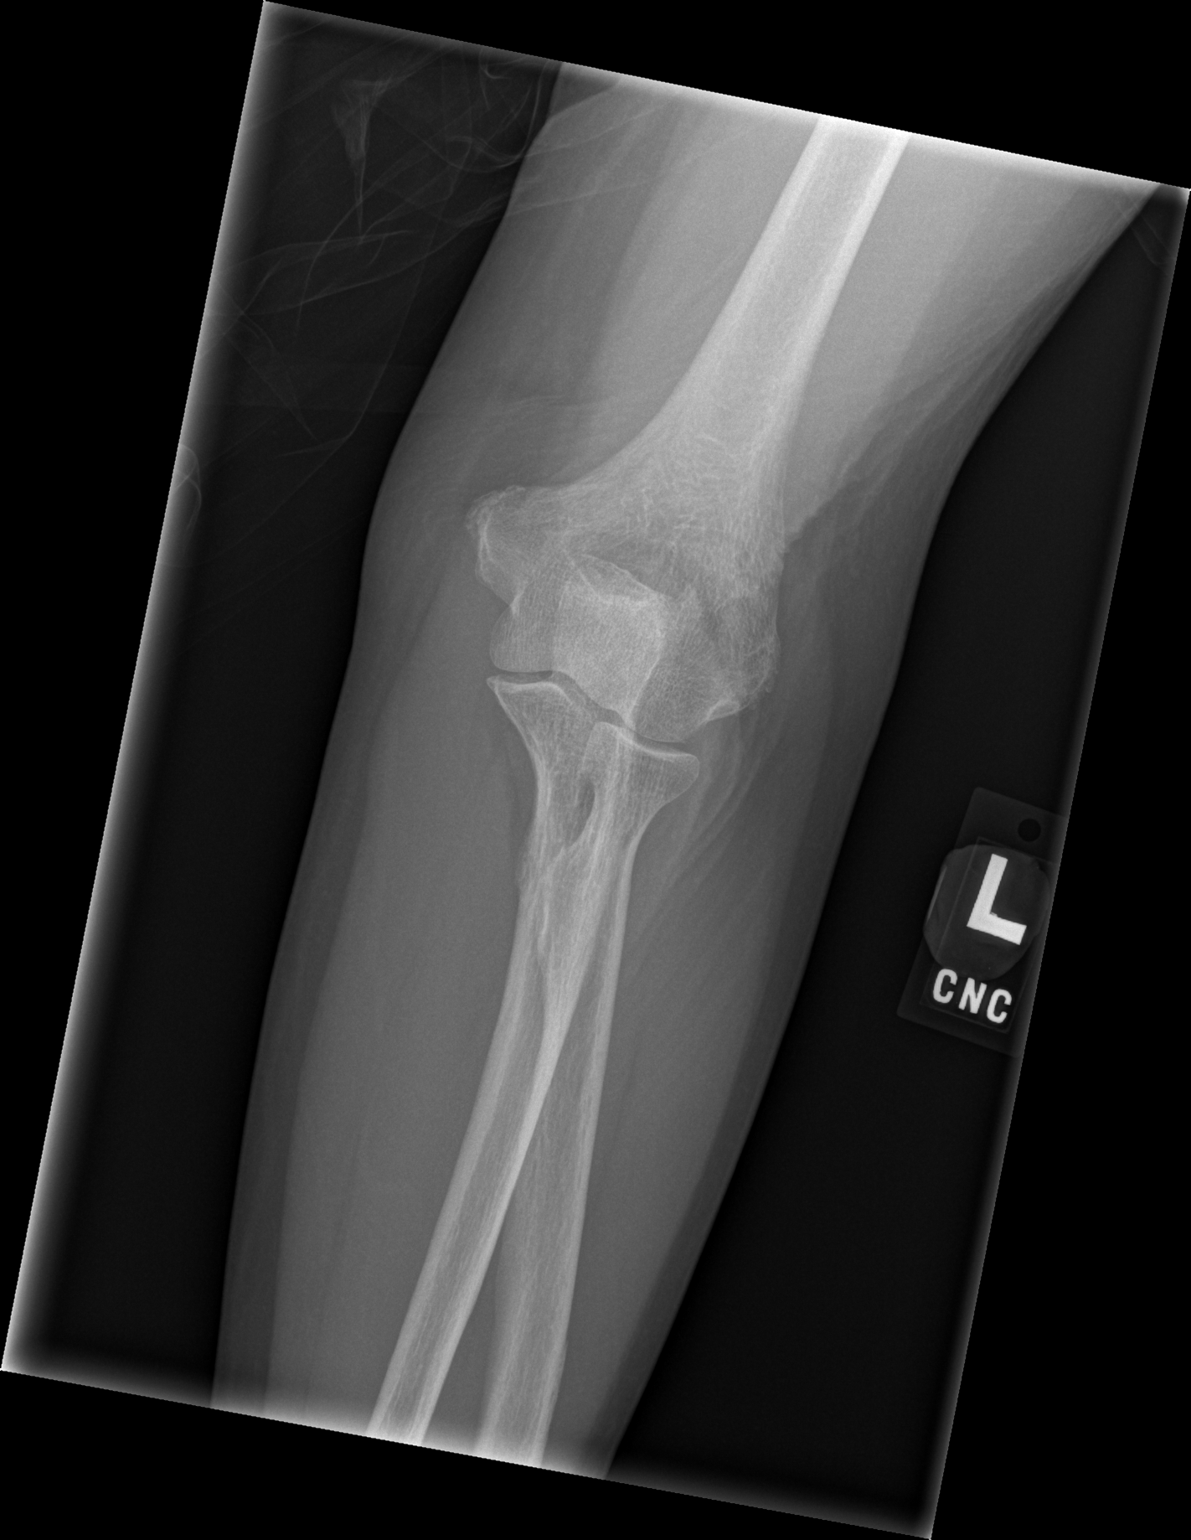
[im 2/2]
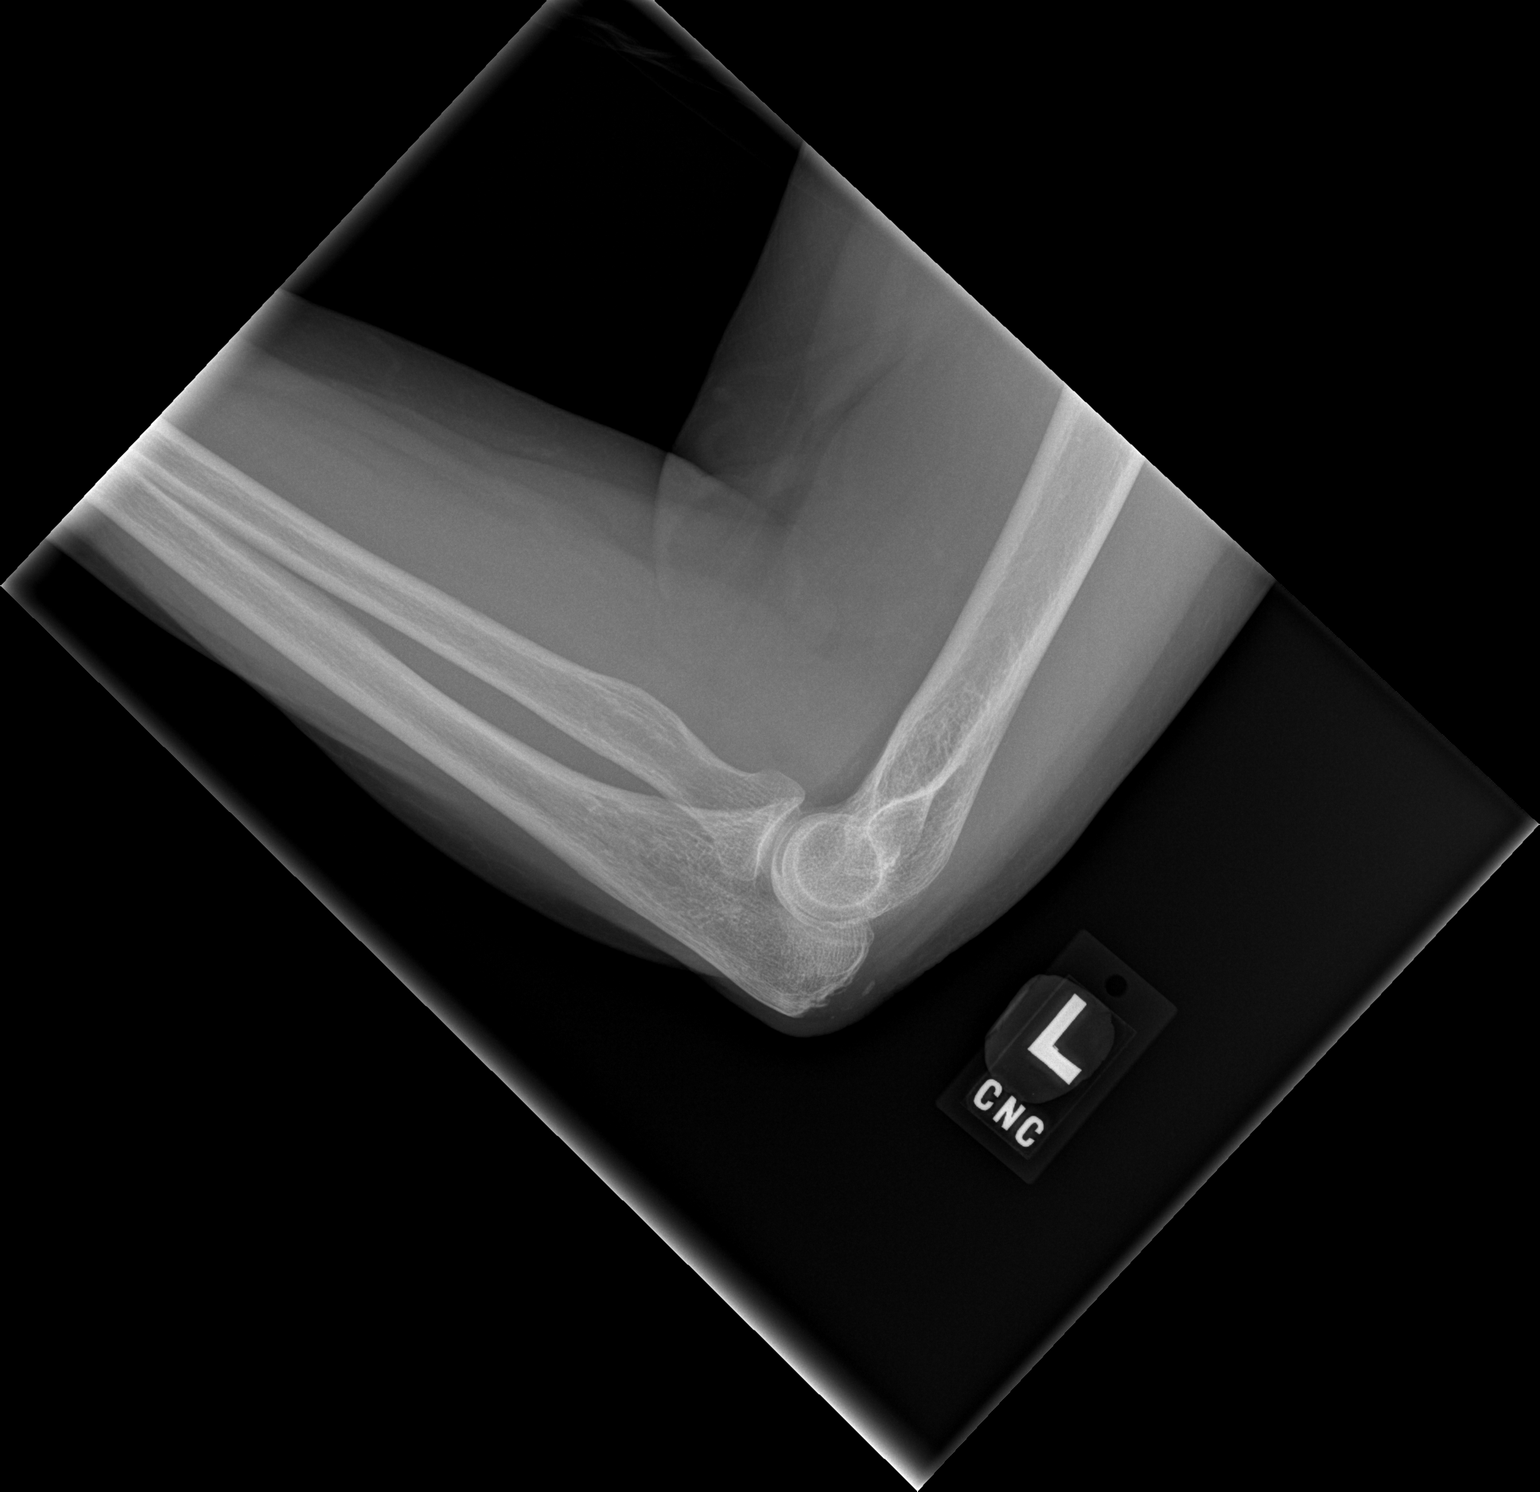

[2 of 2 positions shown; findings below may reference images not displayed]

IMPRESSION: No acute abnormality.

## 2015-02-02 NOTE — Telephone Encounter (Signed)
Left vm for pt to return my call, Form has been completed and ready for pick up

## 2015-02-03 NOTE — Telephone Encounter (Signed)
Informed pt that placard paperwork was ready to pick up.

## 2015-03-13 ENCOUNTER — Encounter: Payer: Self-pay | Admitting: Internal Medicine

## 2015-03-13 ENCOUNTER — Ambulatory Visit (INDEPENDENT_AMBULATORY_CARE_PROVIDER_SITE_OTHER): Payer: Medicare Other | Admitting: Internal Medicine

## 2015-03-13 VITALS — BP 92/58 | HR 79 | Temp 98.3°F | Ht 63.0 in | Wt 182.2 lb

## 2015-03-13 DIAGNOSIS — M25562 Pain in left knee: Secondary | ICD-10-CM

## 2015-03-13 MED ORDER — HYDROCODONE-ACETAMINOPHEN 5-325 MG PO TABS: 1.0000 | ORAL_TABLET | Freq: Four times a day (QID) | ORAL | Status: DC | PRN

## 2015-03-13 NOTE — Progress Notes (Signed)
Pre visit review using our clinic review tool, if applicable. No additional management support is needed unless otherwise documented below in the visit note. 

## 2015-03-13 NOTE — Progress Notes (Signed)
Subjective:    Patient ID: [name], female    DOB: 1931/10/04, 81 y.o.   MRN: [id number]  HPI  81YO female presents for acute visit.  Leg pain - Having more pain in left knee recently, over last few weeks. Questions injury which may have occurred when stepping out of a car. No recent falls. Pain mostly located behind left knee. Needs assistance to get to standing position.  Pain worsened with movement and weight bearing. Improved with rest. Took Aspirin for pain with minimal improvement.   Past medical, surgical, family and social history per today's encounter.  Review of Systems  Constitutional: Negative for fever, chills, appetite change, fatigue and unexpected weight change.  Eyes: Negative for visual disturbance.  Respiratory: Negative for shortness of breath.   Cardiovascular: Negative for chest pain and leg swelling.  Gastrointestinal: Negative for abdominal pain.  Musculoskeletal: Positive for myalgias, arthralgias and gait problem. Negative for joint swelling.  Skin: Negative for color change and rash.  Hematological: Negative for adenopathy. Does not bruise/bleed easily.  Psychiatric/Behavioral: Negative for dysphoric mood. The patient is not nervous/anxious.        Objective:    BP 92/58 mmHg  Pulse 79  Temp(Src) 98.3 F (36.8 C) (Oral)  Ht 5\' 3"  (1.6 m)  Wt 182 lb 4 oz (82.668 kg)  BMI 32.29 kg/m2  SpO2 93% Physical Exam  Constitutional: She is oriented to person, place, and time. She appears well-developed and well-nourished. No distress.  HENT:  Head: Normocephalic and atraumatic.  Right Ear: External ear normal.  Left Ear: External ear normal.  Nose: Nose normal.  Mouth/Throat: Oropharynx is clear and moist.  Eyes: Conjunctivae are normal. Pupils are equal, round, and reactive to light. Right eye exhibits no discharge. Left eye exhibits no discharge. No scleral icterus.  Neck: Normal range of motion. Neck supple. No tracheal deviation present. No  thyromegaly present.  Cardiovascular: Normal rate, regular rhythm, normal heart sounds and intact distal pulses.  Exam reveals no gallop and no friction rub.   No murmur heard. Pulmonary/Chest: Effort normal and breath sounds normal. No respiratory distress. She has no wheezes. She has no rales. She exhibits no tenderness.  Musculoskeletal: She exhibits no edema.       Left knee: She exhibits decreased range of motion. She exhibits no swelling, no effusion and no deformity. Tenderness (posterior knee) found.  Lymphadenopathy:    She has no cervical adenopathy.  Neurological: She is alert and oriented to person, place, and time. No cranial nerve deficit. She exhibits normal muscle tone. Coordination normal.  Skin: Skin is warm and dry. No rash noted. She is not diaphoretic. No erythema. No pallor.  Psychiatric: She has a normal mood and affect. Her behavior is normal. Judgment and thought content normal.          Assessment & Plan:   Problem List Items Addressed This Visit      Unprioritized   Left knee pain - Primary    Significant posterior knee pain last 2 weeks. Question meniscal tear. Will set up MRI left knee and ortho evaluation. Encouraged her to ice knee several times per day. Start prn Hydrocodone-acetaminophen for severe pain only. Keep leg elevated. Use cane for assistance walking. Follow up in 4 weeks after ortho evaluation complete.      Relevant Medications   HYDROcodone-acetaminophen (NORCO/VICODIN) 5-325 MG per tablet   Other Relevant Orders   Ambulatory referral to Orthopedic Surgery   MR Knee Left  Wo  Contrast       Return in about 6 weeks (around 04/24/2015) for Recheck.

## 2015-03-13 NOTE — Patient Instructions (Addendum)
We will set up MRI of the left knee.  We will also set up evaluation with Dr. Marry Guan.  Start Hydrocodone-Acetaminophen as needed for pain.  Follow up here in 4-8weeks.

## 2015-03-13 NOTE — Assessment & Plan Note (Signed)
Significant posterior knee pain last 2 weeks. Question meniscal tear. Will set up MRI left knee and ortho evaluation. Encouraged her to ice knee several times per day. Start prn Hydrocodone-acetaminophen for severe pain only. Keep leg elevated. Use cane for assistance walking. Follow up in 4 weeks after ortho evaluation complete.

## 2015-03-14 ENCOUNTER — Ambulatory Visit
Admission: RE | Admit: 2015-03-14 | Discharge: 2015-03-14 | Disposition: A | Discharge status: Home / Self Care | Payer: Medicare Other | Source: Ambulatory Visit | Attending: Internal Medicine | Admitting: Internal Medicine

## 2015-03-14 DIAGNOSIS — M1712 Unilateral primary osteoarthritis, left knee: Secondary | ICD-10-CM | POA: Diagnosis not present

## 2015-03-14 DIAGNOSIS — M2342 Loose body in knee, left knee: Secondary | ICD-10-CM | POA: Diagnosis not present

## 2015-03-14 DIAGNOSIS — M25562 Pain in left knee: Secondary | ICD-10-CM | POA: Diagnosis present

## 2015-03-14 DIAGNOSIS — S83232A Complex tear of medial meniscus, current injury, left knee, initial encounter: Secondary | ICD-10-CM | POA: Insufficient documentation

## 2015-04-15 DIAGNOSIS — F329 Major depressive disorder, single episode, unspecified: Secondary | ICD-10-CM | POA: Insufficient documentation

## 2015-04-18 ENCOUNTER — Ambulatory Visit (INDEPENDENT_AMBULATORY_CARE_PROVIDER_SITE_OTHER): Payer: Medicare Other | Admitting: Podiatry

## 2015-04-18 DIAGNOSIS — B351 Tinea unguium: Secondary | ICD-10-CM | POA: Diagnosis not present

## 2015-04-18 DIAGNOSIS — M79676 Pain in unspecified toe(s): Secondary | ICD-10-CM | POA: Diagnosis not present

## 2015-04-18 NOTE — Progress Notes (Signed)
Subjective: 81 y.o.-year-old female returns the office today for painful, elongated, thickened toenails which she is unable to trim herself. Denies any redness or drainage around the nails. Denies any acute changes since last appointment and no new complaints today. Denies any systemic complaints such as fevers, chills, nausea, vomiting.   Objective: AAO 3, NAD DP/PT pulses palpable, CRT less than 3 seconds Nails hypertrophic, dystrophic, elongated, brittle, discolored 10. There is tenderness to nails 1-5 bilaterally. There is no surrounding erythema or drainage along the nail sites. No open lesions or pre-ulcerative lesions are identified. No other areas of tenderness bilateral lower extremities. No overlying edema, erythema, increased warmth. No pain with calf compression, swelling, warmth, erythema.  Assessment: Patient presents with symptomatic onychomycosis  Plan: -Treatment options including alternatives, risks, complications were discussed -Nails sharply debrided 10 without complication/bleeding. -Discussed daily foot inspection. If there are any changes, to call the office immediately.  -Follow-up in 3 months or sooner if any problems are to arise. In the meantime, encouraged to call the office with any questions, concerns, changes symptoms.    Celesta Gentile, DPM

## 2015-04-21 ENCOUNTER — Encounter
Admission: RE | Admit: 2015-04-21 | Discharge: 2015-04-21 | Disposition: A | Discharge status: Home / Self Care | Payer: Medicare Other | Source: Ambulatory Visit | Attending: Orthopedic Surgery | Admitting: Orthopedic Surgery

## 2015-04-21 DIAGNOSIS — Z01810 Encounter for preprocedural cardiovascular examination: Secondary | ICD-10-CM | POA: Insufficient documentation

## 2015-04-21 DIAGNOSIS — Z01812 Encounter for preprocedural laboratory examination: Secondary | ICD-10-CM | POA: Insufficient documentation

## 2015-04-21 HISTORY — DX: Sepsis, unspecified organism: A41.9

## 2015-04-21 HISTORY — DX: Pneumonia, unspecified organism: J18.9

## 2015-04-21 HISTORY — DX: Sleep apnea, unspecified: G47.30

## 2015-04-21 HISTORY — DX: Anxiety disorder, unspecified: F41.9

## 2015-04-21 HISTORY — DX: Gastro-esophageal reflux disease without esophagitis: K21.9

## 2015-04-21 HISTORY — DX: Shortness of breath: R06.02

## 2015-04-21 LAB — CBC
HEMATOCRIT: 44.7 % (ref 35.0–47.0)
HEMOGLOBIN: 14.5 g/dL (ref 12.0–16.0)
MCH: 28.6 pg (ref 26.0–34.0)
MCHC: 32.4 g/dL (ref 32.0–36.0)
MCV: 88.4 fL (ref 80.0–100.0)
Platelets: 211 10*3/uL (ref 150–440)
RBC: 5.06 MIL/uL (ref 3.80–5.20)
RDW: 14.4 % (ref 11.5–14.5)
WBC: 7.3 10*3/uL (ref 3.6–11.0)

## 2015-04-21 LAB — BASIC METABOLIC PANEL
ANION GAP: 8 (ref 5–15)
BUN: 17 mg/dL (ref 6–20)
CHLORIDE: 105 mmol/L (ref 101–111)
CO2: 26 mmol/L (ref 22–32)
Calcium: 10.9 mg/dL — ABNORMAL HIGH (ref 8.9–10.3)
Creatinine, Ser: 1.24 mg/dL — ABNORMAL HIGH (ref 0.44–1.00)
GFR calc non Af Amer: 40 mL/min — ABNORMAL LOW (ref >60)
GFR, EST AFRICAN AMERICAN: 46 mL/min — AB (ref >60)
GLUCOSE: 94 mg/dL (ref 65–99)
Potassium: 4.3 mmol/L (ref 3.5–5.1)
Sodium: 139 mmol/L (ref 135–145)

## 2015-04-21 NOTE — Patient Instructions (Signed)
  Your procedure is scheduled on: 05/03/15 Wed  Report to Day Surgery. To find out your arrival time please call [phone] between 1PM - 3PM on 05/02/15 Tues.  Remember: Instructions that are not followed completely may result in serious medical risk, up to and including death, or upon the discretion of your surgeon and anesthesiologist your surgery may need to be rescheduled.    _x___ 1. Do not eat food or drink liquids after midnight. No gum chewing or hard candies.     ____ 2. No Alcohol for 24 hours before or after surgery.   ____ 3. Bring all medications with you on the day of surgery if instructed.    __x__ 4. Notify your doctor if there is any change in your medical condition     (cold, fever, infections).     Do not wear jewelry, make-up, hairpins, clips or nail polish.  Do not wear lotions, powders, or perfumes. You may wear deodorant.  Do not shave 48 hours prior to surgery. Men may shave face and neck.  Do not bring valuables to the hospital.    [facility] is not responsible for any belongings or valuables.               Contacts, dentures or bridgework may not be worn into surgery.  Leave your suitcase in the car. After surgery it may be brought to your room.  For patients admitted to the hospital, discharge time is determined by your                treatment team.   Patients discharged the day of surgery will not be allowed to drive home.   Please read over the following fact sheets that you were given:      _x___ Take these medicines the morning of surgery with A SIP OF WATER:    1. buPROPion (WELLBUTRIN) 100 MG tablet  2. escitalopram (LEXAPRO) 20 MG tablet  3. HYDROcodone-acetaminophen (NORCO/VICODIN) 5-325 MG per tablet  4.loperamide (IMODIUM) 2 MG capsule  5.omeprazole (PRILOSEC) 20 MG capsule  6.  ____ Fleet Enema (as directed)   _x___ Use CHG Soap as directed  ____ Use inhalers on the day of surgery  ____ Stop metformin 2 days prior to  surgery    ____ Take 1/2 of usual insulin dose the night before surgery and none on the morning of surgery.   __x__ Stop Coumadin/Plavix/aspirin on stop aspirin 1 week before surgery  ____ Stop Anti-inflammatories on    ____ Stop supplements until after surgery.    _x___ Bring C-Pap to the hospital.

## 2015-04-25 ENCOUNTER — Inpatient Hospital Stay: Admission: RE | Admit: 2015-04-25 | Payer: Medicare Other | Source: Ambulatory Visit

## 2015-04-27 ENCOUNTER — Other Ambulatory Visit: Payer: Self-pay | Admitting: Internal Medicine

## 2015-05-01 ENCOUNTER — Encounter: Payer: Medicare Other | Admitting: Internal Medicine

## 2015-05-03 ENCOUNTER — Ambulatory Visit: Payer: Medicare Other | Admitting: Anesthesiology

## 2015-05-03 ENCOUNTER — Ambulatory Visit
Admission: RE | Admit: 2015-05-03 | Discharge: 2015-05-03 | Disposition: A | Discharge status: Home / Self Care | Payer: Medicare Other | Source: Ambulatory Visit | Attending: Orthopedic Surgery | Admitting: Orthopedic Surgery

## 2015-05-03 ENCOUNTER — Encounter: Payer: Self-pay | Admitting: *Deleted

## 2015-05-03 ENCOUNTER — Encounter
Admission: RE | Disposition: A | Discharge status: Home / Self Care | Payer: Self-pay | Source: Ambulatory Visit | Attending: Orthopedic Surgery

## 2015-05-03 DIAGNOSIS — M810 Age-related osteoporosis without current pathological fracture: Secondary | ICD-10-CM | POA: Diagnosis not present

## 2015-05-03 DIAGNOSIS — Z825 Family history of asthma and other chronic lower respiratory diseases: Secondary | ICD-10-CM | POA: Diagnosis not present

## 2015-05-03 DIAGNOSIS — M9420 Chondromalacia, unspecified site: Secondary | ICD-10-CM | POA: Diagnosis not present

## 2015-05-03 DIAGNOSIS — I872 Venous insufficiency (chronic) (peripheral): Secondary | ICD-10-CM | POA: Diagnosis not present

## 2015-05-03 DIAGNOSIS — Z8041 Family history of malignant neoplasm of ovary: Secondary | ICD-10-CM | POA: Diagnosis not present

## 2015-05-03 DIAGNOSIS — E785 Hyperlipidemia, unspecified: Secondary | ICD-10-CM | POA: Diagnosis not present

## 2015-05-03 DIAGNOSIS — Z841 Family history of disorders of kidney and ureter: Secondary | ICD-10-CM | POA: Diagnosis not present

## 2015-05-03 DIAGNOSIS — Z7982 Long term (current) use of aspirin: Secondary | ICD-10-CM | POA: Diagnosis not present

## 2015-05-03 DIAGNOSIS — I059 Rheumatic mitral valve disease, unspecified: Secondary | ICD-10-CM | POA: Insufficient documentation

## 2015-05-03 DIAGNOSIS — I519 Heart disease, unspecified: Secondary | ICD-10-CM | POA: Diagnosis not present

## 2015-05-03 DIAGNOSIS — Z79899 Other long term (current) drug therapy: Secondary | ICD-10-CM | POA: Insufficient documentation

## 2015-05-03 DIAGNOSIS — K219 Gastro-esophageal reflux disease without esophagitis: Secondary | ICD-10-CM | POA: Insufficient documentation

## 2015-05-03 DIAGNOSIS — Z885 Allergy status to narcotic agent status: Secondary | ICD-10-CM | POA: Diagnosis not present

## 2015-05-03 DIAGNOSIS — G4733 Obstructive sleep apnea (adult) (pediatric): Secondary | ICD-10-CM | POA: Diagnosis not present

## 2015-05-03 DIAGNOSIS — Z8249 Family history of ischemic heart disease and other diseases of the circulatory system: Secondary | ICD-10-CM | POA: Diagnosis not present

## 2015-05-03 DIAGNOSIS — F329 Major depressive disorder, single episode, unspecified: Secondary | ICD-10-CM | POA: Diagnosis not present

## 2015-05-03 DIAGNOSIS — Y9389 Activity, other specified: Secondary | ICD-10-CM | POA: Insufficient documentation

## 2015-05-03 DIAGNOSIS — S83242A Other tear of medial meniscus, current injury, left knee, initial encounter: Secondary | ICD-10-CM | POA: Insufficient documentation

## 2015-05-03 DIAGNOSIS — M25562 Pain in left knee: Secondary | ICD-10-CM | POA: Diagnosis present

## 2015-05-03 HISTORY — PX: KNEE ARTHROSCOPY: SHX127

## 2015-05-03 LAB — GLUCOSE, CAPILLARY: Glucose-Capillary: 87 mg/dL (ref 65–99)

## 2015-05-03 SURGERY — ARTHROSCOPY, KNEE
Anesthesia: General | Laterality: Left

## 2015-05-03 MED ORDER — HYDROCODONE-ACETAMINOPHEN 5-325 MG PO TABS: 1.0000 | ORAL_TABLET | ORAL | Status: DC | PRN

## 2015-05-03 MED ORDER — METOCLOPRAMIDE HCL 5 MG/ML IJ SOLN: 5.0000 mg | Freq: Three times a day (TID) | INTRAMUSCULAR | Status: DC | PRN

## 2015-05-03 MED ORDER — PROPOFOL 10 MG/ML IV BOLUS
INTRAVENOUS | Status: DC | PRN
  Administered 2015-05-03: 50 mg via INTRAVENOUS
  Administered 2015-05-03: 100 mg via INTRAVENOUS

## 2015-05-03 MED ORDER — FENTANYL CITRATE (PF) 100 MCG/2ML IJ SOLN
INTRAMUSCULAR | Status: DC | PRN
  Administered 2015-05-03 (×4): 25 ug via INTRAVENOUS

## 2015-05-03 MED ORDER — BUPIVACAINE-EPINEPHRINE 0.25% -1:200000 IJ SOLN
INTRAMUSCULAR | Status: DC | PRN
  Administered 2015-05-03: 30 mL

## 2015-05-03 MED ORDER — EPHEDRINE SULFATE 50 MG/ML IJ SOLN
INTRAMUSCULAR | Status: DC | PRN
  Administered 2015-05-03 (×2): 5 mg via INTRAVENOUS

## 2015-05-03 MED ORDER — SODIUM CHLORIDE 0.9 % IV SOLN: INTRAVENOUS | Status: DC

## 2015-05-03 MED ORDER — LACTATED RINGERS IV SOLN
INTRAVENOUS | Status: DC
  Administered 2015-05-03 (×2): via INTRAVENOUS

## 2015-05-03 MED ORDER — LIDOCAINE HCL (CARDIAC) 20 MG/ML IV SOLN
INTRAVENOUS | Status: DC | PRN
  Administered 2015-05-03: 30 mg via INTRAVENOUS

## 2015-05-03 MED ORDER — MORPHINE SULFATE (PF) 4 MG/ML IV SOLN
INTRAVENOUS | Status: AC
  Filled 2015-05-03: qty 1

## 2015-05-03 MED ORDER — ONDANSETRON HCL 4 MG PO TABS: 4.0000 mg | ORAL_TABLET | Freq: Four times a day (QID) | ORAL | Status: DC | PRN

## 2015-05-03 MED ORDER — MIDAZOLAM HCL 2 MG/2ML IJ SOLN
INTRAMUSCULAR | Status: DC | PRN
  Administered 2015-05-03 (×2): 0.5 mg via INTRAVENOUS

## 2015-05-03 MED ORDER — ONDANSETRON HCL 4 MG/2ML IJ SOLN: 4.0000 mg | Freq: Once | INTRAMUSCULAR | Status: DC | PRN

## 2015-05-03 MED ORDER — ACETAMINOPHEN 10 MG/ML IV SOLN
INTRAVENOUS | Status: DC | PRN
  Administered 2015-05-03: 1000 mg via INTRAVENOUS

## 2015-05-03 MED ORDER — METOCLOPRAMIDE HCL 10 MG PO TABS: 5.0000 mg | ORAL_TABLET | Freq: Three times a day (TID) | ORAL | Status: DC | PRN

## 2015-05-03 MED ORDER — ONDANSETRON HCL 4 MG/2ML IJ SOLN: 4.0000 mg | Freq: Four times a day (QID) | INTRAMUSCULAR | Status: DC | PRN

## 2015-05-03 MED ORDER — ONDANSETRON HCL 4 MG/2ML IJ SOLN
INTRAMUSCULAR | Status: DC | PRN
  Administered 2015-05-03: 4 mg via INTRAVENOUS

## 2015-05-03 MED ORDER — ACETAMINOPHEN 10 MG/ML IV SOLN
INTRAVENOUS | Status: AC
  Filled 2015-05-03: qty 100

## 2015-05-03 MED ORDER — FENTANYL CITRATE (PF) 100 MCG/2ML IJ SOLN: 25.0000 ug | INTRAMUSCULAR | Status: DC | PRN

## 2015-05-03 MED ORDER — BUPIVACAINE-EPINEPHRINE (PF) 0.25% -1:200000 IJ SOLN
INTRAMUSCULAR | Status: AC
  Filled 2015-05-03: qty 30

## 2015-05-03 SURGICAL SUPPLY — 23 items
BLADE SHAVER 4.5 DBL SERAT CV (CUTTER) ×2 IMPLANT
BNDG ESMARK 6X12 TAN STRL LF (GAUZE/BANDAGES/DRESSINGS) ×2 IMPLANT
DRSG DERMACEA 8X12 NADH (GAUZE/BANDAGES/DRESSINGS) ×2 IMPLANT
DURAPREP 26ML APPLICATOR (WOUND CARE) ×4 IMPLANT
GAUZE SPONGE 4X4 12PLY STRL (GAUZE/BANDAGES/DRESSINGS) ×2 IMPLANT
GLOVE BIOGEL M STRL SZ7.5 (GLOVE) ×2 IMPLANT
GLOVE INDICATOR 8.0 STRL GRN (GLOVE) ×2 IMPLANT
GOWN STRL REUS W/ TWL LRG LVL3 (GOWN DISPOSABLE) ×1 IMPLANT
GOWN STRL REUS W/ TWL LRG LVL4 (GOWN DISPOSABLE) ×1 IMPLANT
GOWN STRL REUS W/TWL LRG LVL3 (GOWN DISPOSABLE) ×1
GOWN STRL REUS W/TWL LRG LVL4 (GOWN DISPOSABLE) ×1
IV LACTATED RINGER IRRG 3000ML (IV SOLUTION) ×6
IV LR IRRIG 3000ML ARTHROMATIC (IV SOLUTION) ×6 IMPLANT
MANIFOLD NEPTUNE II (INSTRUMENTS) ×2 IMPLANT
PACK ARTHROSCOPY KNEE (MISCELLANEOUS) ×2 IMPLANT
SET TUBE SUCT SHAVER OUTFL 24K (TUBING) ×2 IMPLANT
SET TUBE TIP INTRA-ARTICULAR (MISCELLANEOUS) ×2 IMPLANT
STRAP SAFETY BODY (MISCELLANEOUS) ×2 IMPLANT
SUT ETHILON 3-0 FS-10 30 BLK (SUTURE) ×2
SUTURE EHLN 3-0 FS-10 30 BLK (SUTURE) ×1 IMPLANT
TUBING ARTHRO INFLOW-ONLY STRL (TUBING) ×2 IMPLANT
WAND HAND CNTRL MULTIVAC 50 (MISCELLANEOUS) ×2 IMPLANT
WRAP KNEE W/COLD PACKS 25.5X14 (SOFTGOODS) ×2 IMPLANT

## 2015-05-03 NOTE — Discharge Instructions (Addendum)
Instructions after Knee Arthroscopy    James P. Holley Bouche., M.D.     Dept. of [facility]  [address], [place]  [address]   Phone: [phone]   Fax: [phone]   DIET:  Drink plenty of non-alcoholic fluids & begin a light diet.  Resume your normal diet the day after surgery.  ACTIVITY:   You may use crutches or a walker with weight-bearing as tolerated, unless instructed otherwise.  You may wean yourself off of the walker or crutches as tolerated.   Begin doing gentle exercises. Exercising will reduce the pain and swelling, increase motion, and prevent muscle weakness.    Avoid strenuous activities or athletics for a minimum of 4-6 weeks after arthroscopic surgery.  Do not drive or operate any equipment until instructed.  WOUND CARE:   Place one to two pillows under the knee the first day or two when sitting or lying.   Continue to use the ice packs periodically to reduce pain and swelling.  The small incisions in your knee are closed with nylon stitches. The stitches will be removed in the office.  The bulky dressing may be removed on the second day after surgery. DO NOT TOUCH THE STITCHES. Put a Band-Aid over each stitch. Do NOT use any ointments or creams on the incisions.   You may bathe or shower after the stitches are removed at the first office visit following surgery.  MEDICATIONS:  You may resume your regular medications.  Please take the pain medication as prescribed.  Do not take pain medication on an empty stomach.  Do not drive or drink alcoholic beverages when taking pain medications.  CALL THE OFFICE FOR:  Temperature above 101 degrees  Excessive bleeding or drainage on the dressing.  Excessive swelling, coldness, or paleness of the toes.  Persistent nausea and vomiting.  FOLLOW-UP:   You should have an appointment to return to the office in 7-10 days after surgery.       General Anesthesia General anesthesia is a sleep-like state of non-feeling produced by medicines (anesthetics). General anesthesia prevents you from being alert and feeling pain during a medical procedure. Your caregiver may recommend general anesthesia if your procedure:  Is long.  Is painful or uncomfortable.  Would be frightening to see or hear.  Requires you to be still.  Affects your breathing.  Causes significant blood loss. LET YOUR CAREGIVER KNOW ABOUT:  Allergies to food or medicine.  Medicines taken, including vitamins, herbs, eyedrops, over-the-counter medicines, and creams.  Use of steroids (by mouth or creams).  Previous problems with anesthetics or numbing medicines, including problems experienced by relatives.  History of bleeding problems or blood clots.  Previous surgeries and types of anesthetics received.  Possibility of pregnancy, if this applies.  Use of cigarettes, alcohol, or illegal drugs.  Any health condition(s), especially diabetes, sleep apnea, and high blood pressure. RISKS AND COMPLICATIONS General anesthesia rarely causes complications. However, if complications do occur, they can be life threatening. Complications include:  A lung infection.  A stroke.  A heart attack.  Waking up during the procedure. When this occurs, the patient may be unable to move and communicate that he or she is awake. The patient may feel severe pain. Older adults and adults with serious medical problems are more likely to have complications than adults who are young and healthy. Some complications can be prevented by answering all of your caregiver's questions thoroughly and by following  all pre-procedure instructions. It is important to tell your caregiver if any of the pre-procedure instructions, especially those related to diet, were not followed. Any food or liquid in the stomach can cause problems when you are under general anesthesia. BEFORE THE  PROCEDURE  Ask your caregiver if you will have to spend the night at the hospital. If you will not have to spend the night, arrange to have an adult drive you and stay with you for 24 hours.  Follow your caregiver's instructions if you are taking dietary supplements or medicines. Your caregiver may tell you to stop taking them or to reduce your dosage.  Do not smoke for as long as possible before your procedure. If possible, stop smoking 3-6 weeks before the procedure.  Do not take new dietary supplements or medicines within 1 week of your procedure unless your caregiver approves them.  Do not eat within 8 hours of your procedure or as directed by your caregiver. Drink only clear liquids, such as water, black coffee (without milk or cream), and fruit juices (without pulp).  Do not drink within 3 hours of your procedure or as directed by your caregiver.  You may brush your teeth on the morning of the procedure, but make sure to spit out the toothpaste and water when finished. PROCEDURE  You will receive anesthetics through a mask, through an intravenous (IV) access tube, or through both. A doctor who specializes in anesthesia (anesthesiologist) or a nurse who specializes in anesthesia (nurse anesthetist) or both will stay with you throughout the procedure to make sure you remain unconscious. He or she will also watch your blood pressure, pulse, and oxygen levels to make sure that the anesthetics do not cause any problems. Once you are asleep, a breathing tube or mask may be used to help you breathe. AFTER THE PROCEDURE You will wake up after the procedure is complete. You may be in the room where the procedure was performed or in a recovery area. You may have a sore throat if a breathing tube was used. You may also feel:  Dizzy.  Weak.  Drowsy.  Confused.  Nauseous.  Cold. These are all normal responses and can be expected to last for up to 24 hours after the procedure is complete. A  caregiver will tell you when you are ready to go home. This will usually be when you are fully awake and in stable condition. Document Released: 11/05/2007 Document Revised: 12/13/2013 Document Reviewed: 11/27/2011 [facility] Patient Information 2015 [place], [place]. This information is not intended to replace advice given to you by your health care provider. Make sure you discuss any questions you have with your health care provider.

## 2015-05-03 NOTE — Progress Notes (Signed)
Script given for [place] prior to d/c

## 2015-05-03 NOTE — H&P (Signed)
The patient has been re-examined, and the chart reviewed, and there have been no interval changes to the documented history and physical.    The risks, benefits, and alternatives have been discussed at length. The patient expressed understanding of the risks benefits and agreed with plans for surgical intervention.  James P. Hooten, Jr. M.D.    

## 2015-05-03 NOTE — Op Note (Signed)
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   ASSISTANT: none  ANESTHESIA: general  ESTIMATED BLOOD LOSS: Minimal  FLUIDS REPLACED: 700 mL of crystalloid  TOURNIQUET TIME: Not used   DRAINS: none  IMPLANTS UTILIZED: None  INDICATIONS FOR SURGERY: [name] is a 81 y.o. year old female who has been seen for complaints of left knee pain. MRI demonstrated findings consistent with meniscal pathology. After discussion of the risks and benefits of surgical intervention, the patient expressed understanding of the risks benefits and agree with plans for left knee arthroscopy.   PROCEDURE IN DETAIL: The patient was brought into the operating room and, after adequate general anesthesia was achieved, a tourniquet was applied to the left thigh and the leg was placed in the leg holder. All bony prominences were well padded. The patient's left knee was cleaned and prepped with alcohol and Duraprep and draped in the usual sterile fashion. A "timeout" was performed as per usual protocol. The anticipated portal sites were injected with 0.25% Marcaine with epinephrine. An anterolateral incision was made and a cannula was inserted. A small effusion was evacuated and the knee was distended with fluid using the pump. The scope was advanced down the medial gutter into the medial compartment. Under visualization with the scope, an anteromedial portal was created and a hooked probe was inserted. The medial meniscus was visualized and probed. There was a complex tear of the  posterior horn of the medial meniscus. The meniscal tear was debrided using meniscal punches and a 4.5 mm incisor shaver. Final contouring was performed using a 50 ArthroCare wand. The articular cartilage was visualized. There was an area of grade 4 chondromalacia involving the medial tibial plateau and areas of grade 3 chondromalacia involving the medial femoral condyle. These areas were debrided and contoured using the ArthroCare wand.  The scope was then advanced into the intercondylar notch. The anterior cruciate ligament was visualized and probed and felt to be intact. The scope was removed from the lateral portal and reinserted via the anteromedial portal to better visualize the lateral compartment. The lateral meniscus was visualized and probed. There was no gross evidence of tear or instability to the lateral meniscus. The articular cartilage of the lateral compartment was visualized and noted be in good condition. Finally, the scope was advanced so as to visualize the patellofemoral articulation. Good patellar tracking was appreciated. There was an area of grade 3-4 chondromalacia involving the patella.  The knee was irrigated with copius amounts of fluid and suctioned dry. The anterolateral portal was re-approximated with #3-0 nylon. A combination of 0.25% Marcaine with epinephrine and 4 mg of Morphine were injected via the scope. The scope was removed and the anteromedial portal was re-approximated with #3-0 nylon. A sterile dressing was applied followed by application of an ice wrap.  The patient tolerated the procedure well and was transported to the PACU in stable condition.  James P. Holley Bouche., M.D.

## 2015-05-03 NOTE — Brief Op Note (Signed)
05/03/2015  4:38 PM  PATIENT:  [name]  81 y.o. female  PRE-OPERATIVE DIAGNOSIS:  INTERNAL DERANGEMENT - left knee  POST-OPERATIVE DIAGNOSIS:  medial meniscal tear, grade 4 chondromalacia (medial)  PROCEDURE:  Procedure(s): left knee arthroscopy, partial medial menisectomy, chondroplasty (Left)  SURGEON:  Surgeon(s) and Role:    * Dereck Leep, MD - Primary  ASSISTANTS: none   ANESTHESIA:   general  EBL:  Total I/O In: 73 [P.O.:120; I.V.:700] Out: 0   BLOOD ADMINISTERED:none  DRAINS: none   LOCAL MEDICATIONS USED:  MARCAINE     SPECIMEN:  No Specimen  DISPOSITION OF SPECIMEN:  N/A  COUNTS:  YES  TOURNIQUET:  * No tourniquets in log *  DICTATION: .Dragon Dictation  PLAN OF CARE: Discharge to home after PACU  PATIENT DISPOSITION:  PACU - hemodynamically stable.   Delay start of Pharmacological VTE agent (>24hrs) due to surgical blood loss or risk of bleeding: not applicable

## 2015-05-03 NOTE — Anesthesia Postprocedure Evaluation (Signed)
  Anesthesia Post-op Note  Patient: [name]  Procedure(s) Performed: Procedure(s): left knee arthroscopy, partial medial menisectomy, chondroplasty (Left)  Anesthesia type:General  Patient location: PACU  Post pain: Pain level controlled  Post assessment: Post-op Vital signs reviewed, Patient's Cardiovascular Status Stable, Respiratory Function Stable, Patent Airway and No signs of Nausea or vomiting  Post vital signs: Reviewed and stable  Last Vitals:  Filed Vitals:   05/03/15 1606  BP: 123/47  Pulse: 74  Temp:   Resp: 16    Level of consciousness: awake, alert  and patient cooperative  Complications: No apparent anesthesia complications

## 2015-05-03 NOTE — Anesthesia Preprocedure Evaluation (Addendum)
Anesthesia Evaluation  Patient identified by MRN, date of birth, ID band Patient awake    Reviewed: Allergy & Precautions, NPO status , Patient's Chart, lab work & pertinent test results, reviewed documented beta blocker date and time   Airway Mallampati: II  TM Distance: >3 FB     Dental  (+) Upper Dentures, Lower Dentures   Pulmonary shortness of breath, sleep apnea and Continuous Positive Airway Pressure Ventilation , pneumonia, resolved,           Cardiovascular      Neuro/Psych PSYCHIATRIC DISORDERS Anxiety Depression    GI/Hepatic GERD  ,  Endo/Other    Renal/GU      Musculoskeletal   Abdominal   Peds  Hematology   Anesthesia Other Findings Will use CPAP. No problem with food coming back up. Rarely has heartburn.  Reproductive/Obstetrics                           Anesthesia Physical Anesthesia Plan  ASA: III  Anesthesia Plan: General   Post-op Pain Management:    Induction: Intravenous  Airway Management Planned: LMA  Additional Equipment:   Intra-op Plan:   Post-operative Plan:   Informed Consent: I have reviewed the patients History and Physical, chart, labs and discussed the procedure including the risks, benefits and alternatives for the proposed anesthesia with the patient or authorized representative who has indicated his/her understanding and acceptance.     Plan Discussed with: CRNA  Anesthesia Plan Comments:         Anesthesia Quick Evaluation

## 2015-05-03 NOTE — Anesthesia Procedure Notes (Signed)
Procedure Name: LMA Insertion Date/Time: 05/03/2015 12:55 PM Performed by: Courtney Paris Pre-anesthesia Checklist: Patient identified, Emergency Drugs available, Suction available and Patient being monitored Patient Re-evaluated:Patient Re-evaluated prior to inductionOxygen Delivery Method: Circle system utilized Preoxygenation: Pre-oxygenation with 100% oxygen Intubation Type: IV induction and Combination inhalational/ intravenous induction Ventilation: Mask ventilation without difficulty LMA: LMA inserted LMA Size: 4.0 Grade View: Grade II Tube type: Oral Number of attempts: 1 Secured at: 22 cm Tube secured with: Tape Dental Injury: Teeth and Oropharynx as per pre-operative assessment

## 2015-05-03 NOTE — Transfer of Care (Signed)
Immediate Anesthesia Transfer of Care Note  Patient: [name]  Procedure(s) Performed: Procedure(s): left knee arthroscopy, partial medial menisectomy, chondroplasty (Left)  Patient Location: PACU  Anesthesia Type:General  Level of Consciousness: awake, oriented and patient cooperative  Airway & Oxygen Therapy: Patient Spontanous Breathing and Patient connected to face mask oxygen  Post-op Assessment: Report given to RN and Post -op Vital signs reviewed and stable  Post vital signs: Reviewed and stable  Last Vitals:  Filed Vitals:   05/03/15 1136  BP: 130/70  Pulse: 88  Temp: 36.7 C  Resp: 16    Complications: No apparent anesthesia complications

## 2015-06-12 ENCOUNTER — Ambulatory Visit (INDEPENDENT_AMBULATORY_CARE_PROVIDER_SITE_OTHER): Payer: Medicare Other | Admitting: Nurse Practitioner

## 2015-06-12 ENCOUNTER — Encounter: Payer: Self-pay | Admitting: Nurse Practitioner

## 2015-06-12 VITALS — BP 102/62 | HR 89 | Temp 98.4°F | Resp 12 | Ht 63.0 in | Wt 181.8 lb

## 2015-06-12 DIAGNOSIS — R300 Dysuria: Secondary | ICD-10-CM | POA: Insufficient documentation

## 2015-06-12 LAB — POCT URINALYSIS DIPSTICK
Bilirubin, UA: NEGATIVE
Glucose, UA: NEGATIVE
KETONES UA: NEGATIVE
Nitrite, UA: POSITIVE
PH UA: 5.5
Protein, UA: 100
Spec Grav, UA: >=1.03
Urobilinogen, UA: 0.2

## 2015-06-12 MED ORDER — CIPROFLOXACIN HCL 250 MG PO TABS: 250.0000 mg | ORAL_TABLET | Freq: Two times a day (BID) | ORAL | Status: DC

## 2015-06-12 NOTE — Patient Instructions (Signed)
Please take a probiotic ( Align, Floraque or Culturelle) while you are on the antibiotic to prevent a serious antibiotic associated diarrhea  Called clostirudium dificile colitis and a vaginal yeast infection.   Cipro 1 tablet twice daily x 5 days.  Call us if no improvement by 4 days. We will send culture results via MyChart.

## 2015-06-12 NOTE — Progress Notes (Signed)
Pre visit review using our clinic review tool, if applicable. No additional management support is needed unless otherwise documented below in the visit note. 

## 2015-06-12 NOTE — Assessment & Plan Note (Signed)
POCT urine shows probable urinary tract infection will obtain urine culture and treat with Cipro 250 mg twice a day for 5 days. Encouraged probiotics and water. We will send urine culture results via Mychart. Asked her to return to care if not improving after 4 days.

## 2015-06-12 NOTE — Progress Notes (Signed)
Patient ID: [name], female    DOB: [birth year]  Age: 81 y.o. MRN: [id number]  CC: Urinary Tract Infection   HPI [name] presents for UTI symptoms x 1 day possibly more. She is accompanied by her daughter [name].   1) Started last night with pain with urination, urgency, and confusion.  Forgetting sooner than normal per her daughter.   History [name] has a past medical history of Depression; GERD (gastroesophageal reflux disease); Anxiety; SOB (shortness of breath) on exertion; Sleep apnea; Pneumonia; and Sepsis.   She has past surgical history that includes Tonsillectomy; Endometrial ablation; Eye surgery (2011); and Knee arthroscopy (Left, 05/03/2015).   Her family history includes COPD in her father; Cancer in her cousin; Cancer (age of onset: 21) in her mother; Pernicious anemia in her mother.She reports that she has never smoked. She has never used smokeless tobacco. She reports that she does not drink alcohol or use illicit drugs.  Outpatient Prescriptions Prior to Visit  Medication Sig Dispense Refill  . ARIPiprazole (ABILIFY) 5 MG tablet Take 5 mg by mouth at bedtime.     Marland Kitchen aspirin 325 MG EC tablet Take 325 mg by mouth every 6 (six) hours as needed for pain.    Marland Kitchen buPROPion (WELLBUTRIN) 100 MG tablet Take 150 mg by mouth daily.     . Cholecalciferol (VITAMIN D-3 PO) Take 1 capsule by mouth daily. 1000iu    . cyanocobalamin 1000 MCG tablet Take 1,000 mcg by mouth daily.     Marland Kitchen escitalopram (LEXAPRO) 20 MG tablet Take 10 mg by mouth daily.     Marland Kitchen loperamide (IMODIUM) 2 MG capsule Take 2 mg by mouth 2 (two) times daily.    Marland Kitchen omeprazole (PRILOSEC) 20 MG capsule TAKE 1 TABLET (20 MG TOTAL) BY MOUTH DAILY. 30 capsule 9  . triamcinolone cream (KENALOG) 0.1 % Apply topically 2 (two) times daily. (Patient not taking: Reported on 06/12/2015) 454 g 1  . HYDROcodone-acetaminophen (NORCO) 5-325 MG per tablet Take 1-2 tablets by mouth every 4 (four) hours as needed for moderate pain.  (Patient not taking: Reported on 06/12/2015) 60 tablet 0  . HYDROcodone-acetaminophen (NORCO/VICODIN) 5-325 MG per tablet Take 1 tablet by mouth every 6 (six) hours as needed for moderate pain. (Patient not taking: Reported on 05/03/2015) 30 tablet 0   No facility-administered medications prior to visit.    ROS Review of Systems  Constitutional: Negative for fever, chills, diaphoresis and fatigue.  Genitourinary: Positive for dysuria, urgency and frequency. Negative for hematuria, flank pain, decreased urine volume, difficulty urinating and pelvic pain.  Skin: Negative for rash.  Psychiatric/Behavioral: Positive for confusion.    Objective:  BP 102/62 mmHg  Pulse 89  Temp(Src) 98.4 F (36.9 C)  Resp 12  Ht 5\' 3"  (1.6 m)  Wt 181 lb 12.8 oz (82.464 kg)  BMI 32.21 kg/m2  SpO2 96%  Physical Exam  Constitutional: She is oriented to person, place, and time. She appears well-developed and well-nourished. No distress.  HENT:  Head: Normocephalic and atraumatic.  Right Ear: External ear normal.  Left Ear: External ear normal.  Abdominal: There is no CVA tenderness.  Neurological: She is alert and oriented to person, place, and time.  Skin: Skin is warm and dry. No rash noted. She is not diaphoretic.  Psychiatric: She has a normal mood and affect. Her behavior is normal. Judgment and thought content normal.   Assessment & Plan:   Mikeila was seen today for urinary tract infection.  Diagnoses  and all orders for this visit:  Dysuria -     POCT Urinalysis Dipstick -     Urine culture  Other orders -     ciprofloxacin (CIPRO) 250 MG tablet; Take 1 tablet (250 mg total) by mouth 2 (two) times daily.   I have discontinued Ms. Seward's HYDROcodone-acetaminophen and HYDROcodone-acetaminophen. I am also having her start on ciprofloxacin. Additionally, I am having her maintain her ARIPiprazole, escitalopram, buPROPion, triamcinolone cream, Cholecalciferol (VITAMIN D-3 PO), cyanocobalamin,  aspirin, loperamide, and omeprazole.  Meds ordered this encounter  Medications  . ciprofloxacin (CIPRO) 250 MG tablet    Sig: Take 1 tablet (250 mg total) by mouth 2 (two) times daily.    Dispense:  10 tablet    Refill:  0    Order Specific Question:  Supervising Provider    Answer:  Crecencio Mc [[id number]]     Follow-up: Return if symptoms worsen or fail to improve.

## 2015-06-14 LAB — URINE CULTURE: Colony Count: >=100000

## 2015-06-26 ENCOUNTER — Encounter: Payer: Self-pay | Admitting: Internal Medicine

## 2015-06-26 ENCOUNTER — Ambulatory Visit (INDEPENDENT_AMBULATORY_CARE_PROVIDER_SITE_OTHER): Payer: Medicare Other | Admitting: Internal Medicine

## 2015-06-26 VITALS — BP 99/61 | HR 80 | Temp 98.6°F | Ht 62.25 in | Wt 188.0 lb

## 2015-06-26 DIAGNOSIS — F32A Depression, unspecified: Secondary | ICD-10-CM

## 2015-06-26 DIAGNOSIS — R7989 Other specified abnormal findings of blood chemistry: Secondary | ICD-10-CM | POA: Diagnosis not present

## 2015-06-26 DIAGNOSIS — H9190 Unspecified hearing loss, unspecified ear: Secondary | ICD-10-CM | POA: Insufficient documentation

## 2015-06-26 DIAGNOSIS — Z0000 Encounter for general adult medical examination without abnormal findings: Secondary | ICD-10-CM

## 2015-06-26 DIAGNOSIS — E210 Primary hyperparathyroidism: Secondary | ICD-10-CM

## 2015-06-26 DIAGNOSIS — Z23 Encounter for immunization: Secondary | ICD-10-CM

## 2015-06-26 DIAGNOSIS — H9193 Unspecified hearing loss, bilateral: Secondary | ICD-10-CM | POA: Diagnosis not present

## 2015-06-26 DIAGNOSIS — F329 Major depressive disorder, single episode, unspecified: Secondary | ICD-10-CM

## 2015-06-26 LAB — CBC WITH DIFFERENTIAL/PLATELET
BASOS ABS: 0 10*3/uL (ref 0.0–0.1)
Basophils Relative: 0.4 % (ref 0.0–3.0)
EOS ABS: 0 10*3/uL (ref 0.0–0.7)
Eosinophils Relative: 0 % (ref 0.0–5.0)
HCT: 41.6 % (ref 36.0–46.0)
Hemoglobin: 13.6 g/dL (ref 12.0–15.0)
LYMPHS ABS: 1.8 10*3/uL (ref 0.7–4.0)
Lymphocytes Relative: 32.3 % (ref 12.0–46.0)
MCHC: 32.7 g/dL (ref 30.0–36.0)
MCV: 88.5 fl (ref 78.0–100.0)
MONO ABS: 0.4 10*3/uL (ref 0.1–1.0)
Monocytes Relative: 7.1 % (ref 3.0–12.0)
NEUTROS ABS: 3.3 10*3/uL (ref 1.4–7.7)
NEUTROS PCT: 60.2 % (ref 43.0–77.0)
PLATELETS: 244 10*3/uL (ref 150.0–400.0)
RBC: 4.7 Mil/uL (ref 3.87–5.11)
RDW: 14.7 % (ref 11.5–15.5)
WBC: 5.5 10*3/uL (ref 4.0–10.5)

## 2015-06-26 LAB — COMPREHENSIVE METABOLIC PANEL
ALT: 13 U/L (ref 0–35)
AST: 14 U/L (ref 0–37)
Albumin: 3.8 g/dL (ref 3.5–5.2)
Alkaline Phosphatase: 63 U/L (ref 39–117)
BILIRUBIN TOTAL: 0.4 mg/dL (ref 0.2–1.2)
BUN: 15 mg/dL (ref 6–23)
CO2: 28 meq/L (ref 19–32)
Calcium: 10.7 mg/dL — ABNORMAL HIGH (ref 8.4–10.5)
Chloride: 105 mEq/L (ref 96–112)
Creatinine, Ser: 1.07 mg/dL (ref 0.40–1.20)
GFR: 52.27 mL/min — AB (ref >60.00)
GLUCOSE: 90 mg/dL (ref 70–99)
Potassium: 4.3 mEq/L (ref 3.5–5.1)
Sodium: 141 mEq/L (ref 135–145)
Total Protein: 6.3 g/dL (ref 6.0–8.3)

## 2015-06-26 LAB — LIPID PANEL
Cholesterol: 178 mg/dL (ref 0–200)
HDL: 41.9 mg/dL (ref >39.00)
NONHDL: 136.29
Total CHOL/HDL Ratio: 4
Triglycerides: 292 mg/dL — ABNORMAL HIGH (ref 0.0–149.0)
VLDL: 58.4 mg/dL — ABNORMAL HIGH (ref 0.0–40.0)

## 2015-06-26 LAB — LDL CHOLESTEROL, DIRECT: LDL DIRECT: 105 mg/dL

## 2015-06-26 NOTE — Assessment & Plan Note (Signed)
Will check PTH and Ca with labs.

## 2015-06-26 NOTE — Assessment & Plan Note (Signed)
Will set up hearing testing.

## 2015-06-26 NOTE — Assessment & Plan Note (Signed)
Symptoms controlled. Will make referral for new psychiatrist.

## 2015-06-26 NOTE — Progress Notes (Signed)
Pre visit review using our clinic review tool, if applicable. No additional management support is needed unless otherwise documented below in the visit note. 

## 2015-06-26 NOTE — Patient Instructions (Signed)
Health Maintenance, Female Adopting a healthy lifestyle and getting preventive care can go a long way to promote health and wellness. Talk with your health care provider about what schedule of regular examinations is right for you. This is a good chance for you to check in with your provider about disease prevention and staying healthy. In between checkups, there are plenty of things you can do on your own. Experts have done a lot of research about which lifestyle changes and preventive measures are most likely to keep you healthy. Ask your health care provider for more information. WEIGHT AND DIET  Eat a healthy diet  Be sure to include plenty of vegetables, fruits, low-fat dairy products, and lean protein.  Do not eat a lot of foods high in solid fats, added sugars, or salt.  Get regular exercise. This is one of the most important things you can do for your health.  Most adults should exercise for at least 150 minutes each week. The exercise should increase your heart rate and make you sweat (moderate-intensity exercise).  Most adults should also do strengthening exercises at least twice a week. This is in addition to the moderate-intensity exercise.  Maintain a healthy weight  Body mass index (BMI) is a measurement that can be used to identify possible weight problems. It estimates body fat based on height and weight. Your health care provider can help determine your BMI and help you achieve or maintain a healthy weight.  For females 20 years of age and older:   A BMI below 18.5 is considered underweight.  A BMI of 18.5 to 24.9 is normal.  A BMI of 25 to 29.9 is considered overweight.  A BMI of 30 and above is considered obese.  Watch levels of cholesterol and blood lipids  You should start having your blood tested for lipids and cholesterol at 79 years of age, then have this test every 5 years.  You may need to have your cholesterol levels checked more often if:  Your lipid  or cholesterol levels are high.  You are older than 79 years of age.  You are at high risk for heart disease.  CANCER SCREENING   Lung Cancer  Lung cancer screening is recommended for adults 55-80 years old who are at high risk for lung cancer because of a history of smoking.  A yearly low-dose CT scan of the lungs is recommended for people who:  Currently smoke.  Have quit within the past 15 years.  Have at least a 30-pack-year history of smoking. A pack year is smoking an average of one pack of cigarettes a day for 1 year.  Yearly screening should continue until it has been 15 years since you quit.  Yearly screening should stop if you develop a health problem that would prevent you from having lung cancer treatment.  Breast Cancer  Practice breast self-awareness. This means understanding how your breasts normally appear and feel.  It also means doing regular breast self-exams. Let your health care provider know about any changes, no matter how small.  If you are in your 20s or 30s, you should have a clinical breast exam (CBE) by a health care provider every 1-3 years as part of a regular health exam.  If you are 40 or older, have a CBE every year. Also consider having a breast X-ray (mammogram) every year.  If you have a family history of breast cancer, talk to your health care provider about genetic screening.  If you   are at high risk for breast cancer, talk to your health care provider about having an MRI and a mammogram every year.  Breast cancer gene (BRCA) assessment is recommended for women who have family members with BRCA-related cancers. BRCA-related cancers include:  Breast.  Ovarian.  Tubal.  Peritoneal cancers.  Results of the assessment will determine the need for genetic counseling and BRCA1 and BRCA2 testing. Cervical Cancer Your health care provider may recommend that you be screened regularly for cancer of the pelvic organs (ovaries, uterus, and  vagina). This screening involves a pelvic examination, including checking for microscopic changes to the surface of your cervix (Pap test). You may be encouraged to have this screening done every 3 years, beginning at age 21.  For women ages 30-65, health care providers may recommend pelvic exams and Pap testing every 3 years, or they may recommend the Pap and pelvic exam, combined with testing for human papilloma virus (HPV), every 5 years. Some types of HPV increase your risk of cervical cancer. Testing for HPV may also be done on women of any age with unclear Pap test results.  Other health care providers may not recommend any screening for nonpregnant women who are considered low risk for pelvic cancer and who do not have symptoms. Ask your health care provider if a screening pelvic exam is right for you.  If you have had past treatment for cervical cancer or a condition that could lead to cancer, you need Pap tests and screening for cancer for at least 20 years after your treatment. If Pap tests have been discontinued, your risk factors (such as having a new sexual partner) need to be reassessed to determine if screening should resume. Some women have medical problems that increase the chance of getting cervical cancer. In these cases, your health care provider may recommend more frequent screening and Pap tests. Colorectal Cancer  This type of cancer can be detected and often prevented.  Routine colorectal cancer screening usually begins at 79 years of age and continues through 79 years of age.  Your health care provider may recommend screening at an earlier age if you have risk factors for colon cancer.  Your health care provider may also recommend using home test kits to check for hidden blood in the stool.  A small camera at the end of a tube can be used to examine your colon directly (sigmoidoscopy or colonoscopy). This is done to check for the earliest forms of colorectal  cancer.  Routine screening usually begins at age 50.  Direct examination of the colon should be repeated every 5-10 years through 79 years of age. However, you may need to be screened more often if early forms of precancerous polyps or small growths are found. Skin Cancer  Check your skin from head to toe regularly.  Tell your health care provider about any new moles or changes in moles, especially if there is a change in a mole's shape or color.  Also tell your health care provider if you have a mole that is larger than the size of a pencil eraser.  Always use sunscreen. Apply sunscreen liberally and repeatedly throughout the day.  Protect yourself by wearing long sleeves, pants, a wide-brimmed hat, and sunglasses whenever you are outside. HEART DISEASE, DIABETES, AND HIGH BLOOD PRESSURE   High blood pressure causes heart disease and increases the risk of stroke. High blood pressure is more likely to develop in:  People who have blood pressure in the high end   of the normal range (130-139/85-89 mm Hg).  People who are overweight or obese.  People who are African American.  If you are 38-23 years of age, have your blood pressure checked every 3-5 years. If you are 61 years of age or older, have your blood pressure checked every year. You should have your blood pressure measured twice--once when you are at a hospital or clinic, and once when you are not at a hospital or clinic. Record the average of the two measurements. To check your blood pressure when you are not at a hospital or clinic, you can use:  An automated blood pressure machine at a pharmacy.  A home blood pressure monitor.  If you are between 45 years and 39 years old, ask your health care provider if you should take aspirin to prevent strokes.  Have regular diabetes screenings. This involves taking a blood sample to check your fasting blood sugar level.  If you are at a normal weight and have a low risk for diabetes,  have this test once every three years after 79 years of age.  If you are overweight and have a high risk for diabetes, consider being tested at a younger age or more often. PREVENTING INFECTION  Hepatitis B  If you have a higher risk for hepatitis B, you should be screened for this virus. You are considered at high risk for hepatitis B if:  You were born in a country where hepatitis B is common. Ask your health care provider which countries are considered high risk.  Your parents were born in a high-risk country, and you have not been immunized against hepatitis B (hepatitis B vaccine).  You have HIV or AIDS.  You use needles to inject street drugs.  You live with someone who has hepatitis B.  You have had sex with someone who has hepatitis B.  You get hemodialysis treatment.  You take certain medicines for conditions, including cancer, organ transplantation, and autoimmune conditions. Hepatitis C  Blood testing is recommended for:  Everyone born from [birth year] through 1963.  Anyone with known risk factors for hepatitis C. Sexually transmitted infections (STIs)  You should be screened for sexually transmitted infections (STIs) including gonorrhea and chlamydia if:  You are sexually active and are younger than 79 years of age.  You are older than 79 years of age and your health care provider tells you that you are at risk for this type of infection.  Your sexual activity has changed since you were last screened and you are at an increased risk for chlamydia or gonorrhea. Ask your health care provider if you are at risk.  If you do not have HIV, but are at risk, it may be recommended that you take a prescription medicine daily to prevent HIV infection. This is called pre-exposure prophylaxis (PrEP). You are considered at risk if:  You are sexually active and do not regularly use condoms or know the HIV status of your partner(s).  You take drugs by injection.  You are sexually  active with a partner who has HIV. Talk with your health care provider about whether you are at high risk of being infected with HIV. If you choose to begin PrEP, you should first be tested for HIV. You should then be tested every 3 months for as long as you are taking PrEP.  PREGNANCY   If you are premenopausal and you may become pregnant, ask your health care provider about preconception counseling.  If you may  become pregnant, take 400 to 800 micrograms (mcg) of folic acid every day.  If you want to prevent pregnancy, talk to your health care provider about birth control (contraception). OSTEOPOROSIS AND MENOPAUSE   Osteoporosis is a disease in which the bones lose minerals and strength with aging. This can result in serious bone fractures. Your risk for osteoporosis can be identified using a bone density scan.  If you are 61 years of age or older, or if you are at risk for osteoporosis and fractures, ask your health care provider if you should be screened.  Ask your health care provider whether you should take a calcium or vitamin D supplement to lower your risk for osteoporosis.  Menopause may have certain physical symptoms and risks.  Hormone replacement therapy may reduce some of these symptoms and risks. Talk to your health care provider about whether hormone replacement therapy is right for you.  HOME CARE INSTRUCTIONS   Schedule regular health, dental, and eye exams.  Stay current with your immunizations.   Do not use any tobacco products including cigarettes, chewing tobacco, or electronic cigarettes.  If you are pregnant, do not drink alcohol.  If you are breastfeeding, limit how much and how often you drink alcohol.  Limit alcohol intake to no more than 1 drink per day for nonpregnant women. One drink equals 12 ounces of beer, 5 ounces of wine, or 1 ounces of hard liquor.  Do not use street drugs.  Do not share needles.  Ask your health care provider for help if  you need support or information about quitting drugs.  Tell your health care provider if you often feel depressed.  Tell your health care provider if you have ever been abused or do not feel safe at home.   This information is not intended to replace advice given to you by your health care provider. Make sure you discuss any questions you have with your health care provider.   Document Released: 02/11/2011 Document Revised: 08/19/2014 Document Reviewed: 06/30/2013 Elsevier Interactive Patient Education [employer].

## 2015-06-26 NOTE — Progress Notes (Signed)
Subjective:    Patient ID: DICEY SUMMA, female    DOB: 05-07-1932, 81 y.o.   MRN: [id number]  HPI  The patient is here for annual Medicare wellness examination and management of other chronic and acute problems.   The risk factors are reflected in the social history.  The roster of all physicians providing medical care to patient - is listed in the Snapshot section of the chart.  Activities of daily living:  The patient is 100% independent in all ADLs: dressing, toileting, feeding as well as independent mobility. Lives with daughter.  Home safety : The patient has smoke detectors in the home. They wear seatbelts.  There are no firearms at home. There is no violence in the home. Has ordered a medical alert device. Falls prevention set up in home, eg bathroom chair, handrails.  There is no risks for hepatitis, STDs or HIV. There is no history of blood transfusion. They have no travel history to infectious disease endemic areas of the world.  The patient has not seen their dentist in the last six month. Has dentures. Occasional trouble with fit of dentures. They have seen their eye doctor in the last year. Dr. Ellin Mayhew Occasional issues with hearing soft voices.They have deferred audiologic testing in the last year.   They do not have excessive sun exposure. Discussed the need for sun protection: hats, long sleeves and use of sunscreen if there is significant sun exposure. Dermatologist - Dr. Phillip Heal  Diet: the importance of a healthy diet is discussed. They do have a relatively healthy diet. Basically eats what she wants.  The benefits of regular aerobic exercise were discussed. She is not physically active. Some fatigue noted with physical activity.  Depression screen: there are no signs or vegative symptoms of depression- irritability, change in appetite, anhedonia, sadness/tearfullness. Symptoms of depression stable. Her psychiatrist, Dr. Bridgett Larsson, is leaving and she will need a new  psychiatrist.  Cognitive assessment: the patient manages all their financial and personal affairs and is actively engaged. They could relate day,date,year and events.  The following portions of the patient's history were reviewed and updated as appropriate: allergies, current medications, past family history, past medical history,  past surgical history, past social history  and problem list.  Visual acuity was not assessed per patient preference since she has regular follow up with her ophthalmologist. Hearing and body mass index were assessed and reviewed.   During the course of the visit the patient was educated and counseled about appropriate screening and preventive services including : fall prevention , diabetes screening, nutrition counseling, colorectal cancer screening, and recommended immunizations.     Wt Readings from Last 3 Encounters:  06/26/15 188 lb (85.276 kg)  06/12/15 181 lb 12.8 oz (82.464 kg)  05/03/15 180 lb (81.647 kg)   BP Readings from Last 3 Encounters:  06/26/15 99/61  06/12/15 102/62  05/03/15 123/47    Past Medical History  Diagnosis Date  . Depression   . GERD (gastroesophageal reflux disease)   . Anxiety   . SOB (shortness of breath) on exertion   . Sleep apnea   . Pneumonia   . Sepsis ([place])    Family History  Problem Relation Age of Onset  . Cancer Mother 36    ovarian   . Pernicious anemia Mother   . COPD Father     nonsmoker  . Cancer Cousin     ovarian ca   Past Surgical History  Procedure Laterality Date  . Tonsillectomy    .  Endometrial ablation    . Eye surgery  2011    left cataract  . Knee arthroscopy Left 05/03/2015    Procedure: left knee arthroscopy, partial medial menisectomy, chondroplasty;  Surgeon: Dereck Leep, MD;  Location: [facility] ORS;  Service: Orthopedics;  Laterality: Left;   Social History   Social History  . Marital Status: Widowed    Spouse Name: N/A  . Number of Children: N/A  . Years of Education: N/A    Social History Main Topics  . Smoking status: Never Smoker   . Smokeless tobacco: Never Used  . Alcohol Use: No  . Drug Use: No  . Sexual Activity: Not Asked   Other Topics Concern  . None   Social History Narrative   Regular exercise: not really   Caffeine use: some    Review of Systems  Constitutional: Negative for fever, chills, appetite change, fatigue and unexpected weight change.  Eyes: Negative for visual disturbance.  Respiratory: Negative for shortness of breath.   Cardiovascular: Negative for chest pain and leg swelling.  Gastrointestinal: Negative for nausea, vomiting, abdominal pain, diarrhea and constipation.  Musculoskeletal: Negative for myalgias and arthralgias.  Skin: Negative for color change and rash.  Neurological: Negative for weakness.  Hematological: Negative for adenopathy. Does not bruise/bleed easily.  Psychiatric/Behavioral: Negative for suicidal ideas, sleep disturbance and dysphoric mood. The patient is not nervous/anxious.        Objective:    BP 99/61 mmHg  Pulse 80  Temp(Src) 98.6 F (37 C) (Oral)  Ht 5' 2.25" (1.581 m)  Wt 188 lb (85.276 kg)  BMI 34.12 kg/m2  SpO2 97% Physical Exam  Constitutional: She is oriented to person, place, and time. She appears well-developed and well-nourished. No distress.  HENT:  Head: Normocephalic and atraumatic.  Right Ear: External ear normal.  Left Ear: External ear normal.  Nose: Nose normal.  Mouth/Throat: Oropharynx is clear and moist. No oropharyngeal exudate.  Eyes: Conjunctivae are normal. Pupils are equal, round, and reactive to light. Right eye exhibits no discharge. Left eye exhibits no discharge. No scleral icterus.  Neck: Normal range of motion. Neck supple. No tracheal deviation present. No thyromegaly present.  Cardiovascular: Normal rate, regular rhythm, normal heart sounds and intact distal pulses.  Exam reveals no gallop and no friction rub.   No murmur heard. Pulmonary/Chest:  Effort normal and breath sounds normal. No respiratory distress. She has no wheezes. She has no rales. She exhibits no tenderness.  Musculoskeletal: Normal range of motion. She exhibits no edema or tenderness.  Lymphadenopathy:    She has no cervical adenopathy.  Neurological: She is alert and oriented to person, place, and time. No cranial nerve deficit. She exhibits normal muscle tone. Coordination normal.  Skin: Skin is warm and dry. No rash noted. She is not diaphoretic. No erythema. No pallor.  Psychiatric: She has a normal mood and affect. Her behavior is normal. Judgment and thought content normal.          Assessment & Plan:  Patient was given a handout regarding current recommendations for health maintenance and preventative care on the AVS.  Problem List Items Addressed This Visit      Unprioritized   Depression    Symptoms controlled. Will make referral for new psychiatrist.      Relevant Orders   Ambulatory referral to Psychiatry   Hearing loss    Will set up hearing testing.      Relevant Orders   Ambulatory referral to Audiology  Medicare annual wellness visit, subsequent - Primary    General medical exam normal today. Encouraged healthy diet and exercise. Mammogram declined. Colonoscopy declined. Prevnar vaccine today. Labs as ordered.      Relevant Orders   CBC with Differential/Platelet   Comprehensive metabolic panel   Lipid panel   Primary hyperparathyroidism ([place])    Will check PTH and Ca with labs.      Relevant Orders   PTH, Intact and Calcium       Return in about 6 months (around 12/24/2015) for Recheck.

## 2015-06-26 NOTE — Assessment & Plan Note (Addendum)
General medical exam normal today. Encouraged healthy diet and exercise. Mammogram declined. Colonoscopy declined. Prevnar vaccine today. Labs as ordered.

## 2015-06-26 NOTE — Addendum Note (Signed)
Addended by: Vernetta Honey on: 06/26/2015 02:47 PM   Modules accepted: Orders

## 2015-06-27 LAB — PTH, INTACT AND CALCIUM
Calcium: 10.5 mg/dL (ref 8.4–10.5)
PTH: 58 pg/mL (ref 14–64)

## 2015-07-18 ENCOUNTER — Ambulatory Visit: Payer: Medicare Other | Admitting: Podiatry

## 2015-07-19 ENCOUNTER — Other Ambulatory Visit: Payer: Self-pay | Admitting: Internal Medicine

## 2015-07-19 DIAGNOSIS — F32A Depression, unspecified: Secondary | ICD-10-CM

## 2015-07-19 DIAGNOSIS — F329 Major depressive disorder, single episode, unspecified: Secondary | ICD-10-CM

## 2015-07-25 ENCOUNTER — Ambulatory Visit (INDEPENDENT_AMBULATORY_CARE_PROVIDER_SITE_OTHER): Payer: Medicare Other | Admitting: Podiatry

## 2015-07-25 DIAGNOSIS — M79676 Pain in unspecified toe(s): Secondary | ICD-10-CM | POA: Diagnosis not present

## 2015-07-25 DIAGNOSIS — B351 Tinea unguium: Secondary | ICD-10-CM

## 2015-07-26 NOTE — Progress Notes (Signed)
Subjective: 81 y.o.-year-old female returns the office today for painful, elongated, thickened toenails which she is unable to trim herself. Denies any redness or drainage around the nails. Denies any acute changes since last appointment and no new complaints today. Denies any systemic complaints such as fevers, chills, nausea, vomiting.   Objective: AAO 3, NAD DP/PT pulses palpable, CRT less than 3 seconds Nails hypertrophic, dystrophic, elongated, brittle, discolored 10. There is tenderness to nails 1-5 bilaterally. There is no surrounding erythema or drainage along the nail sites. No open lesions or pre-ulcerative lesions are identified. No other areas of tenderness bilateral lower extremities. No overlying edema, erythema, increased warmth. No pain with calf compression, swelling, warmth, erythema.  Assessment: Patient presents with symptomatic onychomycosis  Plan: -Treatment options including alternatives, risks, complications were discussed -Nails sharply debrided 10 without complication/bleeding. -Discussed daily foot inspection. If there are any changes, to call the office immediately.  -Follow-up in 3 months or sooner if any problems are to arise. In the meantime, encouraged to call the office with any questions, concerns, changes symptoms.    Matthew Wagoner, DPM 

## 2015-08-16 ENCOUNTER — Other Ambulatory Visit: Payer: Self-pay | Admitting: *Deleted

## 2015-08-16 MED ORDER — ARIPIPRAZOLE 5 MG PO TABS: 5.0000 mg | ORAL_TABLET | Freq: Every day | ORAL | Status: DC

## 2015-08-16 MED ORDER — ESCITALOPRAM OXALATE 20 MG PO TABS: 10.0000 mg | ORAL_TABLET | Freq: Every day | ORAL | Status: DC

## 2015-08-16 MED ORDER — OMEPRAZOLE 20 MG PO CPDR: DELAYED_RELEASE_CAPSULE | ORAL | Status: DC

## 2015-08-16 MED ORDER — BUPROPION HCL 100 MG PO TABS: 150.0000 mg | ORAL_TABLET | Freq: Every day | ORAL | Status: DC

## 2015-08-16 NOTE — Telephone Encounter (Signed)
Please advise refill, as these were filled by Dr. Bridgett Larsson in the past.  Thanks?

## 2015-08-16 NOTE — Telephone Encounter (Signed)
[facility] in [place] is the preferred pharmacy.

## 2015-08-16 NOTE — Telephone Encounter (Signed)
Patients daughter has requested a medication refill for ARIPiprazole (ABILIFY),omeprazole (PRILOSEC) 20 MG ,escitalopram (LEXAPRO) and buPROPion [facility]) . Dr. Bridgett Larsson was her physiatrist and has moved on to practice else where. Patient requested Dr. Gilford Rile to write Rx until patient has a new physiatrist, please advise

## 2015-08-21 ENCOUNTER — Ambulatory Visit: Payer: Medicare Other | Admitting: Internal Medicine

## 2015-08-30 ENCOUNTER — Ambulatory Visit (INDEPENDENT_AMBULATORY_CARE_PROVIDER_SITE_OTHER): Payer: Medicare Other | Admitting: Internal Medicine

## 2015-08-30 ENCOUNTER — Encounter: Payer: Self-pay | Admitting: Internal Medicine

## 2015-08-30 ENCOUNTER — Ambulatory Visit
Admission: RE | Admit: 2015-08-30 | Discharge: 2015-08-30 | Disposition: A | Discharge status: Home / Self Care | Payer: Medicare Other | Source: Ambulatory Visit | Attending: Internal Medicine | Admitting: Internal Medicine

## 2015-08-30 VITALS — BP 112/68 | HR 78 | Temp 98.4°F | Ht 62.0 in | Wt 190.4 lb

## 2015-08-30 DIAGNOSIS — R109 Unspecified abdominal pain: Secondary | ICD-10-CM | POA: Diagnosis not present

## 2015-08-30 DIAGNOSIS — K5730 Diverticulosis of large intestine without perforation or abscess without bleeding: Secondary | ICD-10-CM | POA: Diagnosis not present

## 2015-08-30 DIAGNOSIS — E210 Primary hyperparathyroidism: Secondary | ICD-10-CM | POA: Diagnosis not present

## 2015-08-30 DIAGNOSIS — N281 Cyst of kidney, acquired: Secondary | ICD-10-CM | POA: Diagnosis not present

## 2015-08-30 DIAGNOSIS — D259 Leiomyoma of uterus, unspecified: Secondary | ICD-10-CM | POA: Insufficient documentation

## 2015-08-30 DIAGNOSIS — R10A1 Flank pain, right side: Secondary | ICD-10-CM | POA: Insufficient documentation

## 2015-08-30 DIAGNOSIS — R399 Unspecified symptoms and signs involving the genitourinary system: Secondary | ICD-10-CM | POA: Insufficient documentation

## 2015-08-30 LAB — POCT URINALYSIS DIPSTICK
Bilirubin, UA: NEGATIVE
Glucose, UA: NEGATIVE
KETONES UA: NEGATIVE
Nitrite, UA: NEGATIVE
PH UA: 5
PROTEIN UA: NEGATIVE
Spec Grav, UA: >=1.03
Urobilinogen, UA: 0.2

## 2015-08-30 MED ORDER — CIPROFLOXACIN HCL 500 MG PO TABS: 500.0000 mg | ORAL_TABLET | Freq: Two times a day (BID) | ORAL | Status: DC

## 2015-08-30 MED ORDER — TRIAMCINOLONE ACETONIDE 0.1 % EX CREA: TOPICAL_CREAM | Freq: Two times a day (BID) | CUTANEOUS | Status: DC

## 2015-08-30 NOTE — Assessment & Plan Note (Signed)
Intermittent right flank pain over 1 year. Recently worsened with hematuria. Symptoms concerning for nephrolithiasis. Pt has primary hyperparathyroidism, putting her at risk for this. CT renal protocol. Urine culture sent. Follow up after scan complete.

## 2015-08-30 NOTE — Patient Instructions (Signed)
Start Cipro 500mg  twice daily.  We will set up CT scan today to evaluate for kidney stone.  Follow up if symptoms are not improving.

## 2015-08-30 NOTE — Assessment & Plan Note (Signed)
Recent PTH and calcium were stable. Will continue to monitor q6 months.

## 2015-08-30 NOTE — Progress Notes (Signed)
Pre visit review using our clinic review tool, if applicable. No additional management support is needed unless otherwise documented below in the visit note. 

## 2015-08-30 NOTE — Assessment & Plan Note (Signed)
UA pos for leuk and blood. Urine culture sent. Start Cipro.

## 2015-08-30 NOTE — Progress Notes (Signed)
Subjective:    Patient ID: [name], female    DOB: 12/05/1931, 82 y.o.   MRN: [id number]  HPI  82YO female presents for acute visit.  Flank pain - right sided. Going on for about 1 year. Described as sharp in right flank. No fever. Pain does not radiate. No NV. No changes in bowel habits. Not taking anything for pain. Occasional gross blood in urine.  Wt Readings from Last 3 Encounters:  08/30/15 190 lb 6 oz (86.354 kg)  06/26/15 188 lb (85.276 kg)  06/12/15 181 lb 12.8 oz (82.464 kg)   BP Readings from Last 3 Encounters:  08/30/15 112/68  06/26/15 99/61  06/12/15 102/62    Past Medical History  Diagnosis Date  . Depression   . GERD (gastroesophageal reflux disease)   . Anxiety   . SOB (shortness of breath) on exertion   . Sleep apnea   . Pneumonia   . Sepsis ([place])    Family History  Problem Relation Age of Onset  . Cancer Mother 49    ovarian   . Pernicious anemia Mother   . COPD Father     nonsmoker  . Cancer Cousin     ovarian ca   Past Surgical History  Procedure Laterality Date  . Tonsillectomy    . Endometrial ablation    . Eye surgery  2011    left cataract  . Knee arthroscopy Left 05/03/2015    Procedure: left knee arthroscopy, partial medial menisectomy, chondroplasty;  Surgeon: Dereck Leep, MD;  Location: [facility] ORS;  Service: Orthopedics;  Laterality: Left;   Social History   Social History  . Marital Status: Widowed    Spouse Name: N/A  . Number of Children: N/A  . Years of Education: N/A   Social History Main Topics  . Smoking status: Never Smoker   . Smokeless tobacco: Never Used  . Alcohol Use: No  . Drug Use: No  . Sexual Activity: Not Asked   Other Topics Concern  . None   Social History Narrative   Regular exercise: not really   Caffeine use: some    Review of Systems  Constitutional: Negative for fever, chills and fatigue.  Gastrointestinal: Negative for nausea, vomiting, abdominal pain, diarrhea, constipation and  rectal pain.  Genitourinary: Positive for hematuria and flank pain. Negative for dysuria, urgency, frequency, decreased urine volume, vaginal bleeding, vaginal discharge, difficulty urinating, vaginal pain and pelvic pain.       Objective:    BP 112/68 mmHg  Pulse 78  Temp(Src) 98.4 F (36.9 C) (Oral)  Ht 5\' 2"  (1.575 m)  Wt 190 lb 6 oz (86.354 kg)  BMI 34.81 kg/m2  SpO2 95% Physical Exam  Constitutional: She is oriented to person, place, and time. She appears well-developed and well-nourished. No distress.  HENT:  Head: Normocephalic and atraumatic.  Right Ear: External ear normal.  Left Ear: External ear normal.  Nose: Nose normal.  Mouth/Throat: Oropharynx is clear and moist.  Eyes: Conjunctivae are normal. Pupils are equal, round, and reactive to light. Right eye exhibits no discharge. Left eye exhibits no discharge. No scleral icterus.  Neck: Normal range of motion. Neck supple. No tracheal deviation present. No thyromegaly present.  Cardiovascular: Normal rate, regular rhythm, normal heart sounds and intact distal pulses.  Exam reveals no gallop and no friction rub.   No murmur heard. Pulmonary/Chest: Effort normal and breath sounds normal. No respiratory distress. She has no wheezes. She has no rales. She exhibits  no tenderness.  Abdominal: There is no tenderness (no current flank tenderness).  Musculoskeletal: Normal range of motion. She exhibits no edema or tenderness.  Lymphadenopathy:    She has no cervical adenopathy.  Neurological: She is alert and oriented to person, place, and time. No cranial nerve deficit. She exhibits normal muscle tone. Coordination normal.  Skin: Skin is warm and dry. No rash noted. She is not diaphoretic. No erythema. No pallor.  Psychiatric: She has a normal mood and affect. Her behavior is normal. Judgment and thought content normal.          Assessment & Plan:   Problem List Items Addressed This Visit      Unprioritized   Primary  hyperparathyroidism ([place])    Recent PTH and calcium were stable. Will continue to monitor q6 months.      Right flank pain - Primary    Intermittent right flank pain over 1 year. Recently worsened with hematuria. Symptoms concerning for nephrolithiasis. Pt has primary hyperparathyroidism, putting her at risk for this. CT renal protocol. Urine culture sent. Follow up after scan complete.      Relevant Orders   CT RENAL STONE STUDY   UTI symptoms    UA pos for leuk and blood. Urine culture sent. Start Cipro.       Relevant Orders   POCT urinalysis dipstick (Completed)   Urine culture       Return in about 4 weeks (around 09/27/2015) for Recheck.

## 2015-08-31 LAB — URINE CULTURE
COLONY COUNT: NO GROWTH
ORGANISM ID, BACTERIA: NO GROWTH

## 2015-09-11 ENCOUNTER — Ambulatory Visit: Payer: Medicare Other | Admitting: Psychiatry

## 2015-09-28 DIAGNOSIS — J399 Disease of upper respiratory tract, unspecified: Secondary | ICD-10-CM | POA: Diagnosis not present

## 2015-10-09 ENCOUNTER — Ambulatory Visit (INDEPENDENT_AMBULATORY_CARE_PROVIDER_SITE_OTHER): Payer: Medicare Other | Admitting: Psychiatry

## 2015-10-09 ENCOUNTER — Encounter: Payer: Self-pay | Admitting: Psychiatry

## 2015-10-09 VITALS — BP 120/68 | HR 82 | Temp 97.2°F | Ht 62.0 in | Wt 190.0 lb

## 2015-10-09 DIAGNOSIS — F331 Major depressive disorder, recurrent, moderate: Secondary | ICD-10-CM | POA: Diagnosis not present

## 2015-10-09 DIAGNOSIS — F411 Generalized anxiety disorder: Secondary | ICD-10-CM | POA: Diagnosis not present

## 2015-10-09 MED ORDER — ESCITALOPRAM OXALATE 10 MG PO TABS: 10.0000 mg | ORAL_TABLET | Freq: Every day | ORAL | Status: DC

## 2015-10-09 MED ORDER — BUSPIRONE HCL 5 MG PO TABS: 5.0000 mg | ORAL_TABLET | Freq: Every day | ORAL | Status: DC

## 2015-10-09 NOTE — Progress Notes (Signed)
Psychiatric Initial Adult Assessment   Patient Identification: [name] MRN:  [id number] Date of Evaluation:  10/09/2015 Referral Source: Ronette Deter, M.D  Chief Complaint:   Chief Complaint    Anxiety; Establish Care     Visit Diagnosis:    ICD-9-CM ICD-10-CM   1. MDD (major depressive disorder), recurrent episode, moderate (HCC) 296.32 F33.1   2. Anxiety state 300.00 F41.1    Diagnosis:   Patient Active Problem List   Diagnosis Date Noted  . UTI symptoms [R39.9] 08/30/2015  . Right flank pain [R10.9] 08/30/2015  . Hearing loss [H91.90] 06/26/2015  . Major depressive disorder with single episode ([place]) [F32.9] 04/15/2015  . OSA (obstructive sleep apnea) [G47.33] 10/27/2014  . Left knee pain [M25.562] 10/27/2014  . Obstructive apnea [G47.33] 10/27/2014  . Urge incontinence of urine [N39.41] 04/22/2014  . Incomplete uterine prolapse [N81.2] 04/22/2014  . Cystocele [N81.10] 12/29/2013  . Onychomycosis [B35.1] 08/09/2013  . Dermatophytic onychia [B35.1] 08/09/2013  . Primary hyperparathyroidism ([place]) [E21.0] 05/28/2013  . Medicare annual wellness visit, subsequent [Z00.00] 05/07/2013  . Stasis dermatitis [I83.10] 05/07/2013  . 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 Diarrhea [R19.7] 11/11/2012  . Closed fracture of humerus, supracondylar [S42.413A] 10/09/2012  . GERD (gastroesophageal reflux disease) [K21.9] 06/29/2012  . Acid reflux [K21.9] 06/29/2012  . Depression [F32.9]    History of Present Illness:    Patient is a 82 year old female who presented  for initial assessment accompanied by her daughter. She was referred by her primary care physician Ronette Deter. Patient reported that she was following Dr. Bridgett Larsson for at least 3-4 years but we do not have access to his records. Patient reported that she has been diagnosed with depression and anxiety and has been taking a combination of Wellbutrin and Lexapro and Abilify. Her  daughter reported that she is very anxious and will jump out with every small noise. She is also paranoid and thinks that people are talking about her. Her daughter reported that she has difficulty sleeping at night and will catch up on her sleep every third night. Initially she was very tired and Dr. Bridgett Larsson change the dose of her Abilify to nighttime doses. Patient reported that she has difficulty adjusting to her CPAP machine as well. Her daughter thinks that her memory is good. I did a Mini-Mental status exam on the patient and she scored 27 out of 30. She was able to remember but had difficulty in construction and abstract thinking. Patient reported that she has good relationship with her daughter and she currently denied having any perceptual disturbances. She denied having any suicidal homicidal ideations or plans. She feels nervous and mild tremors were noted in her hands.   Elements:  Severity:  mild. Associated Signs/Symptoms: Depression Symptoms:  depressed mood, insomnia, fatigue, impaired memory, anxiety, disturbed sleep, (Hypo) Manic Symptoms:  none noted Anxiety Symptoms:  Excessive Worry, Psychotic Symptoms:  Delusions, PTSD Symptoms: Negative NA  Past Medical History:  Past Medical History  Diagnosis Date  . Depression   . GERD (gastroesophageal reflux disease)   . Anxiety   . SOB (shortness of breath) on exertion   . Sleep apnea   . Pneumonia   . Sepsis [facility])     Past Surgical History  Procedure Laterality Date  . Tonsillectomy    . Endometrial ablation    . Eye surgery  2011    left cataract  . Knee arthroscopy Left 05/03/2015    Procedure: left knee arthroscopy, partial medial  menisectomy, chondroplasty;  Surgeon: Dereck Leep, MD;  Location: [facility] ORS;  Service: Orthopedics;  Laterality: Left;   Family History:  Family History  Problem Relation Age of Onset  . Cancer Mother 21    ovarian   . Pernicious anemia Mother   . COPD Father     nonsmoker  .  Cancer Cousin     ovarian ca   Social History:   Social History   Social History  . Marital Status: Widowed    Spouse Name: N/A  . Number of Children: N/A  . Years of Education: N/A   Social History Main Topics  . Smoking status: Never Smoker   . Smokeless tobacco: Never Used  . Alcohol Use: No  . Drug Use: No  . Sexual Activity: Not Currently   Other Topics Concern  . None   Social History Narrative   Regular exercise: not really   Caffeine use: some   Additional Social History:  She lives with her daughter.  Has 2 daughters and 2 sons. One daughter killed in car wreck. She was only married x 1. Divorced x 30 years.  Worked briefly for 5 years.    Musculoskeletal: Strength & Muscle Tone: decreased Gait & Station: unsteady Patient leans: N/A  Psychiatric Specialty Exam: HPI  ROS  Blood pressure 120/68, pulse 82, temperature 97.2 F (36.2 C), temperature source Tympanic, height 5\' 2"  (1.575 m), weight 190 lb (86.183 kg), SpO2 94 %.Body mass index is 34.74 kg/(m^2).  General Appearance: Casual  Eye Contact:  Fair  Speech:  Slow  Volume:  Decreased  Mood:  Anxious and Depressed  Affect:  Flat  Thought Process:  Loose  Orientation:  Full (Time, Place, and Person)  Thought Content:  Paranoid Ideation  Suicidal Thoughts:  No  Homicidal Thoughts:  No  Memory:  Immediate;   Fair  Judgement:  Fair  Insight:  Fair  Psychomotor Activity:  Psychomotor Retardation  Concentration:  Fair  Recall:  [employer] of Knowledge:Fair  Language: Fair  Akathisia:  No  Handed:  Right  AIMS (if indicated):    Assets:  Communication Skills Desire for Improvement Physical Health Social Support  ADL's:  Intact  Cognition: WNL  Sleep:  5-6   Is the patient at risk to self?  No. Has the patient been a risk to self in the past 6 months?  No. Has the patient been a risk to self within the distant past?  No. Is the patient a risk to others?  No. Has the patient been a risk to  others in the past 6 months?  No. Has the patient been a risk to others within the distant past?  No.  Allergies:   Allergies  Allergen Reactions  . Codeine Other (See Comments)    Crying spells  . Darvon [Propoxyphene Hcl] Other (See Comments)    Crying spells  . Propoxyphene Other (See Comments)    Crying, altered mental status   Current Medications: Current Outpatient Prescriptions  Medication Sig Dispense Refill  . ARIPiprazole (ABILIFY) 5 MG tablet Take 1 tablet (5 mg total) by mouth at bedtime. 30 tablet 3  . aspirin 325 MG EC tablet Take 325 mg by mouth every 6 (six) hours as needed for pain.    Marland Kitchen buPROPion (WELLBUTRIN) 100 MG tablet Take 1.5 tablets (150 mg total) by mouth daily. 45 tablet 3  . Cholecalciferol (VITAMIN D-3 PO) Take 1 capsule by mouth daily. 1000iu    . cyanocobalamin  1000 MCG tablet Take 1,000 mcg by mouth daily.     Marland Kitchen escitalopram (LEXAPRO) 20 MG tablet Take 0.5 tablets (10 mg total) by mouth daily. 15 tablet 3  . loperamide (IMODIUM) 2 MG capsule Take 2 mg by mouth 2 (two) times daily.    Marland Kitchen omeprazole (PRILOSEC) 20 MG capsule TAKE 1 TABLET (20 MG TOTAL) BY MOUTH DAILY. 30 capsule 3  . triamcinolone cream (KENALOG) 0.1 % Apply topically 2 (two) times daily. 454 g 1   No current facility-administered medications for this visit.    Previous Psychotropic Medications:  Does not remember.  No psychiatric hospitalization.    Substance Abuse History in the last 12 months:  No.  Consequences of Substance Abuse: Negative NA  Medical Decision Making:  Review of Psycho-Social Stressors (1) and Review and summation of old records (2)  Treatment Plan Summary: Medication management   Discussed patient and her daughter about the medications treatment risks benefits and alternatives. I will discontinue Wellbutrin at this time as patient is becoming more anxious and paranoid related to the medication. She will continue on Lexapro 10 mg in the morning. I will  start her on BuSpar 5 mg at lunchtime. She will continue on the Abilify 5 mg in the evening. We discussed about the option of starting her on medication to help with her cognitive decline and will review the medications at her next appointment. She will follow-up in one month or earlier depending on her symptoms   More than 50% of the time spent in psychoeducation, counseling and coordination of care.  Time spent with the patient 60 minutes   This note was generated in part or whole with voice recognition software. Voice regonition is usually quite accurate but there are transcription errors that can and very often do occur. I apologize for any typographical errors that were not detected and corrected.     Rainey Pines, MD  2/27/20171:12 PM

## 2015-10-24 ENCOUNTER — Encounter: Payer: Self-pay | Admitting: Podiatry

## 2015-10-24 ENCOUNTER — Ambulatory Visit (INDEPENDENT_AMBULATORY_CARE_PROVIDER_SITE_OTHER): Payer: Medicare Other | Admitting: Podiatry

## 2015-10-24 DIAGNOSIS — M79676 Pain in unspecified toe(s): Secondary | ICD-10-CM

## 2015-10-24 DIAGNOSIS — B351 Tinea unguium: Secondary | ICD-10-CM

## 2015-10-24 NOTE — Progress Notes (Signed)
Patient ID: BOWEN BONTON, female   DOB: 02/26/1932, 82 y.o.   MRN: [id number]  Subjective: 82 y.o.-year-old female returns the office today for painful, elongated, thickened toenails which she is unable to trim herself. Denies any redness or drainage around the nails. Denies any acute changes since last appointment and no new complaints today. Denies any systemic complaints such as fevers, chills, nausea, vomiting.   Objective: AAO 3, NAD DP/PT pulses palpable, CRT less than 3 seconds Nails hypertrophic, dystrophic, elongated, brittle, discolored 10. There is tenderness to nails 1-5 bilaterally. There is no surrounding erythema or drainage along the nail sites. No open lesions or pre-ulcerative lesions are identified. No other areas of tenderness bilateral lower extremities. No overlying edema, erythema, increased warmth. No pain with calf compression, swelling, warmth, erythema.  Assessment: Patient presents with symptomatic onychomycosis  Plan: -Treatment options including alternatives, risks, complications were discussed -Nails sharply debrided 10 without complication/bleeding. -Discussed daily foot inspection. If there are any changes, to call the office immediately.  -Follow-up in 3 months or sooner if any problems are to arise. In the meantime, encouraged to call the office with any questions, concerns, changes symptoms.    Celesta Gentile, DPM

## 2015-11-01 ENCOUNTER — Ambulatory Visit (INDEPENDENT_AMBULATORY_CARE_PROVIDER_SITE_OTHER): Payer: No Typology Code available for payment source | Admitting: Psychiatry

## 2015-11-01 ENCOUNTER — Encounter: Payer: Self-pay | Admitting: Psychiatry

## 2015-11-01 VITALS — BP 118/62 | HR 84 | Temp 97.7°F | Ht 62.0 in | Wt 194.8 lb

## 2015-11-01 DIAGNOSIS — F411 Generalized anxiety disorder: Secondary | ICD-10-CM | POA: Diagnosis not present

## 2015-11-01 DIAGNOSIS — F331 Major depressive disorder, recurrent, moderate: Secondary | ICD-10-CM

## 2015-11-01 MED ORDER — ARIPIPRAZOLE 5 MG PO TABS: 5.0000 mg | ORAL_TABLET | Freq: Every day | ORAL | Status: DC

## 2015-11-01 MED ORDER — BUSPIRONE HCL 5 MG PO TABS: 5.0000 mg | ORAL_TABLET | Freq: Every day | ORAL | Status: DC

## 2015-11-01 MED ORDER — ESCITALOPRAM OXALATE 10 MG PO TABS: 10.0000 mg | ORAL_TABLET | Freq: Every day | ORAL | Status: DC

## 2015-11-01 NOTE — Progress Notes (Signed)
Psychiatric MD Progress Note  Patient Identification: [name] MRN:  [id number] Date of Evaluation:  11/01/2015 Referral Source: Ronette Deter, M.D  Chief Complaint:   Chief Complaint    Follow-up; Medication Refill     Visit Diagnosis:    ICD-9-CM ICD-10-CM   1. MDD (major depressive disorder), recurrent episode, moderate (HCC) 296.32 F33.1   2. Anxiety state 300.00 F41.1    Diagnosis:   Patient Active Problem List   Diagnosis Date Noted  . UTI symptoms [R39.9] 08/30/2015  . Right flank pain [R10.9] 08/30/2015  . Hearing loss [H91.90] 06/26/2015  . Major depressive disorder with single episode ([place]) [F32.9] 04/15/2015  . OSA (obstructive sleep apnea) [G47.33] 10/27/2014  . Left knee pain [M25.562] 10/27/2014  . Obstructive apnea [G47.33] 10/27/2014  . Urge incontinence of urine [N39.41] 04/22/2014  . Incomplete uterine prolapse [N81.2] 04/22/2014  . Cystocele [N81.10] 12/29/2013  . Onychomycosis [B35.1] 08/09/2013  . Dermatophytic onychia [B35.1] 08/09/2013  . Primary hyperparathyroidism ([place]) [E21.0] 05/28/2013  . Medicare annual wellness visit, subsequent [Z00.00] 05/07/2013  . Stasis dermatitis [I83.10] 05/07/2013  . 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 Diarrhea [R19.7] 11/11/2012  . Closed fracture of humerus, supracondylar [S42.413A] 10/09/2012  . GERD (gastroesophageal reflux disease) [K21.9] 06/29/2012  . Acid reflux [K21.9] 06/29/2012  . Depression [F32.9]    History of Present Illness:    Patient is a 82 year old female who presented  for follow-up accompanied by her daughter. She reported that she is currently having a bad day today due to feeling constipated. Patient reported that she has issues with constipation and diarrhea. Patient reported that she takes medications for the same on a regular basis. Her daughter reported that they have tried to adjust her medications and keep forgetting to give her BuSpar at  lunchtime. Patient reported that she has been taking Lexapro in the morning and the Abilify at night. She feels tired in the evening. She does not feel depressed and her symptoms are improving. She currently denied having any depression and mood swings. Her memory is the same and is not interested in having her medications adjusted at this time.Patient reported that she has good relationship with her daughter and she currently denied having any perceptual disturbances. She denied having any suicidal homicidal ideations or plans.  No tremors noted in her hand at this time  Patient does not have any delusions or hallucinations and has been responding well to the current combination of medications  Past Medical History:  Past Medical History  Diagnosis Date  . Depression   . GERD (gastroesophageal reflux disease)   . Anxiety   . SOB (shortness of breath) on exertion   . Sleep apnea   . Pneumonia   . Sepsis [facility])     Past Surgical History  Procedure Laterality Date  . Tonsillectomy    . Endometrial ablation    . Eye surgery  2011    left cataract  . Knee arthroscopy Left 05/03/2015    Procedure: left knee arthroscopy, partial medial menisectomy, chondroplasty;  Surgeon: Dereck Leep, MD;  Location: [facility] ORS;  Service: Orthopedics;  Laterality: Left;   Family History:  Family History  Problem Relation Age of Onset  . Cancer Mother 17    ovarian   . Pernicious anemia Mother   . COPD Father     nonsmoker  . Cancer Cousin     ovarian ca   Social History:   Social History   Social History  .  Marital Status: Widowed    Spouse Name: N/A  . Number of Children: N/A  . Years of Education: N/A   Social History Main Topics  . Smoking status: Never Smoker   . Smokeless tobacco: Never Used  . Alcohol Use: No  . Drug Use: No  . Sexual Activity: Not Currently   Other Topics Concern  . None   Social History Narrative   Regular exercise: not really   Caffeine use: some    Additional Social History:  She lives with her daughter.  Has 2 daughters and 2 sons. One daughter killed in car wreck. She was only married x 1. Divorced x 30 years.  Worked briefly for 5 years.    Musculoskeletal: Strength & Muscle Tone: decreased Gait & Station: unsteady Patient leans: N/A  Psychiatric Specialty Exam: HPI   ROS   Blood pressure 118/62, pulse 84, temperature 97.7 F (36.5 C), temperature source Tympanic, height 5\' 2"  (1.575 m), weight 194 lb 12.8 oz (88.361 kg), SpO2 94 %.Body mass index is 35.62 kg/(m^2).  General Appearance: Casual  Eye Contact:  Fair  Speech:  Slow  Volume:  Decreased  Mood:  Anxious and Depressed  Affect:  Flat  Thought Process:  Loose  Orientation:  Full (Time, Place, and Person)  Thought Content:  WDL  Suicidal Thoughts:  No  Homicidal Thoughts:  No  Memory:  Immediate;   Fair  Judgement:  Fair  Insight:  Fair  Psychomotor Activity:  Psychomotor Retardation  Concentration:  Fair  Recall:  [employer] of Knowledge:Fair  Language: Fair  Akathisia:  No  Handed:  Right  AIMS (if indicated):    Assets:  Communication Skills Desire for Improvement Physical Health Social Support  ADL's:  Intact  Cognition: WNL  Sleep:  5-6   Is the patient at risk to self?  No. Has the patient been a risk to self in the past 6 months?  No. Has the patient been a risk to self within the distant past?  No. Is the patient a risk to others?  No. Has the patient been a risk to others in the past 6 months?  No. Has the patient been a risk to others within the distant past?  No.  Allergies:   Allergies  Allergen Reactions  . Codeine Other (See Comments)    Crying spells  . Darvon [Propoxyphene Hcl] Other (See Comments)    Crying spells  . Propoxyphene Other (See Comments)    Crying, altered mental status   Current Medications: Current Outpatient Prescriptions  Medication Sig Dispense Refill  . ARIPiprazole (ABILIFY) 5 MG tablet Take 1  tablet (5 mg total) by mouth at bedtime. 30 tablet 3  . aspirin 325 MG EC tablet Take 325 mg by mouth every 6 (six) hours as needed for pain.    . busPIRone (BUSPAR) 5 MG tablet Take 1 tablet (5 mg total) by mouth daily. 30 tablet 1  . Cholecalciferol (VITAMIN D-3 PO) Take 1 capsule by mouth daily. 1000iu    . escitalopram (LEXAPRO) 10 MG tablet Take 1 tablet (10 mg total) by mouth daily. 90 tablet 1  . loperamide (IMODIUM) 2 MG capsule Take 2 mg by mouth 2 (two) times daily.    Marland Kitchen omeprazole (PRILOSEC) 20 MG capsule TAKE 1 TABLET (20 MG TOTAL) BY MOUTH DAILY. 30 capsule 3  . triamcinolone cream (KENALOG) 0.1 % Apply topically 2 (two) times daily. 454 g 1   No current facility-administered medications for this  visit.    Previous Psychotropic Medications:  Does not remember.  No psychiatric hospitalization.    Substance Abuse History in the last 12 months:  No.  Consequences of Substance Abuse: Negative NA  Medical Decision Making:  Review of Psycho-Social Stressors (1) and Review and summation of old records (2)  Treatment Plan Summary: Medication management   Discussed patient and her daughter about the medications treatment risks benefits and alternatives.  She will continue on Lexapro 10 mg in the morning. Continue BuSpar 5 mg daily She will continue on the Abilify 5 mg in the evening.  She will follow-up in two month or earlier depending on her symptoms   More than 50% of the time spent in psychoeducation, counseling and coordination of care.  Time spent with the patient 25 mins.    This note was generated in part or whole with voice recognition software. Voice regonition is usually quite accurate but there are transcription errors that can and very often do occur. I apologize for any typographical errors that were not detected and corrected.     Rainey Pines, MD  3/22/20172:31 PM

## 2015-11-02 ENCOUNTER — Ambulatory Visit: Payer: Medicare Other | Admitting: Psychiatry

## 2015-11-23 ENCOUNTER — Ambulatory Visit (INDEPENDENT_AMBULATORY_CARE_PROVIDER_SITE_OTHER): Payer: Medicare Other | Admitting: Nurse Practitioner

## 2015-11-23 ENCOUNTER — Encounter: Payer: Self-pay | Admitting: Nurse Practitioner

## 2015-11-23 VITALS — BP 114/60 | HR 81 | Temp 99.0°F | Resp 14 | Ht 62.0 in | Wt 194.4 lb

## 2015-11-23 DIAGNOSIS — J302 Other seasonal allergic rhinitis: Secondary | ICD-10-CM | POA: Diagnosis not present

## 2015-11-23 DIAGNOSIS — R399 Unspecified symptoms and signs involving the genitourinary system: Secondary | ICD-10-CM

## 2015-11-23 LAB — POCT URINALYSIS DIPSTICK
BILIRUBIN UA: NEGATIVE
GLUCOSE UA: NEGATIVE
KETONES UA: NEGATIVE
NITRITE UA: NEGATIVE
Protein, UA: 30
Spec Grav, UA: 1.02
Urobilinogen, UA: 0.2
pH, UA: 5.5

## 2015-11-23 MED ORDER — CIPROFLOXACIN HCL 500 MG PO TABS: 500.0000 mg | ORAL_TABLET | Freq: Two times a day (BID) | ORAL | Status: DC

## 2015-11-23 NOTE — Progress Notes (Signed)
Pre visit review using our clinic review tool, if applicable. No additional management support is needed unless otherwise documented below in the visit note. 

## 2015-11-23 NOTE — Progress Notes (Signed)
Patient ID: [name], female    DOB: 02/21/1932  Age: 82 y.o. MRN: [id number]  CC: Urinary Frequency   HPI [name] presents for CC of dysuria x 1 day. She is accompanied by her daughter today.  1) Possibly more days of UTI symptoms per daughter.  Last night she began with dysuria and frequency. No OTC treatment to date Patient denies hematuria, urgency, decrease in urination amount  2) daughter reports patient has been sniffling and having rhinorrhea that is clear, occasional dry cough, no treatment to date   History Shanthi has a past medical history of Depression; GERD (gastroesophageal reflux disease); Anxiety; SOB (shortness of breath) on exertion; Sleep apnea; Pneumonia; and Sepsis ([place]).   She has past surgical history that includes Tonsillectomy; Endometrial ablation; Eye surgery (2011); and Knee arthroscopy (Left, 05/03/2015).   Her family history includes COPD in her father; Cancer in her cousin; Cancer (age of onset: 51) in her mother; Pernicious anemia in her mother.She reports that she has never smoked. She has never used smokeless tobacco. She reports that she does not drink alcohol or use illicit drugs.  Outpatient Prescriptions Prior to Visit  Medication Sig Dispense Refill  . ARIPiprazole (ABILIFY) 5 MG tablet Take 1 tablet (5 mg total) by mouth at bedtime. 30 tablet 3  . aspirin 325 MG EC tablet Take 325 mg by mouth every 6 (six) hours as needed for pain.    . busPIRone (BUSPAR) 5 MG tablet Take 1 tablet (5 mg total) by mouth daily. 30 tablet 2  . Cholecalciferol (VITAMIN D-3 PO) Take 1 capsule by mouth daily. 1000iu    . escitalopram (LEXAPRO) 10 MG tablet Take 1 tablet (10 mg total) by mouth daily. 30 tablet 2  . loperamide (IMODIUM) 2 MG capsule Take 2 mg by mouth 2 (two) times daily.    Marland Kitchen omeprazole (PRILOSEC) 20 MG capsule TAKE 1 TABLET (20 MG TOTAL) BY MOUTH DAILY. 30 capsule 3  . triamcinolone cream (KENALOG) 0.1 % Apply topically 2 (two) times daily. 454  g 1   No facility-administered medications prior to visit.    ROS Review of Systems  Constitutional: Negative for fever, chills, diaphoresis and fatigue.  HENT: Positive for postnasal drip, rhinorrhea and sneezing. Negative for congestion and sinus pressure.   Genitourinary: Positive for dysuria and frequency. Negative for urgency, hematuria, flank pain, decreased urine volume, difficulty urinating and pelvic pain.  Skin: Negative for rash.    Objective:  BP 114/60 mmHg  Pulse 81  Temp(Src) 99 F (37.2 C) (Oral)  Resp 14  Ht 5\' 2"  (1.575 m)  Wt 194 lb 6.4 oz (88.179 kg)  BMI 35.55 kg/m2  SpO2 95%  Physical Exam  Constitutional: She is oriented to person, place, and time. She appears well-developed and well-nourished. No distress.  HENT:  Head: Normocephalic and atraumatic.  Right Ear: External ear normal.  Left Ear: External ear normal.  Mouth/Throat: Oropharynx is clear and moist. No oropharyngeal exudate.  Boggy turbinates  Abdominal: There is no CVA tenderness.  Neurological: She is alert and oriented to person, place, and time.  Skin: Skin is warm and dry. No rash noted. She is not diaphoretic.  Psychiatric: She has a normal mood and affect. Her behavior is normal. Judgment and thought content normal.   Assessment & Plan:   Lany was seen today for urinary frequency.  Diagnoses and all orders for this visit:  Other seasonal allergic rhinitis  UTI symptoms -  POCT Urinalysis Dipstick -     ciprofloxacin (CIPRO) 500 MG tablet; Take 1 tablet (500 mg total) by mouth 2 (two) times daily.   I am having Ms. Needle start on ciprofloxacin. I am also having her maintain her Cholecalciferol (VITAMIN D-3 PO), aspirin, loperamide, omeprazole, triamcinolone cream, ARIPiprazole, busPIRone, and escitalopram.  Meds ordered this encounter  Medications  . ciprofloxacin (CIPRO) 500 MG tablet    Sig: Take 1 tablet (500 mg total) by mouth 2 (two) times daily.    Dispense:  14  tablet    Refill:  0    Order Specific Question:  Supervising Provider    Answer:  Crecencio Mc [[id number]]     Follow-up: Return if symptoms worsen or fail to improve.

## 2015-11-23 NOTE — Progress Notes (Deleted)
Patient received education resource, including the self-management goal and tool. Patient verbalized understanding. 

## 2015-11-23 NOTE — Patient Instructions (Signed)
Zyrtec daily  Cipro twice daily for 7 days.

## 2015-12-01 ENCOUNTER — Other Ambulatory Visit: Payer: Self-pay | Admitting: Internal Medicine

## 2015-12-01 DIAGNOSIS — Z760 Encounter for issue of repeat prescription: Secondary | ICD-10-CM

## 2015-12-03 DIAGNOSIS — J309 Allergic rhinitis, unspecified: Secondary | ICD-10-CM | POA: Insufficient documentation

## 2015-12-03 NOTE — Assessment & Plan Note (Signed)
Zyrtec OTC is recommended at nighttime

## 2015-12-03 NOTE — Assessment & Plan Note (Signed)
Patient did not give enough urine for a culture to be obtained today. UA was probable for urinary tract infection Start Cipro once again has been helpful in past without side effects with patient Follow-up with PCP if continuing to have symptoms after treatment

## 2015-12-13 ENCOUNTER — Ambulatory Visit (INDEPENDENT_AMBULATORY_CARE_PROVIDER_SITE_OTHER): Payer: No Typology Code available for payment source | Admitting: Psychiatry

## 2015-12-13 ENCOUNTER — Encounter: Payer: Self-pay | Admitting: Psychiatry

## 2015-12-13 VITALS — BP 124/72 | HR 86 | Temp 97.6°F | Ht 62.0 in | Wt 194.8 lb

## 2015-12-13 DIAGNOSIS — F411 Generalized anxiety disorder: Secondary | ICD-10-CM

## 2015-12-13 DIAGNOSIS — F331 Major depressive disorder, recurrent, moderate: Secondary | ICD-10-CM

## 2015-12-13 MED ORDER — ESCITALOPRAM OXALATE 10 MG PO TABS: 10.0000 mg | ORAL_TABLET | Freq: Every day | ORAL | Status: DC

## 2015-12-13 MED ORDER — BUSPIRONE HCL 5 MG PO TABS: 5.0000 mg | ORAL_TABLET | Freq: Every day | ORAL | Status: DC

## 2015-12-13 MED ORDER — ARIPIPRAZOLE 10 MG PO TABS: 10.0000 mg | ORAL_TABLET | Freq: Every day | ORAL | Status: DC

## 2015-12-13 NOTE — Progress Notes (Signed)
Psychiatric MD Progress Note  Patient Identification: [name] MRN:  [id number] Date of Evaluation:  12/13/2015 Referral Source: Ronette Deter, M.D  Chief Complaint:   Chief Complaint    Follow-up; Medication Refill     Visit Diagnosis:    ICD-9-CM ICD-10-CM   1. MDD (major depressive disorder), recurrent episode, moderate (HCC) 296.32 F33.1   2. Anxiety state 300.00 F41.1    Diagnosis:   Patient Active Problem List   Diagnosis Date Noted  . Allergic rhinitis [J30.9] 12/03/2015  . UTI symptoms [R39.9] 08/30/2015  . Right flank pain [R10.9] 08/30/2015  . Hearing loss [H91.90] 06/26/2015  . Major depressive disorder with single episode ([place]) [F32.9] 04/15/2015  . OSA (obstructive sleep apnea) [G47.33] 10/27/2014  . Left knee pain [M25.562] 10/27/2014  . Obstructive apnea [G47.33] 10/27/2014  . Urge incontinence of urine [N39.41] 04/22/2014  . Incomplete uterine prolapse [N81.2] 04/22/2014  . Cystocele [N81.10] 12/29/2013  . Onychomycosis [B35.1] 08/09/2013  . Dermatophytic onychia [B35.1] 08/09/2013  . Primary hyperparathyroidism ([place]) [E21.0] 05/28/2013  . Medicare annual wellness visit, subsequent [Z00.00] 05/07/2013  . Stasis dermatitis [I83.10] 05/07/2013  . 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 Diarrhea [R19.7] 11/11/2012  . Closed fracture of humerus, supracondylar [S42.413A] 10/09/2012  . GERD (gastroesophageal reflux disease) [K21.9] 06/29/2012  . Acid reflux [K21.9] 06/29/2012  . Depression [F32.9]    History of Present Illness:    Patient is a 82 year old female who presented  for follow-up accompanied by her daughter. Her daughter reported that she has just finished a course of antibiotic as she has been getting recurrent UTIs. Patient appeared calm during the interview. Her daughter also mentioned changed she does not drink enough fluids during the daytime. She is concerned about having diarrhea and abdominal pain and  she avoids water. She does not like the taste of [place] her. Patient remained quite during the interview as she is having hearing problems. Daughter stated that she takes naps during the daytime. She is responding well to her medications and is improving. She is not experiencing any side effects from the medication at this time. Her memory is the same and she is not is interested in having her medications adjusted. She sleeps well at night. She currently denied having any perceptual disturbances. She does not feel tired in the evening and will help her daughter with the daily chores occasionally.   Patient does not have any delusions or hallucinations and has been responding well to the current combination of medications  Past Medical History:  Past Medical History  Diagnosis Date  . Depression   . GERD (gastroesophageal reflux disease)   . Anxiety   . SOB (shortness of breath) on exertion   . Sleep apnea   . Pneumonia   . Sepsis [facility])     Past Surgical History  Procedure Laterality Date  . Tonsillectomy    . Endometrial ablation    . Eye surgery  2011    left cataract  . Knee arthroscopy Left 05/03/2015    Procedure: left knee arthroscopy, partial medial menisectomy, chondroplasty;  Surgeon: Dereck Leep, MD;  Location: [facility] ORS;  Service: Orthopedics;  Laterality: Left;   Family History:  Family History  Problem Relation Age of Onset  . Cancer Mother 66    ovarian   . Pernicious anemia Mother   . COPD Father     nonsmoker  . Cancer Cousin     ovarian ca   Social History:  Social History   Social History  . Marital Status: Widowed    Spouse Name: N/A  . Number of Children: N/A  . Years of Education: N/A   Social History Main Topics  . Smoking status: Never Smoker   . Smokeless tobacco: Never Used  . Alcohol Use: No  . Drug Use: No  . Sexual Activity: Not Currently   Other Topics Concern  . None   Social History Narrative   Regular exercise: not really    Caffeine use: some   Additional Social History:  She lives with her daughter.  Has 2 daughters and 2 sons. One daughter killed in car wreck. She was only married x 1. Divorced x 30 years.  Worked briefly for 5 years.    Musculoskeletal: Strength & Muscle Tone: decreased Gait & Station: unsteady Patient leans: N/A  Psychiatric Specialty Exam: HPI  ROS  Blood pressure 124/72, pulse 86, temperature 97.6 F (36.4 C), temperature source Tympanic, height 5\' 2"  (1.575 m), weight 194 lb 12.8 oz (88.361 kg), SpO2 94 %.Body mass index is 35.62 kg/(m^2).  General Appearance: Casual  Eye Contact:  Fair  Speech:  Slow  Volume:  Decreased  Mood:  Anxious and Depressed  Affect:  Flat  Thought Process:  Loose  Orientation:  Full (Time, Place, and Person)  Thought Content:  WDL  Suicidal Thoughts:  No  Homicidal Thoughts:  No  Memory:  Immediate;   Fair  Judgement:  Fair  Insight:  Fair  Psychomotor Activity:  Psychomotor Retardation  Concentration:  Fair  Recall:  [employer] of Knowledge:Fair  Language: Fair  Akathisia:  No  Handed:  Right  AIMS (if indicated):    Assets:  Communication Skills Desire for Improvement Physical Health Social Support  ADL's:  Intact  Cognition: WNL  Sleep:  5-6   Is the patient at risk to self?  No. Has the patient been a risk to self in the past 6 months?  No. Has the patient been a risk to self within the distant past?  No. Is the patient a risk to others?  No. Has the patient been a risk to others in the past 6 months?  No. Has the patient been a risk to others within the distant past?  No.  Allergies:   Allergies  Allergen Reactions  . Codeine Other (See Comments)    Crying spells  . Darvon [Propoxyphene Hcl] Other (See Comments)    Crying spells  . Propoxyphene Other (See Comments)    Crying, altered mental status   Current Medications: Current Outpatient Prescriptions  Medication Sig Dispense Refill  . ARIPiprazole (ABILIFY) 5 MG  tablet Take 1 tablet (5 mg total) by mouth at bedtime. 30 tablet 3  . aspirin 325 MG EC tablet Take 325 mg by mouth every 6 (six) hours as needed for pain.    . busPIRone (BUSPAR) 5 MG tablet Take 1 tablet (5 mg total) by mouth daily. 30 tablet 2  . Cholecalciferol (VITAMIN D-3 PO) Take 1 capsule by mouth daily. 1000iu    . escitalopram (LEXAPRO) 10 MG tablet Take 1 tablet (10 mg total) by mouth daily. 30 tablet 2  . loperamide (IMODIUM) 2 MG capsule Take 2 mg by mouth 2 (two) times daily.    Marland Kitchen omeprazole (PRILOSEC) 20 MG capsule TAKE 1 TABLET (20 MG TOTAL) BY MOUTH DAILY. 30 capsule 3  . triamcinolone cream (KENALOG) 0.1 % Apply topically 2 (two) times daily. 454 g 1  .  ciprofloxacin (CIPRO) 500 MG tablet Take 1 tablet (500 mg total) by mouth 2 (two) times daily. (Patient not taking: Reported on 12/13/2015) 14 tablet 0   No current facility-administered medications for this visit.    Previous Psychotropic Medications:  Does not remember.  No psychiatric hospitalization.    Substance Abuse History in the last 12 months:  No.  Consequences of Substance Abuse: Negative NA  Medical Decision Making:  Review of Psycho-Social Stressors (1) and Review and summation of old records (2)  Treatment Plan Summary: Medication management   Discussed patient and her daughter about the medications treatment risks benefits and alternatives.  She will continue on Lexapro 10 mg in the morning. Continue BuSpar 5 mg daily She will continue on the Abilify 5 mg in the evening.  She will follow-up in two month or earlier depending on her symptoms   More than 50% of the time spent in psychoeducation, counseling and coordination of care.  Time spent with the patient 25 mins.    This note was generated in part or whole with voice recognition software. Voice regonition is usually quite accurate but there are transcription errors that can and very often do occur. I apologize for any typographical errors  that were not detected and corrected.     Rainey Pines, MD  5/3/201711:01 AM

## 2015-12-22 DIAGNOSIS — E669 Obesity, unspecified: Secondary | ICD-10-CM | POA: Diagnosis not present

## 2015-12-22 DIAGNOSIS — G4733 Obstructive sleep apnea (adult) (pediatric): Secondary | ICD-10-CM | POA: Diagnosis not present

## 2015-12-27 ENCOUNTER — Other Ambulatory Visit: Payer: Self-pay

## 2015-12-27 MED ORDER — OMEPRAZOLE 20 MG PO CPDR: DELAYED_RELEASE_CAPSULE | ORAL | Status: DC

## 2015-12-29 DIAGNOSIS — G4733 Obstructive sleep apnea (adult) (pediatric): Secondary | ICD-10-CM | POA: Diagnosis not present

## 2016-01-30 ENCOUNTER — Encounter: Payer: Self-pay | Admitting: Podiatry

## 2016-01-30 ENCOUNTER — Ambulatory Visit (INDEPENDENT_AMBULATORY_CARE_PROVIDER_SITE_OTHER): Payer: Medicare Other | Admitting: Podiatry

## 2016-01-30 DIAGNOSIS — M79676 Pain in unspecified toe(s): Secondary | ICD-10-CM

## 2016-01-30 DIAGNOSIS — B351 Tinea unguium: Secondary | ICD-10-CM

## 2016-01-30 NOTE — Progress Notes (Signed)
Patient ID: BOWEN BONTON, female   DOB: 02/26/1932, 82 y.o.   MRN: [id number]  Subjective: 82 y.o.-year-old female returns the office today for painful, elongated, thickened toenails which she is unable to trim herself. Denies any redness or drainage around the nails. Denies any acute changes since last appointment and no new complaints today. Denies any systemic complaints such as fevers, chills, nausea, vomiting.   Objective: AAO 3, NAD DP/PT pulses palpable, CRT less than 3 seconds Nails hypertrophic, dystrophic, elongated, brittle, discolored 10. There is tenderness to nails 1-5 bilaterally. There is no surrounding erythema or drainage along the nail sites. No open lesions or pre-ulcerative lesions are identified. No other areas of tenderness bilateral lower extremities. No overlying edema, erythema, increased warmth. No pain with calf compression, swelling, warmth, erythema.  Assessment: Patient presents with symptomatic onychomycosis  Plan: -Treatment options including alternatives, risks, complications were discussed -Nails sharply debrided 10 without complication/bleeding. -Discussed daily foot inspection. If there are any changes, to call the office immediately.  -Follow-up in 3 months or sooner if any problems are to arise. In the meantime, encouraged to call the office with any questions, concerns, changes symptoms.    Celesta Gentile, DPM

## 2016-02-03 ENCOUNTER — Other Ambulatory Visit: Payer: Self-pay | Admitting: Psychiatry

## 2016-02-06 ENCOUNTER — Ambulatory Visit: Payer: Medicare Other | Admitting: Sports Medicine

## 2016-02-07 ENCOUNTER — Ambulatory Visit (INDEPENDENT_AMBULATORY_CARE_PROVIDER_SITE_OTHER): Payer: No Typology Code available for payment source | Admitting: Psychiatry

## 2016-02-07 ENCOUNTER — Encounter: Payer: Self-pay | Admitting: Psychiatry

## 2016-02-07 VITALS — BP 122/60 | HR 75 | Temp 97.0°F | Ht 62.0 in | Wt 197.0 lb

## 2016-02-07 DIAGNOSIS — F411 Generalized anxiety disorder: Secondary | ICD-10-CM

## 2016-02-07 DIAGNOSIS — F331 Major depressive disorder, recurrent, moderate: Secondary | ICD-10-CM | POA: Diagnosis not present

## 2016-02-07 MED ORDER — ARIPIPRAZOLE 5 MG PO TABS: 5.0000 mg | ORAL_TABLET | Freq: Every day | ORAL | Status: DC

## 2016-02-07 MED ORDER — ESCITALOPRAM OXALATE 10 MG PO TABS: 10.0000 mg | ORAL_TABLET | Freq: Every day | ORAL | Status: DC

## 2016-02-07 MED ORDER — BUSPIRONE HCL 5 MG PO TABS: 5.0000 mg | ORAL_TABLET | Freq: Every day | ORAL | Status: DC

## 2016-02-07 NOTE — Progress Notes (Signed)
Psychiatric MD Progress Note  Patient Identification: [name] MRN:  [id number] Date of Evaluation:  02/07/2016 Referral Source: Ronette Deter, M.D  Chief Complaint:   Chief Complaint    Follow-up; Medication Refill     Visit Diagnosis:    ICD-9-CM ICD-10-CM   1. MDD (major depressive disorder), recurrent episode, moderate (HCC) 296.32 F33.1   2. Anxiety state 300.00 F41.1    Diagnosis:   Patient Active Problem List   Diagnosis Date Noted  . Allergic rhinitis [J30.9] 12/03/2015  . UTI symptoms [R39.9] 08/30/2015  . Right flank pain [R10.9] 08/30/2015  . Hearing loss [H91.90] 06/26/2015  . Major depressive disorder with single episode ([place]) [F32.9] 04/15/2015  . OSA (obstructive sleep apnea) [G47.33] 10/27/2014  . Left knee pain [M25.562] 10/27/2014  . Obstructive apnea [G47.33] 10/27/2014  . Urge incontinence of urine [N39.41] 04/22/2014  . Incomplete uterine prolapse [N81.2] 04/22/2014  . Cystocele [N81.10] 12/29/2013  . Onychomycosis [B35.1] 08/09/2013  . Dermatophytic onychia [B35.1] 08/09/2013  . Primary hyperparathyroidism ([place]) [E21.0] 05/28/2013  . Medicare annual wellness visit, subsequent [Z00.00] 05/07/2013  . Stasis dermatitis [I83.10] 05/07/2013  . 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 Diarrhea [R19.7] 11/11/2012  . Closed fracture of humerus, supracondylar [S42.413A] 10/09/2012  . GERD (gastroesophageal reflux disease) [K21.9] 06/29/2012  . Acid reflux [K21.9] 06/29/2012  . Depression [F32.9]    History of Present Illness:    Patient is a 82 year old female who presented  for follow-up accompanied by her daughter. Her daughter reported that she has Has been stable and has a slow gait. Patient appeared calm during the interview. She also has hearing difficulties. She did not participate much in the interview. Her daughter reported that she wants to discuss about her medications in detail. Patient feels tired most of  the time. She was noted to have blunted affect. She continues to have diarrhea and abdominal pain and has been taking medications on a daily basis. She currently denied having any suicidal ideations or plans. She denied having any perceptual disturbances. Her daughter reported that she also has problems with her sleep. Patient denied having any adverse reactions to the medications at this time. However she reported to have more blunted effects related to the Abilify. We discussed about her medications and I will adjust her medications at this time.    Past Medical History:  Past Medical History  Diagnosis Date  . Depression   . GERD (gastroesophageal reflux disease)   . Anxiety   . SOB (shortness of breath) on exertion   . Sleep apnea   . Pneumonia   . Sepsis [facility])     Past Surgical History  Procedure Laterality Date  . Tonsillectomy    . Endometrial ablation    . Eye surgery  2011    left cataract  . Knee arthroscopy Left 05/03/2015    Procedure: left knee arthroscopy, partial medial menisectomy, chondroplasty;  Surgeon: Dereck Leep, MD;  Location: [facility] ORS;  Service: Orthopedics;  Laterality: Left;   Family History:  Family History  Problem Relation Age of Onset  . Cancer Mother 23    ovarian   . Pernicious anemia Mother   . COPD Father     nonsmoker  . Cancer Cousin     ovarian ca   Social History:   Social History   Social History  . Marital Status: Widowed    Spouse Name: N/A  . Number of Children: N/A  . Years of Education: N/A  Social History Main Topics  . Smoking status: Never Smoker   . Smokeless tobacco: Never Used  . Alcohol Use: No  . Drug Use: No  . Sexual Activity: Not Currently   Other Topics Concern  . None   Social History Narrative   Regular exercise: not really   Caffeine use: some   Additional Social History:  She lives with her daughter.  Has 2 daughters and 2 sons. One daughter killed in car wreck. She was only married x 1. Divorced  x 30 years.  Worked briefly for 5 years.    Musculoskeletal: Strength & Muscle Tone: decreased Gait & Station: unsteady Patient leans: N/A  Psychiatric Specialty Exam: HPI  Review of Systems  Constitutional: Positive for malaise/fatigue.  Musculoskeletal: Positive for back pain, joint pain and neck pain.  Psychiatric/Behavioral: Positive for depression and memory loss. The patient is nervous/anxious.     Blood pressure 122/60, pulse 75, temperature 97 F (36.1 C), temperature source Tympanic, height 5\' 2"  (1.575 m), weight 197 lb (89.359 kg), SpO2 93 %.Body mass index is 36.02 kg/(m^2).  General Appearance: Casual  Eye Contact:  Fair  Speech:  Slow  Volume:  Decreased  Mood:  Anxious and Depressed  Affect:  Flat  Thought Process:  Loose  Orientation:  Full (Time, Place, and Person)  Thought Content:  WDL  Suicidal Thoughts:  No  Homicidal Thoughts:  No  Memory:  Immediate;   Fair  Judgement:  Fair  Insight:  Fair  Psychomotor Activity:  Psychomotor Retardation  Concentration:  Fair  Recall:  [employer] of Knowledge:Fair  Language: Fair  Akathisia:  No  Handed:  Right  AIMS (if indicated):    Assets:  Communication Skills Desire for Improvement Physical Health Social Support  ADL's:  Intact  Cognition: WNL  Sleep:  5-6   Is the patient at risk to self?  No. Has the patient been a risk to self in the past 6 months?  No. Has the patient been a risk to self within the distant past?  No. Is the patient a risk to others?  No. Has the patient been a risk to others in the past 6 months?  No. Has the patient been a risk to others within the distant past?  No.  Allergies:   Allergies  Allergen Reactions  . Codeine Other (See Comments)    Crying spells  . Darvon [Propoxyphene Hcl] Other (See Comments)    Crying spells  . Propoxyphene Other (See Comments)    Crying, altered mental status   Current Medications: Current Outpatient Prescriptions  Medication Sig  Dispense Refill  . ARIPiprazole (ABILIFY) 10 MG tablet Take 1 tablet (10 mg total) by mouth at bedtime. 30 tablet 2  . aspirin 325 MG EC tablet Take 325 mg by mouth every 6 (six) hours as needed for pain.    . busPIRone (BUSPAR) 5 MG tablet Take 1 tablet (5 mg total) by mouth daily. 30 tablet 2  . Cholecalciferol (VITAMIN D-3 PO) Take 1 capsule by mouth daily. 1000iu    . escitalopram (LEXAPRO) 10 MG tablet Take 1 tablet (10 mg total) by mouth daily. 30 tablet 2  . loperamide (IMODIUM) 2 MG capsule Take 2 mg by mouth 2 (two) times daily.    Marland Kitchen omeprazole (PRILOSEC) 20 MG capsule TAKE 1 TABLET (20 MG TOTAL) BY MOUTH DAILY. 30 capsule 3  . triamcinolone cream (KENALOG) 0.1 % Apply topically 2 (two) times daily. 454 g 1  No current facility-administered medications for this visit.    Previous Psychotropic Medications:  Does not remember.  No psychiatric hospitalization.    Substance Abuse History in the last 12 months:  No.  Consequences of Substance Abuse: Negative NA  Medical Decision Making:  Review of Psycho-Social Stressors (1) and Review and summation of old records (2)  Treatment Plan Summary: Medication management   Discussed patient and her daughter about the medications treatment risks benefits and alternatives.  She will continue on Lexapro 10 mg in the morning. Continue BuSpar 5 mg daily I will decrease the dose of Abilify 2.5 mg in the evening to decrease the adverse effects including the parkinsonian side effects.  She will follow-up in two month or earlier depending on her symptoms   More than 50% of the time spent in psychoeducation, counseling and coordination of care.     This note was generated in part or whole with voice recognition software. Voice regonition is usually quite accurate but there are transcription errors that can and very often do occur. I apologize for any typographical errors that were not detected and corrected.     Rainey Pines, MD   6/28/201710:29 AM

## 2016-05-02 ENCOUNTER — Ambulatory Visit (INDEPENDENT_AMBULATORY_CARE_PROVIDER_SITE_OTHER): Payer: Medicare Other | Admitting: Podiatry

## 2016-05-02 ENCOUNTER — Encounter: Payer: Self-pay | Admitting: Podiatry

## 2016-05-02 DIAGNOSIS — B351 Tinea unguium: Secondary | ICD-10-CM

## 2016-05-02 DIAGNOSIS — M79676 Pain in unspecified toe(s): Secondary | ICD-10-CM | POA: Diagnosis not present

## 2016-05-02 NOTE — Progress Notes (Signed)
Patient ID: [name], female   DOB: 02-20-1932, 82 y.o.   MRN: [id number]  Subjective: 82 y.o.-year-old female returns the office today for painful, elongated, thickened toenails which she is unable to trim herself. Denies any redness or drainage around the nails. Denies any acute changes since last appointment and no new complaints today. Denies any systemic complaints such as fevers, chills, nausea, vomiting.   Objective: AAO 3, NAD DP/PT pulses palpable, CRT less than 3 seconds Nails hypertrophic, dystrophic, elongated, brittle, discolored 10. There is tenderness to nails 1-5 bilaterally. There is no surrounding erythema or drainage along the nail sites. No open lesions or pre-ulcerative lesions are identified. No other areas of tenderness bilateral lower extremities. No overlying edema, erythema, increased warmth. No pain with calf compression, swelling, warmth, erythema.  Assessment: Patient presents with symptomatic onychomycosis  Plan: -Treatment options including alternatives, risks, complications were discussed -Nails sharply debrided 10 without complication/bleeding. -Discussed daily foot inspection. If there are any changes, to call the office immediately.  -Follow-up in 3 months or sooner if any problems are to arise. In the meantime, encouraged to call the office with any questions, concerns, changes symptoms.    Celesta Gentile, DPM

## 2016-05-09 ENCOUNTER — Ambulatory Visit (INDEPENDENT_AMBULATORY_CARE_PROVIDER_SITE_OTHER): Payer: No Typology Code available for payment source | Admitting: Psychiatry

## 2016-05-09 ENCOUNTER — Encounter: Payer: Self-pay | Admitting: Psychiatry

## 2016-05-09 VITALS — BP 137/70 | HR 97 | Temp 98.6°F | Wt 197.6 lb

## 2016-05-09 DIAGNOSIS — F068 Other specified mental disorders due to known physiological condition: Secondary | ICD-10-CM | POA: Diagnosis not present

## 2016-05-09 DIAGNOSIS — F411 Generalized anxiety disorder: Secondary | ICD-10-CM

## 2016-05-09 DIAGNOSIS — F0390 Unspecified dementia without behavioral disturbance: Secondary | ICD-10-CM

## 2016-05-09 DIAGNOSIS — F331 Major depressive disorder, recurrent, moderate: Secondary | ICD-10-CM

## 2016-05-09 MED ORDER — ARIPIPRAZOLE 2 MG PO TABS: 2.0000 mg | ORAL_TABLET | Freq: Every day | ORAL | 1 refills | Status: DC

## 2016-05-09 MED ORDER — ESCITALOPRAM OXALATE 10 MG PO TABS: 10.0000 mg | ORAL_TABLET | Freq: Every day | ORAL | 2 refills | Status: DC

## 2016-05-09 MED ORDER — RIVASTIGMINE TARTRATE 1.5 MG PO CAPS: ORAL_CAPSULE | ORAL | 0 refills | Status: DC

## 2016-05-09 MED ORDER — BUSPIRONE HCL 5 MG PO TABS: 5.0000 mg | ORAL_TABLET | Freq: Every day | ORAL | 2 refills | Status: DC

## 2016-05-09 NOTE — Progress Notes (Signed)
Psychiatric MD Progress Note  Patient Identification: [name] MRN:  [id number] Date of Evaluation:  05/09/2016 Referral Source: Ronette Deter, M.D  Chief Complaint:   Chief Complaint    Follow-up; Medication Refill     Visit Diagnosis:    ICD-9-CM ICD-10-CM   1. MDD (major depressive disorder), recurrent episode, moderate (HCC) 296.32 F33.1   2. Anxiety state 300.00 F41.1   3. Dementia arising in the senium and presenium 294.8 F06.8    Diagnosis:   Patient Active Problem List   Diagnosis Date Noted  . Allergic rhinitis [J30.9] 12/03/2015  . UTI symptoms [R39.9] 08/30/2015  . Right flank pain [R10.9] 08/30/2015  . Hearing loss [H91.90] 06/26/2015  . Major depressive disorder with single episode ([place]) [F32.9] 04/15/2015  . OSA (obstructive sleep apnea) [G47.33] 10/27/2014  . Left knee pain [M25.562] 10/27/2014  . Obstructive apnea [G47.33] 10/27/2014  . Urge incontinence of urine [N39.41] 04/22/2014  . Incomplete uterine prolapse [N81.2] 04/22/2014  . Cystocele [N81.10] 12/29/2013  . Onychomycosis [B35.1] 08/09/2013  . Dermatophytic onychia [B35.1] 08/09/2013  . Primary hyperparathyroidism ([place]) [E21.0] 05/28/2013  . Medicare annual wellness visit, subsequent [Z00.00] 05/07/2013  . Stasis dermatitis [I83.10] 05/07/2013  . 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 Diarrhea [R19.7] 11/11/2012  . Closed fracture of humerus, supracondylar [S42.413A] 10/09/2012  . GERD (gastroesophageal reflux disease) [K21.9] 06/29/2012  . Acid reflux [K21.9] 06/29/2012  . Depression [F32.9]    History of Present Illness:    Patient is a 82 year old female who presented  for follow-up accompanied by her daughter. Patient reported that she has been sick for the past 2 weeks and was having diarrhea. Now she is feeling stable. She reported that she is not having any diarrhea symptoms at this time. She was feeling weak. Daughter reported that she was  trying to give her Gatorade but the patient continues to refuse. We discussed about her medications in detail. Her daughter reported that she has been feeling depressed and is not accepting any other medications. We discussed about memory problems with the patient and the daughter agreed that she should be started on medications to help with her memory issues. She has been compliant with her medications. She sits in the recliner for most of the day and has not been changing her posture.  Patient currently denied having any suicidal ideations or plans. She appeared very calm and stable and has been moving very slowly. She denied having any perceptual disturbances at this time. Her daughter is agreeable to starting her on the medication to help with her memory problems at this time.  She has responded  well to the decreased dose of Abilify at this time.  Pt  denied having any suicidal homicidal ideations or plans.   Past Medical History:  Past Medical History:  Diagnosis Date  . Anxiety   . Depression   . GERD (gastroesophageal reflux disease)   . Pneumonia   . Sepsis ([place])   . Sleep apnea   . SOB (shortness of breath) on exertion     Past Surgical History:  Procedure Laterality Date  . ENDOMETRIAL ABLATION    . EYE SURGERY  2011   left cataract  . KNEE ARTHROSCOPY Left 05/03/2015   Procedure: left knee arthroscopy, partial medial menisectomy, chondroplasty;  Surgeon: Dereck Leep, MD;  Location: [facility] ORS;  Service: Orthopedics;  Laterality: Left;  . TONSILLECTOMY     Family History:  Family History  Problem Relation Age of Onset  .  Cancer Mother 19    ovarian   . Pernicious anemia Mother   . COPD Father     nonsmoker  . Cancer Cousin     ovarian ca   Social History:   Social History   Social History  . Marital status: Widowed    Spouse name: N/A  . Number of children: N/A  . Years of education: N/A   Social History Main Topics  . Smoking status: Never Smoker  .  Smokeless tobacco: Never Used  . Alcohol use No  . Drug use: No  . Sexual activity: Not Currently   Other Topics Concern  . None   Social History Narrative   Regular exercise: not really   Caffeine use: some   Additional Social History:  She lives with her daughter.  Has 2 daughters and 2 sons. One daughter killed in car wreck. She was only married x 1. Divorced x 30 years.  Worked briefly for 5 years.    Musculoskeletal: Strength & Muscle Tone: decreased Gait & Station: unsteady Patient leans: N/A  Psychiatric Specialty Exam: Medication Refill  Associated symptoms include neck pain.    Review of Systems  Constitutional: Positive for malaise/fatigue.  Musculoskeletal: Positive for back pain, joint pain and neck pain.  Psychiatric/Behavioral: Positive for depression and memory loss. The patient is nervous/anxious.     Blood pressure 137/70, pulse 97, temperature 98.6 F (37 C), temperature source Oral, weight 197 lb 9.6 oz (89.6 kg).Body mass index is 36.14 kg/m.  General Appearance: Casual  Eye Contact:  Fair  Speech:  Slow  Volume:  Decreased  Mood:  Anxious and Depressed  Affect:  Flat  Thought Process:  Loose  Orientation:  Full (Time, Place, and Person)  Thought Content:  WDL  Suicidal Thoughts:  No  Homicidal Thoughts:  No  Memory:  Immediate;   Fair  Judgement:  Fair  Insight:  Fair  Psychomotor Activity:  Psychomotor Retardation  Concentration:  Fair  Recall:  [employer] of Knowledge:Fair  Language: Fair  Akathisia:  No  Handed:  Right  AIMS (if indicated):    Assets:  Communication Skills Desire for Improvement Physical Health Social Support  ADL's:  Intact  Cognition: WNL  Sleep:  5-6   Is the patient at risk to self?  No. Has the patient been a risk to self in the past 6 months?  No. Has the patient been a risk to self within the distant past?  No. Is the patient a risk to others?  No. Has the patient been a risk to others in the past 6  months?  No. Has the patient been a risk to others within the distant past?  No.  Allergies:   Allergies  Allergen Reactions  . Codeine Other (See Comments)    Crying spells  . Darvon [Propoxyphene Hcl] Other (See Comments)    Crying spells  . Propoxyphene Other (See Comments)    Crying, altered mental status   Current Medications: Current Outpatient Prescriptions  Medication Sig Dispense Refill  . ARIPiprazole (ABILIFY) 2 MG tablet Take 1 tablet (2 mg total) by mouth at bedtime. 30 tablet 1  . busPIRone (BUSPAR) 5 MG tablet Take 1 tablet (5 mg total) by mouth daily. 30 tablet 2  . Cholecalciferol (VITAMIN D-3 PO) Take 1 capsule by mouth daily. 1000iu    . escitalopram (LEXAPRO) 10 MG tablet Take 1 tablet (10 mg total) by mouth daily. 30 tablet 2  . loperamide (IMODIUM) 2  MG capsule Take 2 mg by mouth 2 (two) times daily.    Marland Kitchen omeprazole (PRILOSEC) 20 MG capsule TAKE 1 TABLET (20 MG TOTAL) BY MOUTH DAILY. 30 capsule 3  . triamcinolone cream (KENALOG) 0.1 % Apply topically 2 (two) times daily. 454 g 1  . aspirin 325 MG EC tablet Take 325 mg by mouth every 6 (six) hours as needed for pain.    . rivastigmine (EXELON) 1.5 MG capsule 1.5 BID x 2 weeks then 3 mg BID 120 capsule 0   No current facility-administered medications for this visit.     Previous Psychotropic Medications:  Does not remember.  No psychiatric hospitalization.    Substance Abuse History in the last 12 months:  No.  Consequences of Substance Abuse: Negative NA  Medical Decision Making:  Review of Psycho-Social Stressors (1) and Review and summation of old records (2)  Treatment Plan Summary: Medication management   Discussed patient and her daughter about the medications treatment risks benefits and alternatives.  She will continue on Lexapro 10 mg in the morning. Continue BuSpar 5 mg daily I will decrease the dose of Abilify 2 mg in the evening to decrease the adverse effects including the parkinsonian  side effects. I will  start her on Exelon 1.5 mg twice a day and then she will titrate to 3 mg by mouth twice a day. Discussed about the side effects in detail and she agreed with the plan  She will follow-up in one  month or earlier depending on her symptoms   More than 50% of the time spent in psychoeducation, counseling and coordination of care.     This note was generated in part or whole with voice recognition software. Voice regonition is usually quite accurate but there are transcription errors that can and very often do occur. I apologize for any typographical errors that were not detected and corrected.     Rainey Pines, MD  9/28/201712:24 PM

## 2016-05-16 ENCOUNTER — Telehealth: Payer: Self-pay | Admitting: *Deleted

## 2016-05-16 ENCOUNTER — Telehealth: Payer: Self-pay | Admitting: Internal Medicine

## 2016-05-16 NOTE — Telephone Encounter (Signed)
FYI :Pt and daughter has the same contact number, there was no voicemail to leave a call back message

## 2016-05-16 NOTE — Telephone Encounter (Signed)
Patient Name: [name]  DOB: [birth year]    Initial Comment Caller states mother has swelling in right foot and leg.    Nurse Assessment  Nurse: Raphael Gibney, RN, Vanita Ingles Date/Time (Eastern Time): 05/16/2016 10:18:13 AM  Confirm and document reason for call. If symptomatic, describe symptoms. You must click the next button to save text entered. ---Caller states her mother's right leg and foot are swollen. painful to the touch. toes look swollen.  Has the patient traveled out of the country within the last 30 days? ---Not Applicable  Does the patient have any new or worsening symptoms? ---Yes  Will a triage be completed? ---Yes  Related visit to physician within the last 2 weeks? ---No  Does the PT have any chronic conditions? (i.e. diabetes, asthma, etc.) ---Yes  List chronic conditions. ---anxiety  Is this a behavioral health or substance abuse call? ---No     Guidelines    Guideline Title Affirmed Question Affirmed Notes  Leg Swelling and Edema [1] Thigh, calf, or ankle swelling AND [2] only 1 side    Final Disposition User   See Physician within 4 Hours (or PCP triage) Raphael Gibney, RN, Vera    Comments  Pt will only seen female doctor at [facility]. No appts available. Does not want to go to another office or urgent care. Pt would rather have appt next week. Please call pt back regarding appt.   Referrals  GO TO FACILITY REFUSED   Disagree/Comply: Disagree  Disagree/Comply Reason: Disagree with instructions

## 2016-05-16 NOTE — Telephone Encounter (Signed)
Patient needs an appt with Joycelyn Schmid. thanks

## 2016-05-16 NOTE — Telephone Encounter (Signed)
noted 

## 2016-05-20 ENCOUNTER — Other Ambulatory Visit: Payer: Self-pay

## 2016-05-20 MED ORDER — OMEPRAZOLE 20 MG PO CPDR: DELAYED_RELEASE_CAPSULE | ORAL | 3 refills | Status: DC

## 2016-05-30 ENCOUNTER — Ambulatory Visit (INDEPENDENT_AMBULATORY_CARE_PROVIDER_SITE_OTHER): Payer: Medicare Other | Admitting: Family

## 2016-05-30 ENCOUNTER — Encounter: Payer: Self-pay | Admitting: Family

## 2016-05-30 ENCOUNTER — Ambulatory Visit
Admission: RE | Admit: 2016-05-30 | Discharge: 2016-05-30 | Disposition: A | Discharge status: Home / Self Care | Payer: Medicare Other | Source: Ambulatory Visit | Attending: Family | Admitting: Family

## 2016-05-30 ENCOUNTER — Other Ambulatory Visit: Payer: Self-pay | Admitting: Family

## 2016-05-30 ENCOUNTER — Ambulatory Visit (INDEPENDENT_AMBULATORY_CARE_PROVIDER_SITE_OTHER): Payer: Medicare Other

## 2016-05-30 VITALS — BP 126/60 | HR 86 | Temp 98.0°F | Wt 197.6 lb

## 2016-05-30 DIAGNOSIS — M7989 Other specified soft tissue disorders: Secondary | ICD-10-CM | POA: Insufficient documentation

## 2016-05-30 DIAGNOSIS — I517 Cardiomegaly: Secondary | ICD-10-CM | POA: Diagnosis not present

## 2016-05-30 DIAGNOSIS — Z23 Encounter for immunization: Secondary | ICD-10-CM | POA: Diagnosis not present

## 2016-05-30 DIAGNOSIS — M25471 Effusion, right ankle: Secondary | ICD-10-CM | POA: Insufficient documentation

## 2016-05-30 DIAGNOSIS — L989 Disorder of the skin and subcutaneous tissue, unspecified: Secondary | ICD-10-CM

## 2016-05-30 DIAGNOSIS — E213 Hyperparathyroidism, unspecified: Secondary | ICD-10-CM

## 2016-05-30 DIAGNOSIS — R21 Rash and other nonspecific skin eruption: Secondary | ICD-10-CM | POA: Insufficient documentation

## 2016-05-30 LAB — COMPREHENSIVE METABOLIC PANEL
ALBUMIN: 4.3 g/dL (ref 3.5–5.2)
ALT: 24 U/L (ref 0–35)
AST: 25 U/L (ref 0–37)
Alkaline Phosphatase: 54 U/L (ref 39–117)
BILIRUBIN TOTAL: 0.5 mg/dL (ref 0.2–1.2)
BUN: 14 mg/dL (ref 6–23)
CALCIUM: 10.8 mg/dL — AB (ref 8.4–10.5)
CO2: 32 mEq/L (ref 19–32)
CREATININE: 1.07 mg/dL (ref 0.40–1.20)
Chloride: 104 mEq/L (ref 96–112)
GFR: 52.15 mL/min — ABNORMAL LOW (ref >60.00)
Glucose, Bld: 95 mg/dL (ref 70–99)
Potassium: 3.9 mEq/L (ref 3.5–5.1)
Sodium: 141 mEq/L (ref 135–145)
TOTAL PROTEIN: 7.2 g/dL (ref 6.0–8.3)

## 2016-05-30 LAB — BRAIN NATRIURETIC PEPTIDE: PRO B NATRI PEPTIDE: 42 pg/mL (ref 0.0–100.0)

## 2016-05-30 MED ORDER — HYDROCHLOROTHIAZIDE 25 MG PO TABS
25.0000 mg | ORAL_TABLET | Freq: Every day | ORAL | 0 refills | Status: DC
Start: 2016-05-30 — End: 2017-09-15

## 2016-05-30 NOTE — Assessment & Plan Note (Signed)
Suspect seborrheic keratosis. Pending referral dermatology to ensure no malignancy and for annual skin check.

## 2016-05-30 NOTE — Progress Notes (Signed)
Pre visit review using our clinic review tool, if applicable. No additional management support is needed unless otherwise documented below in the visit note. 

## 2016-05-30 NOTE — Progress Notes (Signed)
Subjective:    Patient ID: [name], female    DOB: 02/20/1932, 82 y.o.   MRN: [id number]  CC: [name] is a 82 y.o. female who presents today for an acute visit.    HPI: Patient here for acute visit with chief compliant of chronic right leg swelling for couple of months, unchanged. Accompanied by daughter. Swelling is painful.She also notes that her right heel of foot aches for past year. No injury. No h/o DVT, cancer. Doesn't wear compression stockings. Elevates feet only 'some of time.'    Occasional SOB when walking when walking long distances. Sedentary most of time.   Denies exertional chest pain or pressure, numbness or tingling radiating to left arm or jaw, palpitations, dizziness, frequent headaches, changes in vision.   Continues to follow with Dr Gretel Acre for anxiety and depression.   Lesion has changed on right breast.      HISTORY:  Past Medical History:  Diagnosis Date  . Anxiety   . Depression   . GERD (gastroesophageal reflux disease)   . Pneumonia   . Sepsis ([place])   . Sleep apnea   . SOB (shortness of breath) on exertion    Past Surgical History:  Procedure Laterality Date  . ENDOMETRIAL ABLATION    . EYE SURGERY  2011   left cataract  . KNEE ARTHROSCOPY Left 05/03/2015   Procedure: left knee arthroscopy, partial medial menisectomy, chondroplasty;  Surgeon: Dereck Leep, MD;  Location: [facility] ORS;  Service: Orthopedics;  Laterality: Left;  . TONSILLECTOMY     Family History  Problem Relation Age of Onset  . Cancer Mother 12    ovarian   . Pernicious anemia Mother   . COPD Father     nonsmoker  . Cancer Cousin     ovarian ca    Allergies: Codeine; Darvon [propoxyphene hcl]; and Propoxyphene Current Outpatient Prescriptions on File Prior to Visit  Medication Sig Dispense Refill  . ARIPiprazole (ABILIFY) 2 MG tablet Take 1 tablet (2 mg total) by mouth at bedtime. 30 tablet 1  . aspirin 325 MG EC tablet Take 325 mg by mouth every 6 (six)  hours as needed for pain.    . busPIRone (BUSPAR) 5 MG tablet Take 1 tablet (5 mg total) by mouth daily. 30 tablet 2  . Cholecalciferol (VITAMIN D-3 PO) Take 1 capsule by mouth daily. 1000iu    . escitalopram (LEXAPRO) 10 MG tablet Take 1 tablet (10 mg total) by mouth daily. 30 tablet 2  . loperamide (IMODIUM) 2 MG capsule Take 2 mg by mouth 2 (two) times daily.    Marland Kitchen omeprazole (PRILOSEC) 20 MG capsule TAKE 1 TABLET (20 MG TOTAL) BY MOUTH DAILY. 30 capsule 3  . rivastigmine (EXELON) 1.5 MG capsule 1.5 BID x 2 weeks then 3 mg BID 120 capsule 0  . triamcinolone cream (KENALOG) 0.1 % Apply topically 2 (two) times daily. 454 g 1   No current facility-administered medications on file prior to visit.     Social History  Substance Use Topics  . Smoking status: Never Smoker  . Smokeless tobacco: Never Used  . Alcohol use No    Review of Systems  Constitutional: Negative for chills and fever.  Respiratory: Positive for shortness of breath. Negative for cough and wheezing.   Cardiovascular: Positive for leg swelling. Negative for chest pain and palpitations.  Gastrointestinal: Negative for nausea and vomiting.  Skin: Negative for color change, rash and wound.  Objective:    BP 126/60   Pulse 86   Temp 98 F (36.7 C) (Oral)   Wt 197 lb 9.6 oz (89.6 kg)   SpO2 95%   BMI 36.14 kg/m    Physical Exam  Constitutional: She appears well-developed and well-nourished.  Eyes: Conjunctivae are normal.  Cardiovascular: Normal rate, regular rhythm, normal heart sounds and normal pulses.   RLE edema around ankle + 1 , non pitting. No palpable cords or masses bilaterally. No erythema or increased warmth bilaterally. No asymmetry in calf size when compared bilaterally LE hair growth symmetric and present. Varicosities noted bilateral legs. LE warm and palpable pedal pulses.   Pulmonary/Chest: Effort normal and breath sounds normal. She has no wheezes. She has no rhonchi. She has no rales.    Neurological: She is alert.  Skin: Skin is warm and dry.     Psychiatric: She has a normal mood and affect. Her speech is normal and behavior is normal. Thought content normal.  Flat affect.  Vitals reviewed.      Assessment & Plan:   Problem List Items Addressed This Visit      Musculoskeletal and Integument   Skin lesion    Suspect seborrheic keratosis. Pending referral dermatology to ensure no malignancy and for annual skin check.      Relevant Orders   Ambulatory referral to Dermatology     Other   Leg swelling - Primary    Unilateral. Pending [place]. No crackles just fluid volume overloaded. Pending chest x-ray, BNP to evaluate for cardiomegaly, CHF. Patient has not been elevating legs or wear compression hose. I advised her that I would prefer patient to use lifestyle modifications in her medication to manage swelling. I did give her a couple doses of hydrochlorothiazide due to pain that swelling is causing. Recheck BMP in one week.      Relevant Medications   hydrochlorothiazide (HYDRODIURIL) 25 MG tablet   Other Relevant Orders   Comprehensive metabolic panel   Basic metabolic panel   US Venous Img Lower Unilateral Right   DG Chest 2 View   B Nat Peptide    Other Visit Diagnoses    Encounter for immunization       Relevant Orders   Flu vaccine HIGH DOSE PF (Completed)   Ambulatory referral to Dermatology   Comprehensive metabolic panel   Basic metabolic panel   US Venous Img Lower Unilateral Right   DG Chest 2 View   B Nat Peptide        I am having Ms. Santellan start on hydrochlorothiazide. I am also having her maintain her Cholecalciferol (VITAMIN D-3 PO), aspirin, loperamide, triamcinolone cream, ARIPiprazole, escitalopram, busPIRone, rivastigmine, and omeprazole.   Meds ordered this encounter  Medications  . hydrochlorothiazide (HYDRODIURIL) 25 MG tablet    Sig: Take 1 tablet (25 mg total) by mouth daily.    Dispense:  5 tablet    Refill:  0     Order Specific Question:   Supervising Provider    Answer:   Crecencio Mc [[id number]]    Return precautions given.   Risks, benefits, and alternatives of the medications and treatment plan prescribed today were discussed, and patient expressed understanding.   Education regarding symptom management and diagnosis given to patient on AVS.  Continue to follow with [name], FNP for routine health maintenance.   [name] and I agreed with plan.   [name], FNP

## 2016-05-30 NOTE — Assessment & Plan Note (Addendum)
Unilateral. Pending [place]. No crackles to suggest fluid volume overload.  Pending chest x-ray, BNP to evaluate for cardiomegaly, CHF. Patient has not been elevating legs or wear compression hose. I advised her that I would prefer patient to use lifestyle modifications in her medication to manage swelling. I did give her a couple doses of hydrochlorothiazide due to pain that swelling is causing. Recheck BMP in one week.

## 2016-05-30 NOTE — Patient Instructions (Signed)
I gave you 5 pills to help bring down your swelling in leg. You do NOT need all of these.  Try a dose for the next 2, maybe 3 days, and see how your swelling is.  ELEVATION Compression hose Vigilant with signs of infection- increased warmth, redness, skin breakdown                Repeat labs in one week- make appt at check out.

## 2016-06-06 ENCOUNTER — Other Ambulatory Visit (INDEPENDENT_AMBULATORY_CARE_PROVIDER_SITE_OTHER): Payer: Medicare Other

## 2016-06-06 ENCOUNTER — Other Ambulatory Visit: Payer: Self-pay | Admitting: Psychiatry

## 2016-06-06 DIAGNOSIS — E213 Hyperparathyroidism, unspecified: Secondary | ICD-10-CM

## 2016-06-06 DIAGNOSIS — M7989 Other specified soft tissue disorders: Secondary | ICD-10-CM | POA: Diagnosis not present

## 2016-06-06 DIAGNOSIS — Z23 Encounter for immunization: Secondary | ICD-10-CM

## 2016-06-06 LAB — BASIC METABOLIC PANEL
BUN: 18 mg/dL (ref 6–23)
CALCIUM: 10.7 mg/dL — AB (ref 8.4–10.5)
CO2: 27 mEq/L (ref 19–32)
Chloride: 104 mEq/L (ref 96–112)
Creatinine, Ser: 1.13 mg/dL (ref 0.40–1.20)
GFR: 48.97 mL/min — AB (ref >60.00)
GLUCOSE: 160 mg/dL — AB (ref 70–99)
POTASSIUM: 4 meq/L (ref 3.5–5.1)
SODIUM: 139 meq/L (ref 135–145)

## 2016-06-07 LAB — PTH, INTACT AND CALCIUM
Calcium: 10.5 mg/dL — ABNORMAL HIGH (ref 8.6–10.4)
PTH: 56 pg/mL (ref 14–64)

## 2016-06-09 LAB — VITAMIN D 1,25 DIHYDROXY
VITAMIN D3 1, 25 (OH): 38 pg/mL
Vitamin D 1, 25 (OH)2 Total: 38 pg/mL (ref 18–72)

## 2016-06-10 LAB — 25-HYDROXYVITAMIN D LCMS D2+D3
25-HYDROXY, VITAMIN D-3: 29 ng/mL
25-HYDROXY, VITAMIN D: 29 ng/mL — AB

## 2016-06-13 LAB — PTH-RELATED PEPTIDE: PTH-Related Protein (PTH-RP): 18 pg/mL (ref 14–27)

## 2016-07-01 DIAGNOSIS — G4733 Obstructive sleep apnea (adult) (pediatric): Secondary | ICD-10-CM | POA: Diagnosis not present

## 2016-07-02 DIAGNOSIS — L570 Actinic keratosis: Secondary | ICD-10-CM | POA: Diagnosis not present

## 2016-07-02 DIAGNOSIS — L821 Other seborrheic keratosis: Secondary | ICD-10-CM | POA: Diagnosis not present

## 2016-07-02 DIAGNOSIS — X32XXXA Exposure to sunlight, initial encounter: Secondary | ICD-10-CM | POA: Diagnosis not present

## 2016-07-09 ENCOUNTER — Telehealth: Payer: Self-pay | Admitting: *Deleted

## 2016-07-16 ENCOUNTER — Telehealth: Payer: Self-pay

## 2016-07-16 NOTE — Telephone Encounter (Signed)
pt daughter called stated that she called over a week ago and no one has returned her call.  she states that her mother medication needs to be adjusted because it is causing diarrhea.

## 2016-07-16 NOTE — Telephone Encounter (Signed)
left message that she will need to make her mother an appt to be seen.  pt was last seen i sept  and dr. Gretel Acre will not make any changes to medications without seeing patients.  pt daughter advised to call back and make an appt. to be seen

## 2016-07-17 ENCOUNTER — Other Ambulatory Visit: Payer: Self-pay | Admitting: Psychiatry

## 2016-07-17 NOTE — Telephone Encounter (Signed)
Please make appt

## 2016-07-19 ENCOUNTER — Encounter: Payer: Self-pay | Admitting: Psychiatry

## 2016-07-19 ENCOUNTER — Ambulatory Visit (INDEPENDENT_AMBULATORY_CARE_PROVIDER_SITE_OTHER): Payer: No Typology Code available for payment source | Admitting: Psychiatry

## 2016-07-19 VITALS — BP 137/73 | HR 87 | Temp 98.0°F | Wt 200.0 lb

## 2016-07-19 DIAGNOSIS — F411 Generalized anxiety disorder: Secondary | ICD-10-CM

## 2016-07-19 DIAGNOSIS — F331 Major depressive disorder, recurrent, moderate: Secondary | ICD-10-CM | POA: Diagnosis not present

## 2016-07-19 DIAGNOSIS — F0390 Unspecified dementia without behavioral disturbance: Secondary | ICD-10-CM

## 2016-07-19 MED ORDER — ESCITALOPRAM OXALATE 10 MG PO TABS: 10.0000 mg | ORAL_TABLET | Freq: Every day | ORAL | 2 refills | Status: DC

## 2016-07-19 MED ORDER — MEMANTINE HCL 5 MG PO TABS
5.0000 mg | ORAL_TABLET | Freq: Every day | ORAL | 1 refills | Status: DC
Start: 2016-07-19 — End: 2016-09-13

## 2016-07-19 MED ORDER — ARIPIPRAZOLE 2 MG PO TABS: 2.0000 mg | ORAL_TABLET | Freq: Every day | ORAL | 1 refills | Status: DC

## 2016-07-19 MED ORDER — BUSPIRONE HCL 5 MG PO TABS: 5.0000 mg | ORAL_TABLET | Freq: Every day | ORAL | 2 refills | Status: DC

## 2016-07-19 NOTE — Progress Notes (Signed)
Psychiatric MD Progress Note  Patient Identification: [name] MRN:  [id number] Date of Evaluation:  07/19/2016 Referral Source: Ronette Deter, M.D  Chief Complaint:   Chief Complaint    Follow-up; Medication Refill; Medication Problem     Visit Diagnosis:    ICD-9-CM ICD-10-CM   1. MDD (major depressive disorder), recurrent episode, moderate (HCC) 296.32 F33.1   2. Dementia arising in the senium and presenium 294.8 F03.90   3. Anxiety state 300.00 F41.1    Diagnosis:   Patient Active Problem List   Diagnosis Date Noted  . Skin lesion [L98.9] 05/30/2016  . Leg swelling [M79.89] 05/30/2016  . Allergic rhinitis [J30.9] 12/03/2015  . UTI symptoms [R39.9] 08/30/2015  . Right flank pain [R10.9] 08/30/2015  . Hearing loss [H91.90] 06/26/2015  . Major depressive disorder with single episode [F32.9] 04/15/2015  . OSA (obstructive sleep apnea) [G47.33] 10/27/2014  . Left knee pain [M25.562] 10/27/2014  . Obstructive apnea [G47.33] 10/27/2014  . Urge incontinence of urine [N39.41] 04/22/2014  . Incomplete uterine prolapse [N81.2] 04/22/2014  . Cystocele [IMO0002] 12/29/2013  . Onychomycosis [B35.1] 08/09/2013  . Dermatophytic onychia [B35.1] 08/09/2013  . Primary hyperparathyroidism ([place]) [E21.0] 05/28/2013  . Medicare annual wellness visit, subsequent [Z00.00] 05/07/2013  . Stasis dermatitis [I87.2] 05/07/2013  . 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 Diarrhea [R19.7] 11/11/2012  . Closed fracture of humerus, supracondylar [S42.413A] 10/09/2012  . GERD (gastroesophageal reflux disease) [K21.9] 06/29/2012  . Acid reflux [K21.9] 06/29/2012  . Depression [F32.9]    History of Present Illness:    Patient is a 82 year old female who presented  for follow-up accompanied by her daughter.Daughter reported that patient was having severe diarrhea due to her Exelon and she was unable to tolerate the medication. We discussed about the side  effects of the medications in detail. Her daughter reported that she is also not wearing her hearing aid on a regular basis. She wanted her to try and other medication for her dementia as patient continues to sit alone and does not interact much. She is receptive to medication changes. Her daughter was concerned about her worsening dementia. Her daughter wants her to be started on Namenda  at this time. Patient currently denied having any suicidal ideations or plans. Her daughter reported that she has been feeling depressed and is not accepting any other medications. We discussed about memory problems with the patient and the daughter agreed that she should be started on medications to help with her memory issues. She has been compliant with her medications. She sits in the recliner for most of the day and has not been changing her posture.  She appeared very calm and stable and has been moving very slowly. She denied having any perceptual disturbances at this time. Her daughter is agreeable to starting her on the medication to help with her memory problems at this time.  She has responded  well to the decreased dose of Abilify at this time.  Pt  denied having any suicidal homicidal ideations or plans.   Past Medical History:  Past Medical History:  Diagnosis Date  . Anxiety   . Depression   . GERD (gastroesophageal reflux disease)   . Pneumonia   . Sepsis ([place])   . Sleep apnea   . SOB (shortness of breath) on exertion     Past Surgical History:  Procedure Laterality Date  . ENDOMETRIAL ABLATION    . EYE SURGERY  2011   left cataract  . KNEE  ARTHROSCOPY Left 05/03/2015   Procedure: left knee arthroscopy, partial medial menisectomy, chondroplasty;  Surgeon: Dereck Leep, MD;  Location: [facility] ORS;  Service: Orthopedics;  Laterality: Left;  . TONSILLECTOMY     Family History:  Family History  Problem Relation Age of Onset  . Cancer Mother 77    ovarian   . Pernicious anemia Mother   .  COPD Father     nonsmoker  . Cancer Cousin     ovarian ca   Social History:   Social History   Social History  . Marital status: Widowed    Spouse name: N/A  . Number of children: N/A  . Years of education: N/A   Social History Main Topics  . Smoking status: Never Smoker  . Smokeless tobacco: Never Used  . Alcohol use No  . Drug use: No  . Sexual activity: Not Currently   Other Topics Concern  . None   Social History Narrative   Regular exercise: not really   Caffeine use: some   Additional Social History:  She lives with her daughter.  Has 2 daughters and 2 sons. One daughter killed in car wreck. She was only married x 1. Divorced x 30 years.  Worked briefly for 5 years.    Musculoskeletal: Strength & Muscle Tone: decreased Gait & Station: unsteady Patient leans: N/A  Psychiatric Specialty Exam: Medication Refill  Associated symptoms include neck pain.    Review of Systems  Constitutional: Positive for malaise/fatigue.  Musculoskeletal: Positive for back pain, joint pain and neck pain.  Psychiatric/Behavioral: Positive for depression and memory loss. The patient is nervous/anxious.     Blood pressure 137/73, pulse 87, temperature 98 F (36.7 C), temperature source Oral, weight 200 lb (90.7 kg).Body mass index is 36.58 kg/m.  General Appearance: Casual  Eye Contact:  Fair  Speech:  Slow  Volume:  Decreased  Mood:  Anxious and Depressed  Affect:  Flat  Thought Process:  Loose  Orientation:  Full (Time, Place, and Person)  Thought Content:  WDL  Suicidal Thoughts:  No  Homicidal Thoughts:  No  Memory:  Immediate;   Fair  Judgement:  Fair  Insight:  Fair  Psychomotor Activity:  Psychomotor Retardation  Concentration:  Fair  Recall:  [employer] of Knowledge:Fair  Language: Fair  Akathisia:  No  Handed:  Right  AIMS (if indicated):    Assets:  Communication Skills Desire for Improvement Physical Health Social Support  ADL's:  Intact  Cognition:  WNL  Sleep:  5-6   Is the patient at risk to self?  No. Has the patient been a risk to self in the past 6 months?  No. Has the patient been a risk to self within the distant past?  No. Is the patient a risk to others?  No. Has the patient been a risk to others in the past 6 months?  No. Has the patient been a risk to others within the distant past?  No.  Allergies:   Allergies  Allergen Reactions  . Codeine Other (See Comments)    Crying spells  . Darvon [Propoxyphene Hcl] Other (See Comments)    Crying spells  . Propoxyphene Other (See Comments)    Crying, altered mental status   Current Medications: Current Outpatient Prescriptions  Medication Sig Dispense Refill  . ARIPiprazole (ABILIFY) 2 MG tablet Take 1 tablet (2 mg total) by mouth at bedtime. 30 tablet 1  . aspirin 325 MG EC tablet Take 325 mg by  mouth every 6 (six) hours as needed for pain.    . busPIRone (BUSPAR) 5 MG tablet Take 1 tablet (5 mg total) by mouth daily. 30 tablet 2  . Cholecalciferol (VITAMIN D-3 PO) Take 1 capsule by mouth daily. 1000iu    . escitalopram (LEXAPRO) 10 MG tablet Take 1 tablet (10 mg total) by mouth daily. 30 tablet 2  . hydrochlorothiazide (HYDRODIURIL) 25 MG tablet Take 1 tablet (25 mg total) by mouth daily. 5 tablet 0  . loperamide (IMODIUM) 2 MG capsule Take 2 mg by mouth 2 (two) times daily.    Marland Kitchen omeprazole (PRILOSEC) 20 MG capsule TAKE 1 TABLET (20 MG TOTAL) BY MOUTH DAILY. 30 capsule 3  . triamcinolone cream (KENALOG) 0.1 % Apply topically 2 (two) times daily. 454 g 1  . memantine (NAMENDA) 5 MG tablet Take 1 tablet (5 mg total) by mouth daily. 30 tablet 1   No current facility-administered medications for this visit.     Previous Psychotropic Medications:  Does not remember.  No psychiatric hospitalization.    Substance Abuse History in the last 12 months:  No.  Consequences of Substance Abuse: Negative NA  Medical Decision Making:  Review of Psycho-Social Stressors (1) and  Review and summation of old records (2)  Treatment Plan Summary: Medication management   Discussed patient and her daughter about the medications treatment risks benefits and alternatives.  She will continue on Lexapro 10 mg in the morning. Continue BuSpar 5 mg daily I will decrease the dose of Abilify 2 mg in the evening to decrease the adverse effects including the parkinsonian side effects. I will  start her Namenda 5 mg daily.   Discussed about the side effects in detail and she agreed with the plan  She will follow-up in one  month or earlier depending on her symptoms   More than 50% of the time spent in psychoeducation, counseling and coordination of care.     This note was generated in part or whole with voice recognition software. Voice regonition is usually quite accurate but there are transcription errors that can and very often do occur. I apologize for any typographical errors that were not detected and corrected.     Rainey Pines, MD  12/8/201710:50 AM

## 2016-08-08 ENCOUNTER — Encounter: Payer: Self-pay | Admitting: Podiatry

## 2016-08-08 ENCOUNTER — Ambulatory Visit (INDEPENDENT_AMBULATORY_CARE_PROVIDER_SITE_OTHER): Payer: Medicare Other | Admitting: Podiatry

## 2016-08-08 DIAGNOSIS — M79676 Pain in unspecified toe(s): Secondary | ICD-10-CM

## 2016-08-08 DIAGNOSIS — L608 Other nail disorders: Secondary | ICD-10-CM

## 2016-08-08 DIAGNOSIS — M722 Plantar fascial fibromatosis: Secondary | ICD-10-CM | POA: Diagnosis not present

## 2016-08-08 DIAGNOSIS — M79609 Pain in unspecified limb: Secondary | ICD-10-CM

## 2016-08-08 DIAGNOSIS — M25572 Pain in left ankle and joints of left foot: Secondary | ICD-10-CM | POA: Diagnosis not present

## 2016-08-08 DIAGNOSIS — B351 Tinea unguium: Secondary | ICD-10-CM

## 2016-08-08 DIAGNOSIS — L603 Nail dystrophy: Secondary | ICD-10-CM

## 2016-08-09 ENCOUNTER — Ambulatory Visit: Payer: Medicare Other | Admitting: Podiatry

## 2016-08-10 NOTE — Progress Notes (Signed)
SUBJECTIVE Patient  presents to office today complaining of elongated, thickened nails. Pain while ambulating in shoes. Patient is unable to trim their own nails.  Patient also has a complaint today of left ankle pain as well as right heel pain. Patient states the pains been ongoing for several years intermittently. Patient has a history of arthroscopic knee surgery approximately 1 year ago to the left lower extremity. Patient presents today for further treatment and evaluation  OBJECTIVE General Patient is awake, alert, and oriented x 3 and in no acute distress. Derm Skin is dry and supple bilateral. Negative open lesions or macerations. Remaining integument unremarkable. Nails are tender, long, thickened and dystrophic with subungual debris, consistent with onychomycosis, 1-5 bilateral. No signs of infection noted. Vasc  DP and PT pedal pulses palpable bilaterally. Temperature gradient within normal limits.  Neuro Epicritic and protective threshold sensation diminished bilaterally.  Musculoskeletal Exam pain on palpation to the plantar medial aspect of the right foot at the insertion of the plantar fascia to the medial calcaneal tubercle. Pain on palpation to the anterior medial and lateral aspects of the patient's left ankle joint. No symptomatic pedal deformities noted bilateral. Muscular strength within normal limits.  ASSESSMENT 1. Onychodystrophic nails 1-5 bilateral with hyperkeratosis of nails.  2. Onychomycosis of nail due to dermatophyte bilateral 3. Pain in foot bilateral 4. Intermittent, chronic plantar fasciitis right 5. Low-grade ankle pain left secondary to degenerative joint disease  PLAN OF CARE 1. Patient evaluated today.  2. Instructed to maintain good pedal hygiene and foot care.  3. Mechanical debridement of nails 1-5 bilaterally performed using a nail nipper. Filed with dremel without incident.  4. Today we discussed the pathology of ankle pain and plantar fasciitis. We  discussed conservative modalities including anti-inflammatory injections. The patient opts for conservative modalities including good shoe gear. Patient does not want anti-inflammatory injections at the moment.  5. Return to clinic in 3 mos.    Edrick Kins, DPM [facility]  Dr. Edrick Kins, [address]                                        [place], [place] [address]                Office [phone]  Fax [phone]

## 2016-08-15 ENCOUNTER — Ambulatory Visit: Payer: No Typology Code available for payment source | Admitting: Psychiatry

## 2016-09-12 ENCOUNTER — Other Ambulatory Visit: Payer: Self-pay | Admitting: Family

## 2016-09-13 ENCOUNTER — Ambulatory Visit (INDEPENDENT_AMBULATORY_CARE_PROVIDER_SITE_OTHER): Payer: No Typology Code available for payment source | Admitting: Psychiatry

## 2016-09-13 ENCOUNTER — Encounter: Payer: Self-pay | Admitting: Psychiatry

## 2016-09-13 VITALS — BP 142/88 | HR 89 | Temp 97.4°F | Wt 211.4 lb

## 2016-09-13 DIAGNOSIS — F411 Generalized anxiety disorder: Secondary | ICD-10-CM | POA: Diagnosis not present

## 2016-09-13 DIAGNOSIS — F331 Major depressive disorder, recurrent, moderate: Secondary | ICD-10-CM

## 2016-09-13 DIAGNOSIS — F0390 Unspecified dementia without behavioral disturbance: Secondary | ICD-10-CM

## 2016-09-13 MED ORDER — ESCITALOPRAM OXALATE 10 MG PO TABS: 10.0000 mg | ORAL_TABLET | Freq: Every day | ORAL | 2 refills | Status: DC

## 2016-09-13 MED ORDER — BUSPIRONE HCL 5 MG PO TABS: 5.0000 mg | ORAL_TABLET | Freq: Two times a day (BID) | ORAL | 2 refills | Status: DC

## 2016-09-13 MED ORDER — ARIPIPRAZOLE 2 MG PO TABS: 2.0000 mg | ORAL_TABLET | Freq: Every day | ORAL | 2 refills | Status: DC

## 2016-09-13 NOTE — Progress Notes (Signed)
Psychiatric MD Progress Note  Patient Identification: [name] MRN:  [id number] Date of Evaluation:  09/13/2016 Referral Source: Ronette Deter, M.D  Chief Complaint:   Chief Complaint    Follow-up; Medication Refill     Visit Diagnosis:    ICD-9-CM ICD-10-CM   1. MDD (major depressive disorder), recurrent episode, moderate (HCC) 296.32 F33.1   2. Dementia arising in the senium and presenium 294.8 F03.90   3. Anxiety state 300.00 F41.1    Diagnosis:   Patient Active Problem List   Diagnosis Date Noted  . Skin lesion [L98.9] 05/30/2016  . Leg swelling [M79.89] 05/30/2016  . Allergic rhinitis [J30.9] 12/03/2015  . UTI symptoms [R39.9] 08/30/2015  . Right flank pain [R10.9] 08/30/2015  . Hearing loss [H91.90] 06/26/2015  . Major depressive disorder with single episode [F32.9] 04/15/2015  . OSA (obstructive sleep apnea) [G47.33] 10/27/2014  . Left knee pain [M25.562] 10/27/2014  . Obstructive apnea [G47.33] 10/27/2014  . Urge incontinence of urine [N39.41] 04/22/2014  . Incomplete uterine prolapse [N81.2] 04/22/2014  . Cystocele [IMO0002] 12/29/2013  . Onychomycosis [B35.1] 08/09/2013  . Dermatophytic onychia [B35.1] 08/09/2013  . Primary hyperparathyroidism ([place]) [E21.0] 05/28/2013  . Medicare annual wellness visit, subsequent [Z00.00] 05/07/2013  . Stasis dermatitis [I87.2] 05/07/2013  . 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 Diarrhea [R19.7] 11/11/2012  . Closed fracture of humerus, supracondylar [S42.413A] 10/09/2012  . GERD (gastroesophageal reflux disease) [K21.9] 06/29/2012  . Acid reflux [K21.9] 06/29/2012  . Depression [F32.9]    History of Present Illness:    Patient is a 83 year old female who presented  for follow-up accompanied by her daughter.Daughter reported that patient continues to be anxious and paranoid. She does not wear her hearing aid. Her Namenda was stopped as she was unable to tolerate the medication.  Patient was having anxiety symptoms as she thinks negatively most of the time. Her daughter reported that her short-term memory is bad but she does not want to start any memory medication at this time. We discussed about increasing her BuSpar and her daughter agreed with the plan. Her daughter was discussing about different issues in detail. Patient remained quiet throughout the interview and spoke only few words.  A she does not have any acute behavior problems. She is very calm and quiet throughout the interview and this is her baseline. She does not have any behavioral problems. She does not have any suicidal homicidal ideations or plans.    Past Medical History:  Past Medical History:  Diagnosis Date  . Anxiety   . Depression   . GERD (gastroesophageal reflux disease)   . Pneumonia   . Sepsis ([place])   . Sleep apnea   . SOB (shortness of breath) on exertion     Past Surgical History:  Procedure Laterality Date  . ENDOMETRIAL ABLATION    . EYE SURGERY  2011   left cataract  . KNEE ARTHROSCOPY Left 05/03/2015   Procedure: left knee arthroscopy, partial medial menisectomy, chondroplasty;  Surgeon: Dereck Leep, MD;  Location: [facility] ORS;  Service: Orthopedics;  Laterality: Left;  . TONSILLECTOMY     Family History:  Family History  Problem Relation Age of Onset  . Cancer Mother 14    ovarian   . Pernicious anemia Mother   . COPD Father     nonsmoker  . Cancer Cousin     ovarian ca   Social History:   Social History   Social History  . Marital status: Widowed  Spouse name: N/A  . Number of children: N/A  . Years of education: N/A   Social History Main Topics  . Smoking status: Never Smoker  . Smokeless tobacco: Never Used  . Alcohol use No  . Drug use: No  . Sexual activity: Not Currently   Other Topics Concern  . None   Social History Narrative   Regular exercise: not really   Caffeine use: some   Additional Social History:  She lives with her daughter.   Has 2 daughters and 2 sons. One daughter killed in car wreck. She was only married x 1. Divorced x 30 years.  Worked briefly for 5 years.    Musculoskeletal: Strength & Muscle Tone: decreased Gait & Station: unsteady Patient leans: N/A  Psychiatric Specialty Exam: Medication Refill  Associated symptoms include neck pain.    Review of Systems  Constitutional: Positive for malaise/fatigue.  Musculoskeletal: Positive for back pain, joint pain and neck pain.  Psychiatric/Behavioral: Positive for depression and memory loss. The patient is nervous/anxious.     Blood pressure (!) 142/88, pulse 89, temperature 97.4 F (36.3 C), temperature source Oral, weight 211 lb 6.4 oz (95.9 kg).Body mass index is 38.67 kg/m.  General Appearance: Casual  Eye Contact:  Fair  Speech:  Slow  Volume:  Decreased  Mood:  Anxious and Depressed  Affect:  Flat  Thought Process:  Loose  Orientation:  Full (Time, Place, and Person)  Thought Content:  WDL  Suicidal Thoughts:  No  Homicidal Thoughts:  No  Memory:  Immediate;   Fair  Judgement:  Fair  Insight:  Fair  Psychomotor Activity:  Psychomotor Retardation  Concentration:  Fair  Recall:  [employer] of Knowledge:Fair  Language: Fair  Akathisia:  No  Handed:  Right  AIMS (if indicated):    Assets:  Communication Skills Desire for Improvement Physical Health Social Support  ADL's:  Intact  Cognition: WNL  Sleep:  5-6   Is the patient at risk to self?  No. Has the patient been a risk to self in the past 6 months?  No. Has the patient been a risk to self within the distant past?  No. Is the patient a risk to others?  No. Has the patient been a risk to others in the past 6 months?  No. Has the patient been a risk to others within the distant past?  No.  Allergies:   Allergies  Allergen Reactions  . Codeine Other (See Comments)    Crying spells  . Darvon [Propoxyphene Hcl] Other (See Comments)    Crying spells  . Propoxyphene Other  (See Comments)    Crying, altered mental status   Current Medications: Current Outpatient Prescriptions  Medication Sig Dispense Refill  . ARIPiprazole (ABILIFY) 2 MG tablet Take 1 tablet (2 mg total) by mouth at bedtime. 30 tablet 2  . aspirin 325 MG EC tablet Take 325 mg by mouth every 6 (six) hours as needed for pain.    . busPIRone (BUSPAR) 5 MG tablet Take 1 tablet (5 mg total) by mouth 2 (two) times daily. 60 tablet 2  . Cholecalciferol (VITAMIN D-3 PO) Take 1 capsule by mouth daily. 1000iu    . escitalopram (LEXAPRO) 10 MG tablet Take 1 tablet (10 mg total) by mouth daily. 30 tablet 2  . hydrochlorothiazide (HYDRODIURIL) 25 MG tablet Take 1 tablet (25 mg total) by mouth daily. 5 tablet 0  . loperamide (IMODIUM) 2 MG capsule Take 2 mg by mouth 2 (  two) times daily.    Marland Kitchen omeprazole (PRILOSEC) 20 MG capsule TAKE 1 TABLET (20 MG TOTAL) BY MOUTH DAILY. 30 capsule 3  . triamcinolone cream (KENALOG) 0.1 % Apply topically 2 (two) times daily. 454 g 1   No current facility-administered medications for this visit.     Previous Psychotropic Medications:  Does not remember.  No psychiatric hospitalization.    Substance Abuse History in the last 12 months:  No.  Consequences of Substance Abuse: Negative NA  Medical Decision Making:  Review of Psycho-Social Stressors (1) and Review and summation of old records (2)  Treatment Plan Summary: Medication management   Discussed patient and her daughter about the medications treatment risks benefits and alternatives.  She will continue on Lexapro 10 mg in the morning. Continue BuSpar 5 mg Bid and why started to decrease the dose if she notices worsening of her symptoms and she demonstrated understanding. I will decrease the dose of Abilify 2 mg in the evening to decrease the adverse effects including the parkinsonian side effects. D/c  Namenda   Discussed about the side effects in detail and she agreed with the plan  She will follow-up in  3  month or earlier depending on her symptoms   More than 50% of the time spent in psychoeducation, counseling and coordination of care.     This note was generated in part or whole with voice recognition software. Voice regonition is usually quite accurate but there are transcription errors that can and very often do occur. I apologize for any typographical errors that were not detected and corrected.     Rainey Pines, MD  2/2/201811:31 AM

## 2016-09-27 ENCOUNTER — Other Ambulatory Visit: Payer: Self-pay

## 2016-09-27 MED ORDER — OMEPRAZOLE 20 MG PO CPDR: DELAYED_RELEASE_CAPSULE | ORAL | 3 refills | Status: DC

## 2016-09-27 NOTE — Telephone Encounter (Signed)
Medication has been refilled.

## 2016-11-08 ENCOUNTER — Ambulatory Visit (INDEPENDENT_AMBULATORY_CARE_PROVIDER_SITE_OTHER): Payer: Medicare Other | Admitting: Podiatry

## 2016-11-08 ENCOUNTER — Ambulatory Visit: Payer: Medicare Other | Admitting: Podiatry

## 2016-11-08 DIAGNOSIS — L603 Nail dystrophy: Secondary | ICD-10-CM

## 2016-11-08 DIAGNOSIS — B351 Tinea unguium: Secondary | ICD-10-CM

## 2016-11-08 DIAGNOSIS — L608 Other nail disorders: Secondary | ICD-10-CM

## 2016-11-08 DIAGNOSIS — M79609 Pain in unspecified limb: Secondary | ICD-10-CM | POA: Diagnosis not present

## 2016-11-08 NOTE — Progress Notes (Signed)
   SUBJECTIVE Patient  presents to office today complaining of elongated, thickened nails. Pain while ambulating in shoes. Patient is unable to trim their own nails.   OBJECTIVE General Patient is awake, alert, and oriented x 3 and in no acute distress. Derm Skin is dry and supple bilateral. Negative open lesions or macerations. Remaining integument unremarkable. Nails are tender, long, thickened and dystrophic with subungual debris, consistent with onychomycosis, 1-5 bilateral. No signs of infection noted. Vasc  DP and PT pedal pulses palpable bilaterally. Temperature gradient within normal limits.  Neuro Epicritic and protective threshold sensation diminished bilaterally.  Musculoskeletal Exam No symptomatic pedal deformities noted bilateral. Muscular strength within normal limits.  ASSESSMENT 1. Onychodystrophic nails 1-5 bilateral with hyperkeratosis of nails.  2. Onychomycosis of nail due to dermatophyte bilateral 3. Pain in foot bilateral  PLAN OF CARE 1. Patient evaluated today.  2. Instructed to maintain good pedal hygiene and foot care.  3. Mechanical debridement of nails 1-5 bilaterally performed using a nail nipper. Filed with dremel without incident.  4. Return to clinic in 3 mos.    Stellan Vick M. Malakie Balis, DPM [facility]  Dr. Annalyssa Thune M. Perl Folmar, DPM    [address]                                        [place], [place] [address]                Office [phone]  Fax [phone]      

## 2016-12-15 ENCOUNTER — Other Ambulatory Visit: Payer: Self-pay | Admitting: Psychiatry

## 2016-12-16 ENCOUNTER — Ambulatory Visit (INDEPENDENT_AMBULATORY_CARE_PROVIDER_SITE_OTHER): Payer: No Typology Code available for payment source | Admitting: Psychiatry

## 2016-12-16 ENCOUNTER — Encounter: Payer: Self-pay | Admitting: Psychiatry

## 2016-12-16 VITALS — BP 125/69 | HR 91 | Temp 97.9°F | Wt 198.6 lb

## 2016-12-16 DIAGNOSIS — F0390 Unspecified dementia without behavioral disturbance: Secondary | ICD-10-CM

## 2016-12-16 DIAGNOSIS — F331 Major depressive disorder, recurrent, moderate: Secondary | ICD-10-CM

## 2016-12-16 MED ORDER — ARIPIPRAZOLE 2 MG PO TABS: 2.0000 mg | ORAL_TABLET | Freq: Every day | ORAL | 2 refills | Status: DC

## 2016-12-16 MED ORDER — ESCITALOPRAM OXALATE 10 MG PO TABS: 10.0000 mg | ORAL_TABLET | Freq: Every day | ORAL | 2 refills | Status: DC

## 2016-12-16 MED ORDER — BUSPIRONE HCL 5 MG PO TABS: 5.0000 mg | ORAL_TABLET | Freq: Two times a day (BID) | ORAL | 2 refills | Status: DC

## 2016-12-16 NOTE — Progress Notes (Signed)
Psychiatric MD Progress Note  Patient Identification: [name] MRN:  [id number] Date of Evaluation:  12/16/2016 Referral Source: Ronette Deter, M.D  Chief Complaint:   Chief Complaint    Follow-up; Medication Refill     Visit Diagnosis:    ICD-9-CM ICD-10-CM   1. MDD (major depressive disorder), recurrent episode, moderate (HCC) 296.32 F33.1   2. Dementia arising in the senium and presenium 294.8 F03.90    Diagnosis:   Patient Active Problem List   Diagnosis Date Noted  . Skin lesion [L98.9] 05/30/2016  . Leg swelling [M79.89] 05/30/2016  . Allergic rhinitis [J30.9] 12/03/2015  . UTI symptoms [R39.9] 08/30/2015  . Right flank pain [R10.9] 08/30/2015  . Hearing loss [H91.90] 06/26/2015  . Major depressive disorder with single episode [F32.9] 04/15/2015  . OSA (obstructive sleep apnea) [G47.33] 10/27/2014  . Left knee pain [M25.562] 10/27/2014  . Obstructive apnea [G47.33] 10/27/2014  . Urge incontinence of urine [N39.41] 04/22/2014  . Incomplete uterine prolapse [N81.2] 04/22/2014  . Cystocele [IMO0002] 12/29/2013  . Onychomycosis [B35.1] 08/09/2013  . Dermatophytic onychia [B35.1] 08/09/2013  . Primary hyperparathyroidism ([place]) [E21.0] 05/28/2013  . Medicare annual wellness visit, subsequent [Z00.00] 05/07/2013  . Stasis dermatitis [I87.2] 05/07/2013  . 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 Diarrhea [R19.7] 11/11/2012  . Closed fracture of humerus, supracondylar [S42.413A] 10/09/2012  . GERD (gastroesophageal reflux disease) [K21.9] 06/29/2012  . Acid reflux [K21.9] 06/29/2012  . Depression [F32.9]    History of Present Illness:    Patient is a 83 year old female who presented  for follow-up accompanied by her daughter.Daughter reported that patient continues to Sit in the chair and she does not move. She reported that she has been providing her with the medications on a regular basis. She has already stopped taking the  Namenda. We discussed about her day-to-day activities. The daughter reported that the patient does not move. Patient continues to have memory loss but she is not taking any medications for her memory issues at this time. Patient currently denied having any suicidal ideations or plans. She also has hearing problems. She states well at night. Her daughter remains supportive.      Past Medical History:  Past Medical History:  Diagnosis Date  . Anxiety   . Depression   . GERD (gastroesophageal reflux disease)   . Pneumonia   . Sepsis ([place])   . Sleep apnea   . SOB (shortness of breath) on exertion     Past Surgical History:  Procedure Laterality Date  . ENDOMETRIAL ABLATION    . EYE SURGERY  2011   left cataract  . KNEE ARTHROSCOPY Left 05/03/2015   Procedure: left knee arthroscopy, partial medial menisectomy, chondroplasty;  Surgeon: Dereck Leep, MD;  Location: [facility] ORS;  Service: Orthopedics;  Laterality: Left;  . TONSILLECTOMY     Family History:  Family History  Problem Relation Age of Onset  . Cancer Mother 70    ovarian   . Pernicious anemia Mother   . COPD Father     nonsmoker  . Cancer Cousin     ovarian ca   Social History:   Social History   Social History  . Marital status: Widowed    Spouse name: N/A  . Number of children: N/A  . Years of education: N/A   Social History Main Topics  . Smoking status: Never Smoker  . Smokeless tobacco: Never Used  . Alcohol use No  . Drug use: No  . Sexual  activity: Not Currently   Other Topics Concern  . None   Social History Narrative   Regular exercise: not really   Caffeine use: some   Additional Social History:  She lives with her daughter.  Has 2 daughters and 2 sons. One daughter killed in car wreck. She was only married x 1. Divorced x 30 years.  Worked briefly for 5 years.    Musculoskeletal: Strength & Muscle Tone: decreased Gait & Station: unsteady Patient leans: N/A  Psychiatric Specialty  Exam: Medication Refill  Associated symptoms include neck pain.    Review of Systems  Constitutional: Positive for malaise/fatigue.  Musculoskeletal: Positive for back pain, joint pain and neck pain.  Psychiatric/Behavioral: Positive for depression and memory loss. The patient is nervous/anxious.     Blood pressure 125/69, pulse 91, temperature 97.9 F (36.6 C), temperature source Oral, weight 198 lb 9.6 oz (90.1 kg).Body mass index is 36.32 kg/m.  General Appearance: Casual  Eye Contact:  Fair  Speech:  Slow  Volume:  Decreased  Mood:  Anxious and Depressed  Affect:  Flat  Thought Process:  Loose  Orientation:  Full (Time, Place, and Person)  Thought Content:  WDL  Suicidal Thoughts:  No  Homicidal Thoughts:  No  Memory:  Immediate;   Fair  Judgement:  Fair  Insight:  Fair  Psychomotor Activity:  Psychomotor Retardation  Concentration:  Fair  Recall:  [employer] of Knowledge:Fair  Language: Fair  Akathisia:  No  Handed:  Right  AIMS (if indicated):    Assets:  Communication Skills Desire for Improvement Physical Health Social Support  ADL's:  Intact  Cognition: WNL  Sleep:  5-6   Is the patient at risk to self?  No. Has the patient been a risk to self in the past 6 months?  No. Has the patient been a risk to self within the distant past?  No. Is the patient a risk to others?  No. Has the patient been a risk to others in the past 6 months?  No. Has the patient been a risk to others within the distant past?  No.  Allergies:   Allergies  Allergen Reactions  . Codeine Other (See Comments)    Crying spells  . Darvon [Propoxyphene Hcl] Other (See Comments)    Crying spells  . Propoxyphene Other (See Comments)    Crying, altered mental status   Current Medications: Current Outpatient Prescriptions  Medication Sig Dispense Refill  . ARIPiprazole (ABILIFY) 2 MG tablet Take 1 tablet (2 mg total) by mouth at bedtime. 30 tablet 2  . aspirin 325 MG EC tablet Take  325 mg by mouth every 6 (six) hours as needed for pain.    . busPIRone (BUSPAR) 5 MG tablet Take 1 tablet (5 mg total) by mouth 2 (two) times daily. 60 tablet 2  . Cholecalciferol (VITAMIN D-3 PO) Take 1 capsule by mouth daily. 1000iu    . escitalopram (LEXAPRO) 10 MG tablet Take 1 tablet (10 mg total) by mouth daily. 30 tablet 2  . hydrochlorothiazide (HYDRODIURIL) 25 MG tablet Take 1 tablet (25 mg total) by mouth daily. 5 tablet 0  . loperamide (IMODIUM) 2 MG capsule Take 2 mg by mouth 2 (two) times daily.    Marland Kitchen omeprazole (PRILOSEC) 20 MG capsule TAKE 1 TABLET (20 MG TOTAL) BY MOUTH DAILY. 30 capsule 3  . triamcinolone cream (KENALOG) 0.1 % Apply topically 2 (two) times daily. 454 g 1   No current facility-administered medications for  this visit.     Previous Psychotropic Medications:  Does not remember.  No psychiatric hospitalization.    Substance Abuse History in the last 12 months:  No.  Consequences of Substance Abuse: Negative NA  Medical Decision Making:  Review of Psycho-Social Stressors (1) and Review and summation of old records (2)  Treatment Plan Summary: Medication management   Discussed patient and her daughter about the medications treatment risks benefits and alternatives.  She will continue on Lexapro 10 mg in the morning. Continue BuSpar 5 mg Bid  Abilify 2 mg in the evening to decrease the adverse effects including the parkinsonian side effects.   Discussed about the side effects in detail and she agreed with the plan  She will follow-up in 3  month or earlier depending on her symptoms   More than 50% of the time spent in psychoeducation, counseling and coordination of care.     This note was generated in part or whole with voice recognition software. Voice regonition is usually quite accurate but there are transcription errors that can and very often do occur. I apologize for any typographical errors that were not detected and corrected.     Rainey Pines, MD  5/7/201811:57 AM

## 2016-12-20 DIAGNOSIS — G4733 Obstructive sleep apnea (adult) (pediatric): Secondary | ICD-10-CM | POA: Diagnosis not present

## 2016-12-20 DIAGNOSIS — E669 Obesity, unspecified: Secondary | ICD-10-CM | POA: Diagnosis not present

## 2016-12-30 DIAGNOSIS — L821 Other seborrheic keratosis: Secondary | ICD-10-CM | POA: Diagnosis not present

## 2016-12-30 DIAGNOSIS — L0889 Other specified local infections of the skin and subcutaneous tissue: Secondary | ICD-10-CM | POA: Diagnosis not present

## 2016-12-30 DIAGNOSIS — L538 Other specified erythematous conditions: Secondary | ICD-10-CM | POA: Diagnosis not present

## 2016-12-30 DIAGNOSIS — B078 Other viral warts: Secondary | ICD-10-CM | POA: Diagnosis not present

## 2017-01-02 DIAGNOSIS — G4733 Obstructive sleep apnea (adult) (pediatric): Secondary | ICD-10-CM | POA: Diagnosis not present

## 2017-01-27 ENCOUNTER — Other Ambulatory Visit: Payer: Self-pay | Admitting: Family

## 2017-01-30 ENCOUNTER — Other Ambulatory Visit: Payer: Self-pay

## 2017-02-13 ENCOUNTER — Encounter: Payer: Self-pay | Admitting: Podiatry

## 2017-02-13 ENCOUNTER — Ambulatory Visit (INDEPENDENT_AMBULATORY_CARE_PROVIDER_SITE_OTHER): Payer: Medicare Other | Admitting: Podiatry

## 2017-02-13 DIAGNOSIS — B351 Tinea unguium: Secondary | ICD-10-CM | POA: Diagnosis not present

## 2017-02-13 DIAGNOSIS — M79609 Pain in unspecified limb: Secondary | ICD-10-CM | POA: Diagnosis not present

## 2017-02-13 NOTE — Progress Notes (Signed)
Complaint:  Visit Type: Patient returns to my office for continued preventative foot care services. Complaint: Patient states" my nails have grown long and thick and become painful to walk and wear shoes" . The patient presents for preventative foot care services. No changes to ROS  Podiatric Exam: Vascular: dorsalis pedis and posterior tibial pulses are palpable bilateral. Capillary return is immediate. Temperature gradient is WNL. Skin turgor WNL  Sensorium: Normal Semmes Weinstein monofilament test. Normal tactile sensation bilaterally. Nail Exam: Pt has thick disfigured discolored nails with subungual debris noted bilateral entire nail hallux through fifth toenails Ulcer Exam: There is no evidence of ulcer or pre-ulcerative changes or infection. Orthopedic Exam: Muscle tone and strength are WNL. No limitations in general ROM. No crepitus or effusions noted. Foot type and digits show no abnormalities. Bony prominences are unremarkable. Skin: No Porokeratosis. No infection or ulcers  Diagnosis:  Onychomycosis, , Pain in right toe, pain in left toes  Treatment & Plan Procedures and Treatment: Consent by patient was obtained for treatment procedures. The patient understood the discussion of treatment and procedures well. All questions were answered thoroughly reviewed. Debridement of mycotic and hypertrophic toenails, 1 through 5 bilateral and clearing of subungual debris. No ulceration, no infection noted.  Return Visit-Office Procedure: Patient instructed to return to the office for a follow up visit 3 months   for continued evaluation and treatment.    Krysten Veronica DPM 

## 2017-02-14 ENCOUNTER — Other Ambulatory Visit: Payer: Self-pay | Admitting: Family

## 2017-03-05 ENCOUNTER — Other Ambulatory Visit: Payer: Self-pay | Admitting: Family

## 2017-03-12 ENCOUNTER — Other Ambulatory Visit: Payer: Self-pay | Admitting: Psychiatry

## 2017-03-13 ENCOUNTER — Other Ambulatory Visit: Payer: Self-pay | Admitting: Psychiatry

## 2017-03-17 ENCOUNTER — Ambulatory Visit: Payer: Medicare Other | Admitting: Psychiatry

## 2017-03-24 ENCOUNTER — Ambulatory Visit (INDEPENDENT_AMBULATORY_CARE_PROVIDER_SITE_OTHER): Payer: No Typology Code available for payment source | Admitting: Psychiatry

## 2017-03-24 ENCOUNTER — Ambulatory Visit: Payer: No Typology Code available for payment source | Admitting: Psychiatry

## 2017-03-24 ENCOUNTER — Encounter: Payer: Self-pay | Admitting: Psychiatry

## 2017-03-24 VITALS — BP 118/68 | HR 91 | Temp 99.2°F | Wt 198.0 lb

## 2017-03-24 DIAGNOSIS — F0390 Unspecified dementia without behavioral disturbance: Secondary | ICD-10-CM | POA: Diagnosis not present

## 2017-03-24 DIAGNOSIS — F331 Major depressive disorder, recurrent, moderate: Secondary | ICD-10-CM

## 2017-03-24 MED ORDER — BUSPIRONE HCL 5 MG PO TABS: 5.0000 mg | ORAL_TABLET | Freq: Two times a day (BID) | ORAL | 2 refills | Status: DC

## 2017-03-24 MED ORDER — OMEPRAZOLE 20 MG PO CPDR: 20.0000 mg | DELAYED_RELEASE_CAPSULE | Freq: Every day | ORAL | 2 refills | Status: DC

## 2017-03-24 MED ORDER — ARIPIPRAZOLE 2 MG PO TABS: 2.0000 mg | ORAL_TABLET | Freq: Every day | ORAL | 2 refills | Status: DC

## 2017-03-24 MED ORDER — ESCITALOPRAM OXALATE 10 MG PO TABS: 10.0000 mg | ORAL_TABLET | Freq: Every day | ORAL | 2 refills | Status: DC

## 2017-03-24 NOTE — Progress Notes (Signed)
Psychiatric MD Progress Note  Patient Identification: ZARIANNA DICARLO MRN:  [id number] Date of Evaluation:  03/24/2017 Referral Source: Ronette Deter, M.D  Chief Complaint:    Visit Diagnosis:    ICD-10-CM   1. MDD (major depressive disorder), recurrent episode, moderate (HCC) F33.1   2. Dementia arising in the senium and presenium F03.90    Diagnosis:   Patient Active Problem List   Diagnosis Date Noted  . Skin lesion [L98.9] 05/30/2016  . Leg swelling [M79.89] 05/30/2016  . Allergic rhinitis [J30.9] 12/03/2015  . UTI symptoms [R39.9] 08/30/2015  . Right flank pain [R10.9] 08/30/2015  . Hearing loss [H91.90] 06/26/2015  . Major depressive disorder with single episode [F32.9] 04/15/2015  . OSA (obstructive sleep apnea) [G47.33] 10/27/2014  . Left knee pain [M25.562] 10/27/2014  . Obstructive apnea [G47.33] 10/27/2014  . Urge incontinence of urine [N39.41] 04/22/2014  . Incomplete uterine prolapse [N81.2] 04/22/2014  . Cystocele [IMO0002] 12/29/2013  . Onychomycosis [B35.1] 08/09/2013  . Dermatophytic onychia [B35.1] 08/09/2013  . Primary hyperparathyroidism ([place]) [E21.0] 05/28/2013  . Medicare annual wellness visit, subsequent [Z00.00] 05/07/2013  . Stasis dermatitis [I87.2] 05/07/2013  . 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 Diarrhea [R19.7] 11/11/2012  . Closed fracture of humerus, supracondylar [S42.413A] 10/09/2012  . GERD (gastroesophageal reflux disease) [K21.9] 06/29/2012  . Acid reflux [K21.9] 06/29/2012  . Depression [F32.9]    History of Present Illness:    Patient is a 83 year old female who presented  for follow-up accompanied by her daughter.Daughter reported that patient continues to Have anxiety over trivial matters. She has been having anxiety related to her brother. Patient remains pleasant and cooperative during the interview. She has been compliant with her medication. Her daughter has been helping her and stays with  her. She has been taking BuSpar and Abilify and Lexapro as prescribed. She is recently ran out of the BuSpar this morning. No side effects of the medications noted. She has been sleeping well at night. She was laughing intermittently during the interview.   Patient denied having any suicidal ideations or plans. She walks with the help of walker. She denied having any perceptual disturbances. Medication refill for the next 3 months.  Her daughter remains supportive.      Past Medical History:  Past Medical History:  Diagnosis Date  . Anxiety   . Depression   . GERD (gastroesophageal reflux disease)   . Pneumonia   . Sepsis ([place])   . Sleep apnea   . SOB (shortness of breath) on exertion     Past Surgical History:  Procedure Laterality Date  . ENDOMETRIAL ABLATION    . EYE SURGERY  2011   left cataract  . KNEE ARTHROSCOPY Left 05/03/2015   Procedure: left knee arthroscopy, partial medial menisectomy, chondroplasty;  Surgeon: Dereck Leep, MD;  Location: [facility] ORS;  Service: Orthopedics;  Laterality: Left;  . TONSILLECTOMY     Family History:  Family History  Problem Relation Age of Onset  . Cancer Mother 60       ovarian   . Pernicious anemia Mother   . COPD Father        nonsmoker  . Cancer Cousin        ovarian ca   Social History:   Social History   Social History  . Marital status: Widowed    Spouse name: N/A  . Number of children: N/A  . Years of education: N/A   Social History Main Topics  . Smoking  status: Never Smoker  . Smokeless tobacco: Never Used  . Alcohol use No  . Drug use: No  . Sexual activity: Not Currently   Other Topics Concern  . Not on file   Social History Narrative   Regular exercise: not really   Caffeine use: some   Additional Social History:  She lives with her daughter.  Has 2 daughters and 2 sons. One daughter killed in car wreck. She was only married x 1. Divorced x 30 years.  Worked briefly for 5 years.     Musculoskeletal: Strength & Muscle Tone: decreased Gait & Station: unsteady Patient leans: N/A  Psychiatric Specialty Exam: Medication Refill  Associated symptoms include neck pain.    Review of Systems  Constitutional: Positive for malaise/fatigue.  Musculoskeletal: Positive for back pain, joint pain and neck pain.  Psychiatric/Behavioral: Positive for depression and memory loss. The patient is nervous/anxious.     There were no vitals taken for this visit.There is no height or weight on file to calculate BMI.  General Appearance: Casual  Eye Contact:  Fair  Speech:  Slow  Volume:  Decreased  Mood:  Anxious and Depressed  Affect:  Flat  Thought Process:  Loose  Orientation:  Full (Time, Place, and Person)  Thought Content:  WDL  Suicidal Thoughts:  No  Homicidal Thoughts:  No  Memory:  Immediate;   Fair  Judgement:  Fair  Insight:  Fair  Psychomotor Activity:  Psychomotor Retardation  Concentration:  Fair  Recall:  [employer] of Knowledge:Fair  Language: Fair  Akathisia:  No  Handed:  Right  AIMS (if indicated):    Assets:  Communication Skills Desire for Improvement Physical Health Social Support  ADL's:  Intact  Cognition: WNL  Sleep:  5-6   Is the patient at risk to self?  No. Has the patient been a risk to self in the past 6 months?  No. Has the patient been a risk to self within the distant past?  No. Is the patient a risk to others?  No. Has the patient been a risk to others in the past 6 months?  No. Has the patient been a risk to others within the distant past?  No.  Allergies:   Allergies  Allergen Reactions  . Codeine Other (See Comments)    Crying spells  . Darvon [Propoxyphene Hcl] Other (See Comments)    Crying spells  . Propoxyphene Other (See Comments)    Crying, altered mental status   Current Medications: Current Outpatient Prescriptions  Medication Sig Dispense Refill  . ARIPiprazole (ABILIFY) 2 MG tablet Take 1 tablet (2 mg  total) by mouth at bedtime. 30 tablet 2  . aspirin 325 MG EC tablet Take 325 mg by mouth every 6 (six) hours as needed for pain.    . busPIRone (BUSPAR) 5 MG tablet Take 1 tablet (5 mg total) by mouth 2 (two) times daily. 60 tablet 2  . Cholecalciferol (VITAMIN D-3 PO) Take 1 capsule by mouth daily. 1000iu    . escitalopram (LEXAPRO) 10 MG tablet Take 1 tablet (10 mg total) by mouth daily. 30 tablet 2  . hydrochlorothiazide (HYDRODIURIL) 25 MG tablet Take 1 tablet (25 mg total) by mouth daily. 5 tablet 0  . loperamide (IMODIUM) 2 MG capsule Take 2 mg by mouth 2 (two) times daily.    Marland Kitchen omeprazole (PRILOSEC) 20 MG capsule Take 1 capsule (20 mg total) by mouth daily. 30 capsule 2  . triamcinolone cream (KENALOG) 0.1 %  Apply topically 2 (two) times daily. 454 g 1   No current facility-administered medications for this visit.     Previous Psychotropic Medications:  Does not remember.  No psychiatric hospitalization.    Substance Abuse History in the last 12 months:  No.  Consequences of Substance Abuse: Negative NA  Medical Decision Making:  Review of Psycho-Social Stressors (1) and Review and summation of old records (2)  Treatment Plan Summary: Medication management   Discussed patient and her daughter about the medications treatment risks benefits and alternatives.  She will continue on Lexapro 10 mg in the morning. Continue BuSpar 5 mg Bid  Abilify 2 mg in the evening to decrease the adverse effects including the parkinsonian side effects.   Discussed about the side effects in detail and she agreed with the plan  She will follow-up in 3  month or earlier depending on her symptoms   More than 50% of the time spent in psychoeducation, counseling and coordination of care.     This note was generated in part or whole with voice recognition software. Voice regonition is usually quite accurate but there are transcription errors that can and very often do occur. I apologize for any  typographical errors that were not detected and corrected.     Rainey Pines, MD  8/13/20181:08 PM

## 2017-03-24 NOTE — Progress Notes (Signed)
Psychiatric MD Progress Note  Patient Identification: CREE NAPOLI MRN:  [id number] Date of Evaluation:  03/24/2017 Referral Source: Ronette Deter, M.D  Chief Complaint:   Chief Complaint    Follow-up; Medication Refill     Visit Diagnosis:    ICD-10-CM   1. MDD (major depressive disorder), recurrent episode, moderate (HCC) F33.1   2. Dementia arising in the senium and presenium F03.90    Diagnosis:   Patient Active Problem List   Diagnosis Date Noted  . Skin lesion [L98.9] 05/30/2016  . Leg swelling [M79.89] 05/30/2016  . Allergic rhinitis [J30.9] 12/03/2015  . UTI symptoms [R39.9] 08/30/2015  . Right flank pain [R10.9] 08/30/2015  . Hearing loss [H91.90] 06/26/2015  . Major depressive disorder with single episode [F32.9] 04/15/2015  . OSA (obstructive sleep apnea) [G47.33] 10/27/2014  . Left knee pain [M25.562] 10/27/2014  . Obstructive apnea [G47.33] 10/27/2014  . Urge incontinence of urine [N39.41] 04/22/2014  . Incomplete uterine prolapse [N81.2] 04/22/2014  . Cystocele [IMO0002] 12/29/2013  . Onychomycosis [B35.1] 08/09/2013  . Dermatophytic onychia [B35.1] 08/09/2013  . Primary hyperparathyroidism ([place]) [E21.0] 05/28/2013  . Medicare annual wellness visit, subsequent [Z00.00] 05/07/2013  . Stasis dermatitis [I87.2] 05/07/2013  . 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 Diarrhea [R19.7] 11/11/2012  . Closed fracture of humerus, supracondylar [S42.413A] 10/09/2012  . GERD (gastroesophageal reflux disease) [K21.9] 06/29/2012  . Acid reflux [K21.9] 06/29/2012  . Depression [F32.9]    History of Present Illness:    Patient is a 83 year old female who presented  for follow-up accompanied by her daughter.Daughter reported that patient continues to Sit in the chair and she does not move. She reported that she has been providing her with the medications on a regular basis. She has already stopped taking the Namenda. We discussed about  her day-to-day activities. The daughter reported that the patient does not move. Patient continues to have memory loss but she is not taking any medications for her memory issues at this time. Patient currently denied having any suicidal ideations or plans. She also has hearing problems. She states well at night. Her daughter remains supportive.      Past Medical History:  Past Medical History:  Diagnosis Date  . Anxiety   . Depression   . GERD (gastroesophageal reflux disease)   . Pneumonia   . Sepsis ([place])   . Sleep apnea   . SOB (shortness of breath) on exertion     Past Surgical History:  Procedure Laterality Date  . ENDOMETRIAL ABLATION    . EYE SURGERY  2011   left cataract  . KNEE ARTHROSCOPY Left 05/03/2015   Procedure: left knee arthroscopy, partial medial menisectomy, chondroplasty;  Surgeon: Dereck Leep, MD;  Location: [facility] ORS;  Service: Orthopedics;  Laterality: Left;  . TONSILLECTOMY     Family History:  Family History  Problem Relation Age of Onset  . Cancer Mother 46       ovarian   . Pernicious anemia Mother   . COPD Father        nonsmoker  . Cancer Cousin        ovarian ca   Social History:   Social History   Social History  . Marital status: Widowed    Spouse name: N/A  . Number of children: N/A  . Years of education: N/A   Social History Main Topics  . Smoking status: Never Smoker  . Smokeless tobacco: Never Used  . Alcohol use No  .  Drug use: No  . Sexual activity: Not Currently   Other Topics Concern  . None   Social History Narrative   Regular exercise: not really   Caffeine use: some   Additional Social History:  She lives with her daughter.  Has 2 daughters and 2 sons. One daughter killed in car wreck. She was only married x 1. Divorced x 30 years.  Worked briefly for 5 years.    Musculoskeletal: Strength & Muscle Tone: decreased Gait & Station: unsteady Patient leans: N/A  Psychiatric Specialty Exam: Medication Refill   Associated symptoms include neck pain.    Review of Systems  Constitutional: Positive for malaise/fatigue.  Musculoskeletal: Positive for back pain, joint pain and neck pain.  Psychiatric/Behavioral: Positive for depression and memory loss. The patient is nervous/anxious.     Blood pressure 118/68, pulse 91, temperature 99.2 F (37.3 C), temperature source Oral, weight 198 lb (89.8 kg).Body mass index is 36.21 kg/m.  General Appearance: Casual  Eye Contact:  Fair  Speech:  Slow  Volume:  Decreased  Mood:  Anxious and Depressed  Affect:  Flat  Thought Process:  Loose  Orientation:  Full (Time, Place, and Person)  Thought Content:  WDL  Suicidal Thoughts:  No  Homicidal Thoughts:  No  Memory:  Immediate;   Fair  Judgement:  Fair  Insight:  Fair  Psychomotor Activity:  Psychomotor Retardation  Concentration:  Fair  Recall:  [employer] of Knowledge:Fair  Language: Fair  Akathisia:  No  Handed:  Right  AIMS (if indicated):    Assets:  Communication Skills Desire for Improvement Physical Health Social Support  ADL's:  Intact  Cognition: WNL  Sleep:  5-6   Is the patient at risk to self?  No. Has the patient been a risk to self in the past 6 months?  No. Has the patient been a risk to self within the distant past?  No. Is the patient a risk to others?  No. Has the patient been a risk to others in the past 6 months?  No. Has the patient been a risk to others within the distant past?  No.  Allergies:   Allergies  Allergen Reactions  . Codeine Other (See Comments)    Crying spells  . Darvon [Propoxyphene Hcl] Other (See Comments)    Crying spells  . Propoxyphene Other (See Comments)    Crying, altered mental status   Current Medications: Current Outpatient Prescriptions  Medication Sig Dispense Refill  . ARIPiprazole (ABILIFY) 2 MG tablet Take 1 tablet (2 mg total) by mouth at bedtime. 30 tablet 2  . aspirin 325 MG EC tablet Take 325 mg by mouth every 6 (six)  hours as needed for pain.    . busPIRone (BUSPAR) 5 MG tablet Take 1 tablet (5 mg total) by mouth 2 (two) times daily. 60 tablet 2  . Cholecalciferol (VITAMIN D-3 PO) Take 1 capsule by mouth daily. 1000iu    . escitalopram (LEXAPRO) 10 MG tablet Take 1 tablet (10 mg total) by mouth daily. 30 tablet 2  . hydrochlorothiazide (HYDRODIURIL) 25 MG tablet Take 1 tablet (25 mg total) by mouth daily. 5 tablet 0  . loperamide (IMODIUM) 2 MG capsule Take 2 mg by mouth 2 (two) times daily.    Marland Kitchen omeprazole (PRILOSEC) 20 MG capsule TAKE 1 CAPSULE(20 MG) BY MOUTH DAILY 30 capsule 0  . triamcinolone cream (KENALOG) 0.1 % Apply topically 2 (two) times daily. 454 g 1   No current facility-administered  medications for this visit.     Previous Psychotropic Medications:  Does not remember.  No psychiatric hospitalization.    Substance Abuse History in the last 12 months:  No.  Consequences of Substance Abuse: Negative NA  Medical Decision Making:  Review of Psycho-Social Stressors (1) and Review and summation of old records (2)  Treatment Plan Summary: Medication management   Discussed patient and her daughter about the medications treatment risks benefits and alternatives.  She will continue on Lexapro 10 mg in the morning. Continue BuSpar 5 mg Bid  Abilify 2 mg in the evening to decrease the adverse effects including the parkinsonian side effects.   Discussed about the side effects in detail and she agreed with the plan  She will follow-up in 3  month or earlier depending on her symptoms   More than 50% of the time spent in psychoeducation, counseling and coordination of care.     This note was generated in part or whole with voice recognition software. Voice regonition is usually quite accurate but there are transcription errors that can and very often do occur. I apologize for any typographical errors that were not detected and corrected.     Rainey Pines, MD  8/13/201811:55 AM

## 2017-04-08 ENCOUNTER — Ambulatory Visit: Payer: No Typology Code available for payment source | Admitting: Psychiatry

## 2017-05-15 ENCOUNTER — Ambulatory Visit (INDEPENDENT_AMBULATORY_CARE_PROVIDER_SITE_OTHER): Payer: Medicare Other | Admitting: Podiatry

## 2017-05-15 ENCOUNTER — Encounter: Payer: Self-pay | Admitting: Podiatry

## 2017-05-15 DIAGNOSIS — B351 Tinea unguium: Secondary | ICD-10-CM | POA: Diagnosis not present

## 2017-05-15 DIAGNOSIS — M79609 Pain in unspecified limb: Secondary | ICD-10-CM | POA: Diagnosis not present

## 2017-05-15 NOTE — Progress Notes (Signed)
Complaint:  Visit Type: Patient returns to my office for continued preventative foot care services. Complaint: Patient states" my nails have grown long and thick and become painful to walk and wear shoes" . The patient presents for preventative foot care services. No changes to ROS  Podiatric Exam: Vascular: dorsalis pedis and posterior tibial pulses are palpable bilateral. Capillary return is immediate. Temperature gradient is WNL. Skin turgor WNL  Sensorium: Normal Semmes Weinstein monofilament test. Normal tactile sensation bilaterally. Nail Exam: Pt has thick disfigured discolored nails with subungual debris noted bilateral entire nail hallux through fifth toenails Ulcer Exam: There is no evidence of ulcer or pre-ulcerative changes or infection. Orthopedic Exam: Muscle tone and strength are WNL. No limitations in general ROM. No crepitus or effusions noted. Foot type and digits show no abnormalities. Bony prominences are unremarkable. Skin: No Porokeratosis. No infection or ulcers  Diagnosis:  Onychomycosis, , Pain in right toe, pain in left toes  Treatment & Plan Procedures and Treatment: Consent by patient was obtained for treatment procedures. The patient understood the discussion of treatment and procedures well. All questions were answered thoroughly reviewed. Debridement of mycotic and hypertrophic toenails, 1 through 5 bilateral and clearing of subungual debris. No ulceration, no infection noted.  Return Visit-Office Procedure: Patient instructed to return to the office for a follow up visit 3 months   for continued evaluation and treatment.    Kamil Mchaffie DPM 

## 2017-05-27 ENCOUNTER — Other Ambulatory Visit: Payer: Self-pay | Admitting: Psychiatry

## 2017-06-16 ENCOUNTER — Ambulatory Visit (INDEPENDENT_AMBULATORY_CARE_PROVIDER_SITE_OTHER): Payer: No Typology Code available for payment source | Admitting: Psychiatry

## 2017-06-16 ENCOUNTER — Other Ambulatory Visit: Payer: Self-pay

## 2017-06-16 ENCOUNTER — Encounter: Payer: Self-pay | Admitting: Psychiatry

## 2017-06-16 VITALS — BP 132/77 | HR 84 | Temp 98.2°F | Wt 195.4 lb

## 2017-06-16 DIAGNOSIS — F331 Major depressive disorder, recurrent, moderate: Secondary | ICD-10-CM | POA: Diagnosis not present

## 2017-06-16 DIAGNOSIS — F0390 Unspecified dementia without behavioral disturbance: Secondary | ICD-10-CM

## 2017-06-16 MED ORDER — ARIPIPRAZOLE 2 MG PO TABS: 2.0000 mg | ORAL_TABLET | Freq: Every day | ORAL | 2 refills | Status: DC

## 2017-06-16 MED ORDER — ESCITALOPRAM OXALATE 10 MG PO TABS: 10.0000 mg | ORAL_TABLET | Freq: Every day | ORAL | 2 refills | Status: DC

## 2017-06-16 MED ORDER — BUSPIRONE HCL 5 MG PO TABS: 5.0000 mg | ORAL_TABLET | Freq: Two times a day (BID) | ORAL | 2 refills | Status: DC

## 2017-06-16 NOTE — Progress Notes (Signed)
Psychiatric MD Progress Note  Patient Identification: [name] MRN:  [id number] Date of Evaluation:  06/16/2017 Referral Source: Ronette Deter, M.D  Chief Complaint:   Chief Complaint    Follow-up; Medication Refill     Visit Diagnosis:    ICD-10-CM   1. MDD (major depressive disorder), recurrent episode, moderate (HCC) F33.1   2. Dementia arising in the senium and presenium F03.90    Diagnosis:   Patient Active Problem List   Diagnosis Date Noted  . Skin lesion [L98.9] 05/30/2016  . Leg swelling [M79.89] 05/30/2016  . Allergic rhinitis [J30.9] 12/03/2015  . UTI symptoms [R39.9] 08/30/2015  . Right flank pain [R10.9] 08/30/2015  . Hearing loss [H91.90] 06/26/2015  . Major depressive disorder with single episode [F32.9] 04/15/2015  . OSA (obstructive sleep apnea) [G47.33] 10/27/2014  . Left knee pain [M25.562] 10/27/2014  . Obstructive apnea [G47.33] 10/27/2014  . Urge incontinence of urine [N39.41] 04/22/2014  . Incomplete uterine prolapse [N81.2] 04/22/2014  . Cystocele [IMO0002] 12/29/2013  . Onychomycosis [B35.1] 08/09/2013  . Dermatophytic onychia [B35.1] 08/09/2013  . Primary hyperparathyroidism ([place]) [E21.0] 05/28/2013  . Medicare annual wellness visit, subsequent [Z00.00] 05/07/2013  . Stasis dermatitis [I87.2] 05/07/2013  . 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 Diarrhea [R19.7] 11/11/2012  . Closed fracture of humerus, supracondylar [S42.413A] 10/09/2012  . GERD (gastroesophageal reflux disease) [K21.9] 06/29/2012  . Acid reflux [K21.9] 06/29/2012  . Depression [F32.9]    History of Present Illness:    Patient is a 83 year old female who presented  for follow-up accompanied by her daughter.Daughter reported that patient has started improving on the current combination of medications. She reported that patient is anxious as her brother about sick and she was worried about him however he has to call back to let her know  that he is doing well. Patient has been compliant with her medications. She sleeps well at night. Patient appeared to be more alert during the interview. She was able to answer questions above she currently denied having any suicidal ideations or plans. She attends [employer] meetings on Sunday mornings to remain active in the community. Her daughter is supportive of her behavior. She does not take any medications for her memory problems at this time as she has history of adverse reactions to the same. She denied having any perceptual disturbances. She denied having any suicidal homicidal ideations or plans.        Past Medical History:  Past Medical History:  Diagnosis Date  . Anxiety   . Depression   . GERD (gastroesophageal reflux disease)   . Pneumonia   . Sepsis ([place])   . Sleep apnea   . SOB (shortness of breath) on exertion     Past Surgical History:  Procedure Laterality Date  . ENDOMETRIAL ABLATION    . EYE SURGERY  2011   left cataract  . TONSILLECTOMY     Family History:  Family History  Problem Relation Age of Onset  . Cancer Mother 23       ovarian   . Pernicious anemia Mother   . COPD Father        nonsmoker  . Cancer Cousin        ovarian ca   Social History:   Social History   Socioeconomic History  . Marital status: Widowed    Spouse name: None  . Number of children: 5  . Years of education: None  . Highest education level: None  Social Needs  .  Financial resource strain: Somewhat hard  . Food insecurity - worry: Sometimes true  . Food insecurity - inability: Sometimes true  . Transportation needs - medical: No  . Transportation needs - non-medical: No  Occupational History  . Occupation: retired  Tobacco Use  . Smoking status: Never Smoker  . Smokeless tobacco: Never Used  Substance and Sexual Activity  . Alcohol use: No  . Drug use: No  . Sexual activity: Not Currently  Other Topics Concern  . None  Social History Narrative    Regular exercise: not really   Caffeine use: some   Additional Social History:  She lives with her daughter.  Has 2 daughters and 2 sons. One daughter killed in car wreck. She was only married x 1. Divorced x 30 years.  Worked briefly for 5 years.    Musculoskeletal: Strength & Muscle Tone: decreased Gait & Station: unsteady Patient leans: N/A  Psychiatric Specialty Exam: Medication Refill     Review of Systems  Constitutional: Positive for malaise/fatigue.  Musculoskeletal: Positive for joint pain.  Psychiatric/Behavioral: Positive for depression and memory loss. The patient is nervous/anxious.     Blood pressure 132/77, pulse 84, temperature 98.2 F (36.8 C), temperature source Oral, weight 195 lb 6.4 oz (88.6 kg).Body mass index is 35.74 kg/m.  General Appearance: Casual  Eye Contact:  Fair  Speech:  Slow  Volume:  Decreased  Mood:  Anxious and Depressed  Affect:  Flat  Thought Process:  Loose  Orientation:  Full (Time, Place, and Person)  Thought Content:  WDL  Suicidal Thoughts:  No  Homicidal Thoughts:  No  Memory:  Immediate;   Fair  Judgement:  Fair  Insight:  Fair  Psychomotor Activity:  Psychomotor Retardation  Concentration:  Fair  Recall:  [employer] of Knowledge:Fair  Language: Fair  Akathisia:  No  Handed:  Right  AIMS (if indicated):    Assets:  Communication Skills Desire for Improvement Physical Health Social Support  ADL's:  Intact  Cognition: WNL  Sleep:  5-6   Is the patient at risk to self?  No. Has the patient been a risk to self in the past 6 months?  No. Has the patient been a risk to self within the distant past?  No. Is the patient a risk to others?  No. Has the patient been a risk to others in the past 6 months?  No. Has the patient been a risk to others within the distant past?  No.  Allergies:   Allergies  Allergen Reactions  . Codeine Other (See Comments)    Crying spells  . Darvon [Propoxyphene Hcl] Other (See  Comments)    Crying spells  . Propoxyphene Other (See Comments)    Crying, altered mental status   Current Medications: Current Outpatient Medications  Medication Sig Dispense Refill  . ARIPiprazole (ABILIFY) 2 MG tablet Take 1 tablet (2 mg total) at bedtime by mouth. 30 tablet 2  . aspirin 325 MG EC tablet Take 325 mg by mouth every 6 (six) hours as needed for pain.    . busPIRone (BUSPAR) 5 MG tablet Take 1 tablet (5 mg total) 2 (two) times daily by mouth. 60 tablet 2  . Cholecalciferol (VITAMIN D-3 PO) Take 1 capsule by mouth daily. 1000iu    . escitalopram (LEXAPRO) 10 MG tablet Take 1 tablet (10 mg total) daily by mouth. 30 tablet 2  . hydrochlorothiazide (HYDRODIURIL) 25 MG tablet Take 1 tablet (25 mg total) by mouth daily.  5 tablet 0  . loperamide (IMODIUM) 2 MG capsule Take 2 mg by mouth 2 (two) times daily.    Marland Kitchen omeprazole (PRILOSEC) 20 MG capsule Take 1 capsule (20 mg total) by mouth daily. 30 capsule 2  . triamcinolone cream (KENALOG) 0.1 % Apply topically 2 (two) times daily. 454 g 1   No current facility-administered medications for this visit.     Previous Psychotropic Medications:  Does not remember.  No psychiatric hospitalization.    Substance Abuse History in the last 12 months:  No.  Consequences of Substance Abuse: Negative NA  Medical Decision Making:  Review of Psycho-Social Stressors (1) and Review and summation of old records (2)  Treatment Plan Summary: Medication management   Discussed patient and her daughter about the medications treatment risks benefits and alternatives.  Patient will continue on the medications as follows  She will continue on Lexapro 10 mg in the morning. Continue BuSpar 5 mg Bid  Abilify 2 mg in the evening    Discussed about the side effects in detail and she agreed with the plan  She will follow-up in 3  month or earlier depending on her symptoms   More than 50% of the time spent in psychoeducation, counseling and  coordination of care.     This note was generated in part or whole with voice recognition software. Voice regonition is usually quite accurate but there are transcription errors that can and very often do occur. I apologize for any typographical errors that were not detected and corrected.     Rainey Pines, MD  11/5/201811:25 AM

## 2017-06-29 ENCOUNTER — Other Ambulatory Visit: Payer: Self-pay | Admitting: Psychiatry

## 2017-08-06 ENCOUNTER — Other Ambulatory Visit: Payer: Self-pay | Admitting: Family

## 2017-08-18 ENCOUNTER — Encounter: Payer: Self-pay | Admitting: Podiatry

## 2017-08-18 ENCOUNTER — Ambulatory Visit: Payer: Medicare Other | Admitting: Podiatry

## 2017-08-18 DIAGNOSIS — M79609 Pain in unspecified limb: Secondary | ICD-10-CM

## 2017-08-18 DIAGNOSIS — B351 Tinea unguium: Secondary | ICD-10-CM | POA: Diagnosis not present

## 2017-08-18 NOTE — Progress Notes (Signed)
Complaint:  Visit Type: Patient returns to my office for continued preventative foot care services. Complaint: Patient states" my nails have grown long and thick and become painful to walk and wear shoes"  The patient presents for preventative foot care services. No changes to ROS.  Patient requests that her big toenail right foot not be touched.  Podiatric Exam: Vascular: dorsalis pedis and posterior tibial pulses are palpable bilateral. Capillary return is immediate. Temperature gradient is WNL. Skin turgor WNL  Sensorium: Normal Semmes Weinstein monofilament test. Normal tactile sensation bilaterally. Nail Exam: Pt has thick disfigured discolored nails with subungual debris noted bilateral entire nail hallux through fifth toenails  Ulcer Exam: There is no evidence of ulcer or pre-ulcerative changes or infection. Orthopedic Exam: Muscle tone and strength are WNL. No limitations in general ROM. No crepitus or effusions noted. Foot type and digits show no abnormalities. Bony prominences are unremarkable. Skin: No Porokeratosis. No infection or ulcers  Diagnosis:  Onychomycosis, , Pain in right toe, pain in left toes  Treatment & Plan Procedures and Treatment: Consent by patient was obtained for treatment procedures. The patient understood the discussion of treatment and procedures well. All questions were answered thoroughly reviewed. Debridement of mycotic and hypertrophic toenails, 1 through 5 bilateral and clearing of subungual debris except right great toenail.. No ulceration, no infection noted.  Return Visit-Office Procedure: Patient instructed to return to the office for a follow up visit 3 months for continued evaluation and treatment.    Gardiner Barefoot DPM

## 2017-08-21 ENCOUNTER — Ambulatory Visit: Payer: Medicare Other | Admitting: Internal Medicine

## 2017-08-21 DIAGNOSIS — R739 Hyperglycemia, unspecified: Secondary | ICD-10-CM | POA: Diagnosis not present

## 2017-08-21 DIAGNOSIS — K219 Gastro-esophageal reflux disease without esophagitis: Secondary | ICD-10-CM

## 2017-08-21 DIAGNOSIS — M7989 Other specified soft tissue disorders: Secondary | ICD-10-CM | POA: Diagnosis not present

## 2017-08-21 DIAGNOSIS — Z0000 Encounter for general adult medical examination without abnormal findings: Secondary | ICD-10-CM

## 2017-08-21 LAB — CBC WITH DIFFERENTIAL/PLATELET
BASOS PCT: 0.7 % (ref 0.0–3.0)
Basophils Absolute: 0 10*3/uL (ref 0.0–0.1)
EOS PCT: 2.7 % (ref 0.0–5.0)
Eosinophils Absolute: 0.2 10*3/uL (ref 0.0–0.7)
HCT: 45.3 % (ref 36.0–46.0)
Hemoglobin: 15 g/dL (ref 12.0–15.0)
LYMPHS ABS: 2 10*3/uL (ref 0.7–4.0)
Lymphocytes Relative: 33.2 % (ref 12.0–46.0)
MCHC: 33.1 g/dL (ref 30.0–36.0)
MCV: 89.3 fl (ref 78.0–100.0)
MONOS PCT: 7.7 % (ref 3.0–12.0)
Monocytes Absolute: 0.5 10*3/uL (ref 0.1–1.0)
NEUTROS ABS: 3.3 10*3/uL (ref 1.4–7.7)
Neutrophils Relative %: 55.7 % (ref 43.0–77.0)
PLATELETS: 269 10*3/uL (ref 150.0–400.0)
RBC: 5.07 Mil/uL (ref 3.87–5.11)
RDW: 14.4 % (ref 11.5–15.5)
WBC: 5.9 10*3/uL (ref 4.0–10.5)

## 2017-08-21 LAB — COMPREHENSIVE METABOLIC PANEL
ALBUMIN: 4.3 g/dL (ref 3.5–5.2)
ALT: 22 U/L (ref 0–35)
AST: 23 U/L (ref 0–37)
Alkaline Phosphatase: 60 U/L (ref 39–117)
BUN: 14 mg/dL (ref 6–23)
CALCIUM: 11 mg/dL — AB (ref 8.4–10.5)
CHLORIDE: 101 meq/L (ref 96–112)
CO2: 30 meq/L (ref 19–32)
Creatinine, Ser: 1.11 mg/dL (ref 0.40–1.20)
GFR: 49.84 mL/min — ABNORMAL LOW (ref >60.00)
Glucose, Bld: 80 mg/dL (ref 70–99)
POTASSIUM: 4.1 meq/L (ref 3.5–5.1)
SODIUM: 139 meq/L (ref 135–145)
Total Bilirubin: 0.5 mg/dL (ref 0.2–1.2)
Total Protein: 7.5 g/dL (ref 6.0–8.3)

## 2017-08-21 LAB — URINALYSIS, ROUTINE W REFLEX MICROSCOPIC
BILIRUBIN URINE: NEGATIVE
Ketones, ur: NEGATIVE
Nitrite: NEGATIVE
Specific Gravity, Urine: >=1.03 — AB (ref 1.000–1.030)
UROBILINOGEN UA: 0.2 (ref 0.0–1.0)
Urine Glucose: NEGATIVE
pH: 5.5 (ref 5.0–8.0)

## 2017-08-21 LAB — T4, FREE: Free T4: 0.68 ng/dL (ref 0.60–1.60)

## 2017-08-21 LAB — TSH: TSH: 3.73 u[IU]/mL (ref 0.35–4.50)

## 2017-08-21 LAB — HEMOGLOBIN A1C: Hgb A1c MFr Bld: 6 % (ref 4.6–6.5)

## 2017-08-21 MED ORDER — OMEPRAZOLE 20 MG PO CPDR: 20.0000 mg | DELAYED_RELEASE_CAPSULE | Freq: Every day | ORAL | 5 refills | Status: DC

## 2017-08-21 NOTE — Patient Instructions (Addendum)
Follow up in 3-4 weeks sooner if needed  Try compression stockings knee highs over the counter  Take care  Consider Tdap vaccine at your pharmacy   DTaP Vaccine (Diphtheria, Tetanus, and Pertussis): What You Need to Know 1. Why get vaccinated? Diphtheria, tetanus, and pertussis are serious diseases caused by bacteria. Diphtheria and pertussis are spread from person to person. Tetanus enters the body through cuts or wounds. DIPHTHERIA causes a thick covering in the back of the throat.  It can lead to breathing problems, paralysis, heart failure, and even death.  TETANUS (Lockjaw) causes painful tightening of the muscles, usually all over the body.  It can lead to "locking" of the jaw so the victim cannot open his mouth or swallow. Tetanus leads to death in up to 2 out of 10 cases.  PERTUSSIS (Whooping Cough) causes coughing spells so bad that it is hard for infants to eat, drink, or breathe. These spells can last for weeks.  It can lead to pneumonia, seizures (jerking and staring spells), brain damage, and death.  Diphtheria, tetanus, and pertussis vaccine (DTaP) can help prevent these diseases. Most children who are vaccinated with DTaP will be protected throughout childhood. Many more children would get these diseases if we stopped vaccinating. DTaP is a safer version of an older vaccine called DTP. DTP is no longer used in the [place]. 2. Who should get DTaP vaccine and when? Children should get 5 doses of DTaP vaccine, one dose at each of the following ages:  2 months  4 months  6 months  15-18 months  4-6 years  DTaP may be given at the same time as other vaccines. 3. Some children should not get DTaP vaccine or should wait  Children with minor illnesses, such as a cold, may be vaccinated. But children who are moderately or severely ill should usually wait until they recover before getting DTaP vaccine.  Any child who had a life-threatening allergic reaction after a  dose of DTaP should not get another dose.  Any child who suffered a brain or nervous system disease within 7 days after a dose of DTaP should not get another dose.  Talk with your doctor if your child: ? had a seizure or collapsed after a dose of DTaP, ? cried non-stop for 3 hours or more after a dose of DTaP, ? had a fever over 105F after a dose of DTaP. Ask your doctor for more information. Some of these children should not get another dose of pertussis vaccine, but may get a vaccine without pertussis, called DT. 4. Older children and adults DTaP is not licensed for adolescents, adults, or children 27 years of age and older. But older people still need protection. A vaccine called Tdap is similar to DTaP. A single dose of Tdap is recommended for people 11 through 82 years of age. Another vaccine, called Td, protects against tetanus and diphtheria, but not pertussis. It is recommended every 10 years. There are separate Vaccine Information Statements for these vaccines. 5. What are the risks from DTaP vaccine? Getting diphtheria, tetanus, or pertussis disease is much riskier than getting DTaP vaccine. However, a vaccine, like any medicine, is capable of causing serious problems, such as severe allergic reactions. The risk of DTaP vaccine causing serious harm, or death, is extremely small. Mild problems (common)  Fever (up to about 1 child in 4)  Redness or swelling where the shot was given (up to about 1 child in 4)  Soreness or tenderness  where the shot was given (up to about 1 child in 4) These problems occur more often after the 4th and 5th doses of the DTaP series than after earlier doses. Sometimes the 4th or 5th dose of DTaP vaccine is followed by swelling of the entire arm or leg in which the shot was given, lasting 1-7 days (up to about 1 child in 57). Other mild problems include:  Fussiness (up to about 1 child in 3)  Tiredness or poor appetite (up to about 1 child in  10)  Vomiting (up to about 1 child in 31) These problems generally occur 1-3 days after the shot. Moderate problems (uncommon)  Seizure (jerking or staring) (about 1 child out of 14,000)  Non-stop crying, for 3 hours or more (up to about 1 child out of 1,000)  High fever, over 105F (about 1 child out of 16,000) Severe problems (very rare)  Serious allergic reaction (less than 1 out of a million doses)  Several other severe problems have been reported after DTaP vaccine. These include: ? Long-term seizures, coma, or lowered consciousness ? Permanent brain damage. These are so rare it is hard to tell if they are caused by the vaccine. Controlling fever is especially important for children who have had seizures, for any reason. It is also important if another family member has had seizures. You can reduce fever and pain by giving your child an aspirin-free pain reliever when the shot is given, and for the next 24 hours, following the package instructions. 6. What if there is a serious reaction? What should I look for? Look for anything that concerns you, such as signs of a severe allergic reaction, very high fever, or behavior changes. Signs of a severe allergic reaction can include hives, swelling of the face and throat, difficulty breathing, a fast heartbeat, dizziness, and weakness. These would start a few minutes to a few hours after the vaccination. What should I do?  If you think it is a severe allergic reaction or other emergency that can't wait, call 9-1-1 or get the person to the nearest hospital. Otherwise, call your doctor.  Afterward, the reaction should be reported to the Vaccine Adverse Event Reporting System (VAERS). Your doctor might file this report, or you can do it yourself through the VAERS web site at www.vaers.SamedayNews.es, or by calling 343-180-3906. ? VAERS is only for reporting reactions. They do not give medical advice. 7. The National Vaccine Injury Compensation  Program The [employer] Vaccine Injury Compensation Program (VICP) is a federal program that was created to compensate people who may have been injured by certain vaccines. Persons who believe they may have been injured by a vaccine can learn about the program and about filing a claim by calling [phone] or visiting the [place] website at GoldCloset.com.ee. 8. How can I learn more?  Ask your doctor.  Call your local or state health department.  Contact the Centers for Disease Control and Prevention (CDC): ? Call 236-849-8153 (1-800-CDC-INFO) or ? Visit CDC's website at http://hunter.com/ CDC DTaP Vaccine (Diphtheria, Tetanus, and Pertussis) VIS (12/26/05) This information is not intended to replace advice given to you by your health care provider. Make sure you discuss any questions you have with your health care provider. Document Released: 05/26/2006 Document Revised: 04/18/2016 Document Reviewed: 04/18/2016 Elsevier Interactive Patient Education  2017 Elsevier Inc.  Hypercalcemia Hypercalcemia is having too much calcium in the blood. The body needs calcium to make bones and keep them strong. Calcium also helps the muscles,  nerves, brain, and heart work the way they should. Most of the calcium in the body is in the bones. There is also some calcium in the blood. Hypercalcemia can happen when calcium comes out of the bones, or when the kidneys are not able to remove calcium from the blood. Hypercalcemia can be mild or severe. What are the causes? There are many possible causes of hypercalcemia. Common causes include:  Hyperparathyroidism. This is a condition in which the body produces too much parathyroid hormone. There are four parathyroid glands in your neck. These glands produce a chemical messenger (hormone) that helps the body absorb calcium from foods and helps your bones release calcium.  Certain kinds of cancer, such as lung cancer, breast cancer, or  myeloma.  Less common causes of hypercalcemia include:  Getting too much calcium or vitamin D from your diet.  Kidney failure.  Hyperthyroidism.  Being on bed rest for a long time.  Certain medicines.  Infections.  Sarcoidosis.  What increases the risk? This condition is more likely to develop in:  Women.  People who are 60 years or older.  People who have a family history of hypercalcemia.  What are the signs or symptoms? Mild hypercalcemia that starts slowly may not cause symptoms. Severe, sudden hypercalcemia is more likely to cause symptoms, such as:  Loss of appetite.  Increased thirst and frequent urination.  Fatigue.  Nausea and vomiting.  Headache.  Abdominal pain.  Muscle pain, twitching, or weakness.  Constipation.  Blood in the urine.  Pain in the side of the back (flank pain).  Anxiety, confusion, or depression.  Irregular heartbeat (arrhythmia).  Loss of consciousness.  How is this diagnosed? This condition may be diagnosed based on:  Your symptoms.  Blood tests.  Urine tests.  X-rays.  Ultrasound.  MRI.  CT scan.  How is this treated? Treatment for hypercalcemia depends on the cause. Treatment may include:  Receiving fluids through an IV tube.  Medicines that keep calcium levels steady after receiving fluids (loop diuretics).  Medicines that keep calcium in your bones (bisphosphonates).  Medicines that lower the calcium level in your blood.  Surgery to remove overactive parathyroid glands.  Follow these instructions at home:  Take over-the-counter and prescription medicines only as told by your health care provider.  Follow instructions from your health care provider about eating or drinking restrictions.  Drink enough fluid to keep your urine clear or pale yellow.  Stay active. Weight-bearing exercise helps to keep calcium in your bones. Follow instructions from your health care provider about what type and  level of exercise is safe for you.  Keep all follow-up visits as told by your health care provider. This is important. Contact a health care provider if:  You have a fever.  You have flank or abdominal pain that is getting worse. Get help right away if:  You have severe abdominal or flank pain.  You have chest pain.  You have trouble breathing.  You become very confused and sleepy.  You lose consciousness. This information is not intended to replace advice given to you by your health care provider. Make sure you discuss any questions you have with your health care provider. Document Released: 10/12/2004 Document Revised: 01/04/2016 Document Reviewed: 12/14/2014 Elsevier Interactive Patient Education  2018 [place] for Gastroesophageal Reflux Disease, Adult When you have gastroesophageal reflux disease (GERD), the foods you eat and your eating habits are very important. Choosing the right foods can help ease  your discomfort. What guidelines do I need to follow?  Choose fruits, vegetables, whole grains, and low-fat dairy products.  Choose low-fat meat, fish, and poultry.  Limit fats such as oils, salad dressings, butter, nuts, and avocado.  Keep a food diary. This helps you identify foods that cause symptoms.  Avoid foods that cause symptoms. These may be different for everyone.  Eat small meals often instead of 3 large meals a day.  Eat your meals slowly, in a place where you are relaxed.  Limit fried foods.  Cook foods using methods other than frying.  Avoid drinking alcohol.  Avoid drinking large amounts of liquids with your meals.  Avoid bending over or lying down until 2-3 hours after eating. What foods are not recommended? These are some foods and drinks that may make your symptoms worse: Vegetables Tomatoes. Tomato juice. Tomato and spaghetti sauce. Chili peppers. Onion and garlic. Horseradish. Fruits Oranges, grapefruit, and lemon  (fruit and juice). Meats High-fat meats, fish, and poultry. This includes hot dogs, ribs, ham, sausage, salami, and bacon. Dairy Whole milk and chocolate milk. Sour cream. Cream. Butter. Ice cream. Cream cheese. Drinks Coffee and tea. Bubbly (carbonated) drinks or energy drinks. Condiments Hot sauce. Barbecue sauce. Sweets/Desserts Chocolate and cocoa. Donuts. Peppermint and spearmint. Fats and Oils High-fat foods. This includes [place] fries and potato chips. Other Vinegar. Strong spices. This includes black pepper, white pepper, red pepper, cayenne, curry powder, cloves, ginger, and chili powder. The items listed above may not be a complete list of foods and drinks to avoid. Contact your dietitian for more information. This information is not intended to replace advice given to you by your health care provider. Make sure you discuss any questions you have with your health care provider. Document Released: 01/28/2012 Document Revised: 01/04/2016 Document Reviewed: 06/02/2013 Elsevier Interactive Patient Education  2017 [employer].

## 2017-08-22 ENCOUNTER — Telehealth: Payer: Self-pay

## 2017-08-22 LAB — VITAMIN D 25 HYDROXY (VIT D DEFICIENCY, FRACTURES): VITD: 32.4 ng/mL (ref 30.00–100.00)

## 2017-08-22 NOTE — Telephone Encounter (Signed)
pt daughter called states that her mom insurance states that dr. Gretel Acre is not in network any longer and needs to know what to do.

## 2017-08-22 NOTE — Telephone Encounter (Signed)
left message for pt to call the insurance and find out who is in network and phone number to billing was also left.

## 2017-08-24 ENCOUNTER — Encounter: Payer: Self-pay | Admitting: Internal Medicine

## 2017-08-24 NOTE — Progress Notes (Signed)
Chief Complaint  Patient presents with  . Follow-up   Follow up with daughter. Pt seems to have short term memory issues so daughter provides history  1. Daughter c/o chronic right ankle swelling x 2 years and pt does not like for her daughter to put on Rx compression stockings  2. H/o hypercalcemia will w/u per daughter pt has parathyroid issues in the past but never had surgery  3. GERD would like Rx refill of prilosec  4. H/o HTN off HCTZ per daughter BP today controlled    Review of Systems  Constitutional: Negative for weight loss.  HENT: Positive for hearing loss.   Respiratory: Negative for cough.   Cardiovascular: Positive for leg swelling. Negative for chest pain.       +chronic right leg swelling   Gastrointestinal: Negative for abdominal pain.  Genitourinary:       No urinary sx's   Musculoskeletal: Negative for falls.  Skin: Negative for rash.  Neurological: Negative for headaches.  Psychiatric/Behavioral: Positive for memory loss.   Past Medical History:  Diagnosis Date  . Anxiety   . Depression   . GERD (gastroesophageal reflux disease)   . Hypercalcemia   . Pneumonia   . Sepsis ([place])   . Sleep apnea   . SOB (shortness of breath) on exertion    Past Surgical History:  Procedure Laterality Date  . ENDOMETRIAL ABLATION    . EYE SURGERY  2011   left cataract  . KNEE ARTHROSCOPY Left 05/03/2015   Procedure: left knee arthroscopy, partial medial menisectomy, chondroplasty;  Surgeon: Dereck Leep, MD;  Location: [facility] ORS;  Service: Orthopedics;  Laterality: Left;  . TONSILLECTOMY     Family History  Problem Relation Age of Onset  . Cancer Mother 27       ovarian   . Pernicious anemia Mother   . COPD Father        nonsmoker  . Cancer Cousin        ovarian ca   Social History   Socioeconomic History  . Marital status: Widowed    Spouse name: Not on file  . Number of children: 5  . Years of education: Not on file  . Highest education level: Not on  file  Social Needs  . Financial resource strain: Somewhat hard  . Food insecurity - worry: Sometimes true  . Food insecurity - inability: Sometimes true  . Transportation needs - medical: No  . Transportation needs - non-medical: No  Occupational History  . Occupation: retired  Tobacco Use  . Smoking status: Never Smoker  . Smokeless tobacco: Never Used  Substance and Sexual Activity  . Alcohol use: No  . Drug use: No  . Sexual activity: Not Currently  Other Topics Concern  . Not on file  Social History Narrative   Regular exercise: not really   Caffeine use: some   Current Meds  Medication Sig  . ARIPiprazole (ABILIFY) 2 MG tablet Take 1 tablet (2 mg total) at bedtime by mouth.  Marland Kitchen aspirin 325 MG EC tablet Take 325 mg by mouth every 6 (six) hours as needed for pain.  Marland Kitchen buPROPion (WELLBUTRIN) 100 MG tablet Take by mouth daily.   . Cholecalciferol (VITAMIN D-3 PO) Take 1 capsule by mouth daily. 1000iu  . escitalopram (LEXAPRO) 10 MG tablet Take 1 tablet (10 mg total) daily by mouth.  . loperamide (IMODIUM) 2 MG capsule Take 2 mg by mouth 2 (two) times daily.  Marland Kitchen omeprazole (PRILOSEC) 20 MG  capsule Take 1 capsule (20 mg total) by mouth daily.  Marland Kitchen triamcinolone cream (KENALOG) 0.1 % Apply topically 2 (two) times daily.  . vitamin B-12 (CYANOCOBALAMIN) 1000 MCG tablet Take by mouth.  . [DISCONTINUED] omeprazole (PRILOSEC) 20 MG capsule Take 1 capsule (20 mg total) by mouth daily.   Allergies  Allergen Reactions  . Codeine Other (See Comments)    Crying spells  . Darvon [Propoxyphene Hcl] Other (See Comments)    Crying spells  . Propoxyphene Other (See Comments)    Crying, altered mental status   Recent Results (from the past 2160 hour(s))  Comprehensive metabolic panel     Status: Abnormal   Collection Time: 08/21/17  3:18 PM  Result Value Ref Range   Sodium 139 135 - 145 mEq/L   Potassium 4.1 3.5 - 5.1 mEq/L   Chloride 101 96 - 112 mEq/L   CO2 30 19 - 32 mEq/L   Glucose,  Bld 80 70 - 99 mg/dL   BUN 14 6 - 23 mg/dL   Creatinine, Ser 1.11 0.40 - 1.20 mg/dL   Total Bilirubin 0.5 0.2 - 1.2 mg/dL   Alkaline Phosphatase 60 39 - 117 U/L   AST 23 0 - 37 U/L   ALT 22 0 - 35 U/L   Total Protein 7.5 6.0 - 8.3 g/dL   Albumin 4.3 3.5 - 5.2 g/dL   Calcium 11.0 (H) 8.4 - 10.5 mg/dL   GFR 49.84 (L) >60.00 mL/min  CBC w/Diff     Status: None   Collection Time: 08/21/17  3:18 PM  Result Value Ref Range   WBC 5.9 4.0 - 10.5 K/uL   RBC 5.07 3.87 - 5.11 Mil/uL   Hemoglobin 15.0 12.0 - 15.0 g/dL   HCT 45.3 36.0 - 46.0 %   MCV 89.3 78.0 - 100.0 fl   MCHC 33.1 30.0 - 36.0 g/dL   RDW 14.4 11.5 - 15.5 %   Platelets 269.0 150.0 - 400.0 K/uL   Neutrophils Relative % 55.7 43.0 - 77.0 %   Lymphocytes Relative 33.2 12.0 - 46.0 %   Monocytes Relative 7.7 3.0 - 12.0 %   Eosinophils Relative 2.7 0.0 - 5.0 %   Basophils Relative 0.7 0.0 - 3.0 %   Neutro Abs 3.3 1.4 - 7.7 K/uL   Lymphs Abs 2.0 0.7 - 4.0 K/uL   Monocytes Absolute 0.5 0.1 - 1.0 K/uL   Eosinophils Absolute 0.2 0.0 - 0.7 K/uL   Basophils Absolute 0.0 0.0 - 0.1 K/uL  Hemoglobin A1c     Status: None   Collection Time: 08/21/17  3:18 PM  Result Value Ref Range   Hgb A1c MFr Bld 6.0 4.6 - 6.5 %    Comment: Glycemic Control Guidelines for People with Diabetes:Non Diabetic:  <6%Goal of Therapy: <7%Additional Action Suggested:  >8%   TSH     Status: None   Collection Time: 08/21/17  3:18 PM  Result Value Ref Range   TSH 3.73 0.35 - 4.50 uIU/mL  Urinalysis, Routine w reflex microscopic     Status: Abnormal   Collection Time: 08/21/17  3:18 PM  Result Value Ref Range   Color, Urine YELLOW Yellow;Lt. Yellow   APPearance Sl Cloudy (A) Clear   Specific Gravity, Urine >=1.030 (A) 1.000 - 1.030   pH 5.5 5.0 - 8.0   Total Protein, Urine TRACE (A) Negative   Urine Glucose NEGATIVE Negative   Ketones, ur NEGATIVE Negative   Bilirubin Urine NEGATIVE Negative   Hgb urine dipstick  SMALL (A) Negative   Urobilinogen, UA 0.2  0.0 - 1.0   Leukocytes, UA MODERATE (A) Negative   Nitrite NEGATIVE Negative   WBC, UA 21-50/hpf (A) 0-2/hpf   RBC / HPF 7-10/hpf (A) 0-2/hpf   Mucus, UA Presence of (A) None   Squamous Epithelial / LPF Few(5-10/hpf) (A) Rare(0-4/hpf)   Bacteria, UA Rare(<10/hpf) (A) None  PTH, intact (no Ca)     Status: None   Collection Time: 08/21/17  3:18 PM  Result Value Ref Range   PTH 47 14 - 64 pg/mL    Comment: . Interpretive Guide    Intact PTH           Calcium ------------------    ----------           ------- Normal Parathyroid    Normal               Normal Hypoparathyroidism    Low or Low Normal    Low Hyperparathyroidism    Primary            Normal or High       High    Secondary          High                 Normal or Low    Tertiary           High                 High Non-Parathyroid    Hypercalcemia      Low or Low Normal    High .   Calcium, ionized     Status: Abnormal   Collection Time: 08/21/17  3:18 PM  Result Value Ref Range   Calcium, Ion 6.3 (H) 4.8 - 5.6 mg/dL  T4, free     Status: None   Collection Time: 08/21/17  3:18 PM  Result Value Ref Range   Free T4 0.68 0.60 - 1.60 ng/dL    Comment: Specimens from patients who are undergoing biotin therapy and /or ingesting biotin supplements may contain high levels of biotin.  The higher biotin concentration in these specimens interferes with this Free T4 assay.  Specimens that contain high levels  of biotin may cause false high results for this Free T4 assay.  Please interpret results in light of the total clinical presentation of the patient.    TEST AUTHORIZATION     Status: None   Collection Time: 08/21/17  3:18 PM  Result Value Ref Range   TEST NAME: VITAMIN D, 1,25 DIHYDROXY    TEST CODE: [id number]    CLIENT CONTACT: LATOYA W    REPORT ALWAYS MESSAGE SIGNATURE      Comment: . The laboratory testing on this patient was verbally requested or confirmed by the ordering physician or his or her  authorized representative after contact with an employee of [employer]. Federal regulations require that we maintain on file written authorization for all laboratory testing.  Accordingly we are asking that the ordering physician or his or her authorized representative sign a copy of this report and promptly return it to the client service representative. . . Signature:____________________________________________________ . Please fax this signed page to [phone] or return it via your [employer] courier.   Vitamin D (25 hydroxy)     Status: None   Collection Time: 08/21/17  3:29 PM  Result Value Ref Range   VITD 32.40 30.00 - 100.00 ng/mL   Objective  Body mass index is 37.56 kg/m. Wt Readings from Last 3 Encounters:  08/21/17 205 lb 6 oz (93.2 kg)  05/30/16 197 lb 9.6 oz (89.6 kg)  11/23/15 194 lb 6.4 oz (88.2 kg)   Temp Readings from Last 3 Encounters:  08/21/17 98.3 F (36.8 C) (Oral)  05/30/16 98 F (36.7 C) (Oral)  11/23/15 99 F (37.2 C) (Oral)   BP Readings from Last 3 Encounters:  08/21/17 110/74  05/30/16 126/60  11/23/15 114/60   Pulse Readings from Last 3 Encounters:  08/21/17 66  05/30/16 86  11/23/15 81   O2 sat room air 96%  Physical Exam  Constitutional: She is well-developed, well-nourished, and in no distress. Vital signs are normal.  Oriented to person, daughter and location   HENT:  Head: Normocephalic and atraumatic.  Mouth/Throat: Oropharynx is clear and moist and mucous membranes are normal.  Eyes: Conjunctivae are normal. Pupils are equal, round, and reactive to light.  Cardiovascular: Normal rate, regular rhythm and normal heart sounds.  1 to 2+ leg edema right lower leg no calf pain   Pulmonary/Chest: Effort normal and breath sounds normal.  Abdominal: There is no tenderness.  Neurological: She is alert.  Alert and oriented to person, location and daughter   Skin: Skin is warm, dry and intact.  Psychiatric: Mood,  affect and judgment normal. She exhibits abnormal recent memory.  Nursing note and vitals reviewed.   Assessment   1. H/o HTN controlled per daughter pt is not taking HCTZ 25 mg  2. Chronic right lower leg swelling  3. Hypercalcemia  4. GERD 5. HM  Plan  1. Off hctz  Monitor bp normal today  2. Try OTC compression stockings since pt does not like Rx ones  Try leg elevation higher than heart  3. W/u hypercalcemia  Ionized ca, PTH, PTHrp, 25 vit D, 1, 25 vit D  CMET 4. Refill med  5.  Had flu shot, prevnar, pna 23,  Will need tdap and shingrix if has not had  Last colonoscopy and mammo 12/25/11  Check A1C, CBC, UA, TSH, freeT4 today hold on lipid not fasting.   Cont f/u with psych  Provider: Dr. Olivia Mackie McLean-Scocuzza-[facility]

## 2017-08-28 LAB — TEST AUTHORIZATION

## 2017-08-28 LAB — VITAMIN D 1,25 DIHYDROXY
Vitamin D 1, 25 (OH)2 Total: 40 pg/mL (ref 18–72)
Vitamin D3 1, 25 (OH)2: 40 pg/mL

## 2017-08-28 LAB — PARATHYROID HORMONE, INTACT (NO CA): PTH: 47 pg/mL (ref 14–64)

## 2017-08-28 LAB — CALCIUM, IONIZED: Calcium, Ion: 6.3 mg/dL — ABNORMAL HIGH (ref 4.8–5.6)

## 2017-08-28 LAB — PTH-RELATED PEPTIDE: PTH-RELATED PROTEIN (PTH-RP): 13 pg/mL — AB (ref 14–27)

## 2017-08-29 ENCOUNTER — Other Ambulatory Visit: Payer: Self-pay | Admitting: Internal Medicine

## 2017-09-15 ENCOUNTER — Ambulatory Visit (INDEPENDENT_AMBULATORY_CARE_PROVIDER_SITE_OTHER): Payer: No Typology Code available for payment source | Admitting: Psychiatry

## 2017-09-15 ENCOUNTER — Encounter: Payer: Self-pay | Admitting: Psychiatry

## 2017-09-15 ENCOUNTER — Other Ambulatory Visit: Payer: Self-pay

## 2017-09-15 VITALS — BP 138/71 | HR 90 | Temp 98.4°F | Wt 195.8 lb

## 2017-09-15 DIAGNOSIS — F411 Generalized anxiety disorder: Secondary | ICD-10-CM | POA: Diagnosis not present

## 2017-09-15 DIAGNOSIS — F331 Major depressive disorder, recurrent, moderate: Secondary | ICD-10-CM

## 2017-09-15 MED ORDER — BUPROPION HCL 75 MG PO TABS
75.0000 mg | ORAL_TABLET | Freq: Every morning | ORAL | 2 refills | Status: DC
Start: 2017-09-15 — End: 2017-10-27

## 2017-09-15 MED ORDER — ESCITALOPRAM OXALATE 10 MG PO TABS: 10.0000 mg | ORAL_TABLET | Freq: Every day | ORAL | 2 refills | Status: DC

## 2017-09-15 MED ORDER — BUSPIRONE HCL 5 MG PO TABS: 5.0000 mg | ORAL_TABLET | Freq: Two times a day (BID) | ORAL | 2 refills | Status: DC

## 2017-09-15 MED ORDER — ARIPIPRAZOLE 2 MG PO TABS: 2.0000 mg | ORAL_TABLET | Freq: Every day | ORAL | 2 refills | Status: DC

## 2017-09-15 NOTE — Progress Notes (Signed)
Psychiatric MD Progress Note  Patient Identification: [name] MRN:  [id number] Date of Evaluation:  09/15/2017 Referral Source: Ronette Deter, M.D  Chief Complaint:   Chief Complaint    Follow-up; Medication Refill     Visit Diagnosis:    ICD-10-CM   1. MDD (major depressive disorder), recurrent episode, moderate (HCC) F33.1   2. Anxiety state F41.1    Diagnosis:   Patient Active Problem List   Diagnosis Date Noted  . Hypercalcemia [E83.52] 08/24/2017  . Skin lesion [L98.9] 05/30/2016  . Leg swelling [M79.89] 05/30/2016  . Allergic rhinitis [J30.9] 12/03/2015  . UTI symptoms [R39.9] 08/30/2015  . Right flank pain [R10.9] 08/30/2015  . Hearing loss [H91.90] 06/26/2015  . Major depressive disorder with single episode [F32.9] 04/15/2015  . OSA (obstructive sleep apnea) [G47.33] 10/27/2014  . Left knee pain [M25.562] 10/27/2014  . Obstructive apnea [G47.33] 10/27/2014  . Urge incontinence of urine [N39.41] 04/22/2014  . Incomplete uterine prolapse [N81.2] 04/22/2014  . Cystocele [IMO0002] 12/29/2013  . Onychomycosis [B35.1] 08/09/2013  . Dermatophytic onychia [B35.1] 08/09/2013  . Primary hyperparathyroidism ([place]) [E21.0] 05/28/2013  . Medicare annual wellness visit, subsequent [Z00.00] 05/07/2013  . Stasis dermatitis [I87.2] 05/07/2013  . 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 Diarrhea [R19.7] 11/11/2012  . Closed fracture of humerus, supracondylar [S42.413A] 10/09/2012  . GERD (gastroesophageal reflux disease) [K21.9] 06/29/2012  . Acid reflux [K21.9] 06/29/2012  . Depression [F32.9]    History of Present Illness:    Patient is a 84 year old female who presented  for follow-up accompanied by her daughter.Daughter reported that patient has started aborting that she is more dizzy and is not participating in any activities. She has been sitting on the couch for most of the time. We discussed about her medications. It was noticed  that patient was recently started on Wellbutrin by her primary care physician. Her daughter does not know about the same. She reported that she wants her to become more proactive. I reviewed her medication list. She was not taking Wellbutrin in the past. Advised patient to decrease the dose of Wellbutrin at this time. Her daughter is supportive of her but wants her to a involved in more activities and also get help from the home health care. Patient denied having any suicidal ideations or plans. She did not participate in the interview and was just sitting in the office.  .        Past Medical History:  Past Medical History:  Diagnosis Date  . Anxiety   . Depression   . GERD (gastroesophageal reflux disease)   . Hypercalcemia   . Pneumonia   . Sepsis ([place])   . Sleep apnea   . SOB (shortness of breath) on exertion     Past Surgical History:  Procedure Laterality Date  . ENDOMETRIAL ABLATION    . EYE SURGERY  2011   left cataract  . KNEE ARTHROSCOPY Left 05/03/2015   Procedure: left knee arthroscopy, partial medial menisectomy, chondroplasty;  Surgeon: Dereck Leep, MD;  Location: [facility] ORS;  Service: Orthopedics;  Laterality: Left;  . TONSILLECTOMY     Family History:  Family History  Problem Relation Age of Onset  . Cancer Mother 26       ovarian   . Pernicious anemia Mother   . COPD Father        nonsmoker  . Cancer Cousin        ovarian ca   Social History:   Social History  Socioeconomic History  . Marital status: Widowed    Spouse name: None  . Number of children: 5  . Years of education: None  . Highest education level: None  Social Needs  . Financial resource strain: Somewhat hard  . Food insecurity - worry: Sometimes true  . Food insecurity - inability: Sometimes true  . Transportation needs - medical: No  . Transportation needs - non-medical: No  Occupational History  . Occupation: retired  Tobacco Use  . Smoking status: Never Smoker  . Smokeless  tobacco: Never Used  Substance and Sexual Activity  . Alcohol use: No  . Drug use: No  . Sexual activity: Not Currently  Other Topics Concern  . None  Social History Narrative   Regular exercise: not really   Caffeine use: some   Additional Social History:  She lives with her daughter.  Has 2 daughters and 2 sons. One daughter killed in car wreck. She was only married x 1. Divorced x 30 years.  Worked briefly for 5 years.    Musculoskeletal: Strength & Muscle Tone: decreased Gait & Station: unsteady Patient leans: N/A  Psychiatric Specialty Exam: Medication Refill     Review of Systems  Constitutional: Positive for malaise/fatigue.  Musculoskeletal: Positive for joint pain.  Psychiatric/Behavioral: Positive for depression and memory loss. The patient is nervous/anxious.     Blood pressure 138/71, pulse 90, temperature 98.4 F (36.9 C), temperature source Oral, weight 195 lb 12.8 oz (88.8 kg).Body mass index is 35.81 kg/m.  General Appearance: Casual  Eye Contact:  Fair  Speech:  Slow  Volume:  Decreased  Mood:  Anxious and Depressed  Affect:  Flat  Thought Process:  Loose  Orientation:  Full (Time, Place, and Person)  Thought Content:  WDL  Suicidal Thoughts:  No  Homicidal Thoughts:  No  Memory:  Immediate;   Fair  Judgement:  Fair  Insight:  Fair  Psychomotor Activity:  Psychomotor Retardation  Concentration:  Fair  Recall:  [employer] of Knowledge:Fair  Language: Fair  Akathisia:  No  Handed:  Right  AIMS (if indicated):    Assets:  Communication Skills Desire for Improvement Physical Health Social Support  ADL's:  Intact  Cognition: WNL  Sleep:  5-6   Is the patient at risk to self?  No. Has the patient been a risk to self in the past 6 months?  No. Has the patient been a risk to self within the distant past?  No. Is the patient a risk to others?  No. Has the patient been a risk to others in the past 6 months?  No. Has the patient been a risk  to others within the distant past?  No.  Allergies:   Allergies  Allergen Reactions  . Codeine Other (See Comments)    Crying spells  . Darvon [Propoxyphene Hcl] Other (See Comments)    Crying spells  . Propoxyphene Other (See Comments)    Crying, altered mental status   Current Medications: Current Outpatient Medications  Medication Sig Dispense Refill  . ARIPiprazole (ABILIFY) 2 MG tablet Take 1 tablet (2 mg total) by mouth at bedtime. 30 tablet 2  . aspirin 325 MG EC tablet Take 325 mg by mouth every 6 (six) hours as needed for pain.    . busPIRone (BUSPAR) 5 MG tablet Take 1 tablet (5 mg total) by mouth 2 (two) times daily. 60 tablet 2  . Cholecalciferol (VITAMIN D-3 PO) Take 1 capsule by mouth daily. 1000iu    .  escitalopram (LEXAPRO) 10 MG tablet Take 1 tablet (10 mg total) by mouth daily. 30 tablet 2  . loperamide (IMODIUM) 2 MG capsule Take 2 mg by mouth 2 (two) times daily.    Marland Kitchen omeprazole (PRILOSEC) 20 MG capsule Take 1 capsule (20 mg total) by mouth daily. 30 capsule 5  . triamcinolone cream (KENALOG) 0.1 % Apply topically 2 (two) times daily. 454 g 1  . vitamin B-12 (CYANOCOBALAMIN) 1000 MCG tablet Take by mouth.    Marland Kitchen buPROPion (WELLBUTRIN) 75 MG tablet Take 1 tablet (75 mg total) by mouth every morning. 30 tablet 2   No current facility-administered medications for this visit.     Previous Psychotropic Medications:  Does not remember.  No psychiatric hospitalization.    Substance Abuse History in the last 12 months:  No.  Consequences of Substance Abuse: Negative NA  Medical Decision Making:  Review of Psycho-Social Stressors (1) and Review and summation of old records (2)  Treatment Plan Summary: Medication management   Discussed patient and her daughter about the medications treatment risks benefits and alternatives.  Patient will continue on the medications as follows Decrease Wellbutrin 75 mg in the morning. She will continue on Lexapro 10 mg in the  morning. Continue BuSpar 5 mg Bid  Abilify 2 mg in the evening    Discussed about the side effects in detail and she agreed with the plan  She will follow-up in 2 month or earlier depending on her symptoms   More than 50% of the time spent in psychoeducation, counseling and coordination of care.     This note was generated in part or whole with voice recognition software. Voice regonition is usually quite accurate but there are transcription errors that can and very often do occur. I apologize for any typographical errors that were not detected and corrected.     Rainey Pines, MD  2/4/201911:44 AM

## 2017-09-18 ENCOUNTER — Ambulatory Visit: Payer: Medicare Other | Admitting: Internal Medicine

## 2017-09-18 ENCOUNTER — Encounter: Payer: Self-pay | Admitting: Internal Medicine

## 2017-09-18 DIAGNOSIS — R42 Dizziness and giddiness: Secondary | ICD-10-CM

## 2017-09-18 DIAGNOSIS — M7989 Other specified soft tissue disorders: Secondary | ICD-10-CM | POA: Diagnosis not present

## 2017-09-18 DIAGNOSIS — R7303 Prediabetes: Secondary | ICD-10-CM | POA: Insufficient documentation

## 2017-09-18 DIAGNOSIS — N183 Chronic kidney disease, stage 3 unspecified: Secondary | ICD-10-CM

## 2017-09-18 DIAGNOSIS — R252 Cramp and spasm: Secondary | ICD-10-CM | POA: Insufficient documentation

## 2017-09-18 DIAGNOSIS — R600 Localized edema: Secondary | ICD-10-CM

## 2017-09-18 DIAGNOSIS — M810 Age-related osteoporosis without current pathological fracture: Secondary | ICD-10-CM | POA: Diagnosis not present

## 2017-09-18 MED ORDER — FUROSEMIDE 20 MG PO TABS: 20.0000 mg | ORAL_TABLET | Freq: Every day | ORAL | 2 refills | Status: DC | PRN

## 2017-09-18 NOTE — Patient Instructions (Addendum)
F/u in 1-2 months sooner if needed   Dizziness Dizziness is a common problem. It is a feeling of unsteadiness or light-headedness. You may feel like you are about to faint. Dizziness can lead to injury if you stumble or fall. Anyone can become dizzy, but dizziness is more common in older adults. This condition can be caused by a number of things, including medicines, dehydration, or illness. Follow these instructions at home: Eating and drinking  Drink enough fluid to keep your urine clear or pale yellow. This helps to keep you from becoming dehydrated. Try to drink more clear fluids, such as water.  Do not drink alcohol.  Limit your caffeine intake if told to do so by your health care provider. Check ingredients and nutrition facts to see if a food or beverage contains caffeine.  Limit your salt (sodium) intake if told to do so by your health care provider. Check ingredients and nutrition facts to see if a food or beverage contains sodium. Activity  Avoid making quick movements. ? Rise slowly from chairs and steady yourself until you feel okay. ? In the morning, first sit up on the side of the bed. When you feel okay, stand slowly while you hold onto something until you know that your balance is fine.  If you need to stand in one place for a long time, move your legs often. Tighten and relax the muscles in your legs while you are standing.  Do not drive or use heavy machinery if you feel dizzy.  Avoid bending down if you feel dizzy. Place items in your home so that they are easy for you to reach without leaning over. Lifestyle  Do not use any products that contain nicotine or tobacco, such as cigarettes and e-cigarettes. If you need help quitting, ask your health care provider.  Try to reduce your stress level by using methods such as yoga or meditation. Talk with your health care provider if you need help to manage your stress. General instructions  Watch your dizziness for any  changes.  Take over-the-counter and prescription medicines only as told by your health care provider. Talk with your health care provider if you think that your dizziness is caused by a medicine that you are taking.  Tell a friend or a family member that you are feeling dizzy. If he or she notices any changes in your behavior, have this person call your health care provider.  Keep all follow-up visits as told by your health care provider. This is important. Contact a health care provider if:  Your dizziness does not go away.  Your dizziness or light-headedness gets worse.  You feel nauseous.  You have reduced hearing.  You have new symptoms.  You are unsteady on your feet or you feel like the room is spinning. Get help right away if:  You vomit or have diarrhea and are unable to eat or drink anything.  You have problems talking, walking, swallowing, or using your arms, hands, or legs.  You feel generally weak.  You are not thinking clearly or you have trouble forming sentences. It may take a friend or family member to notice this.  You have chest pain, abdominal pain, shortness of breath, or sweating.  Your vision changes.  You have any bleeding.  You have a severe headache.  You have neck pain or a stiff neck.  You have a fever. These symptoms may represent a serious problem that is an emergency. Do not wait to see  if the symptoms will go away. Get medical help right away. Call your local emergency services (911 in the [place] Do not drive yourself to the hospital. Summary  Dizziness is a feeling of unsteadiness or light-headedness. This condition can be caused by a number of things, including medicines, dehydration, or illness.  Anyone can become dizzy, but dizziness is more common in older adults.  Drink enough fluid to keep your urine clear or pale yellow. Do not drink alcohol.  Avoid making quick movements if you feel dizzy. Monitor your dizziness for any  changes. This information is not intended to replace advice given to you by your health care provider. Make sure you discuss any questions you have with your health care provider. Document Released: 01/22/2001 Document Revised: 08/31/2016 Document Reviewed: 08/31/2016 Elsevier Interactive Patient Education  2018 [employer].  Hypercalcemia Hypercalcemia is having too much calcium in the blood. The body needs calcium to make bones and keep them strong. Calcium also helps the muscles, nerves, brain, and heart work the way they should. Most of the calcium in the body is in the bones. There is also some calcium in the blood. Hypercalcemia can happen when calcium comes out of the bones, or when the kidneys are not able to remove calcium from the blood. Hypercalcemia can be mild or severe. What are the causes? There are many possible causes of hypercalcemia. Common causes include:  Hyperparathyroidism. This is a condition in which the body produces too much parathyroid hormone. There are four parathyroid glands in your neck. These glands produce a chemical messenger (hormone) that helps the body absorb calcium from foods and helps your bones release calcium.  Certain kinds of cancer, such as lung cancer, breast cancer, or myeloma.  Less common causes of hypercalcemia include:  Getting too much calcium or vitamin D from your diet.  Kidney failure.  Hyperthyroidism.  Being on bed rest for a long time.  Certain medicines.  Infections.  Sarcoidosis.  What increases the risk? This condition is more likely to develop in:  Women.  People who are 60 years or older.  People who have a family history of hypercalcemia.  What are the signs or symptoms? Mild hypercalcemia that starts slowly may not cause symptoms. Severe, sudden hypercalcemia is more likely to cause symptoms, such as:  Loss of appetite.  Increased thirst and frequent urination.  Fatigue.  Nausea and  vomiting.  Headache.  Abdominal pain.  Muscle pain, twitching, or weakness.  Constipation.  Blood in the urine.  Pain in the side of the back (flank pain).  Anxiety, confusion, or depression.  Irregular heartbeat (arrhythmia).  Loss of consciousness.  How is this diagnosed? This condition may be diagnosed based on:  Your symptoms.  Blood tests.  Urine tests.  X-rays.  Ultrasound.  MRI.  CT scan.  How is this treated? Treatment for hypercalcemia depends on the cause. Treatment may include:  Receiving fluids through an IV tube.  Medicines that keep calcium levels steady after receiving fluids (loop diuretics).  Medicines that keep calcium in your bones (bisphosphonates).  Medicines that lower the calcium level in your blood.  Surgery to remove overactive parathyroid glands.  Follow these instructions at home:  Take over-the-counter and prescription medicines only as told by your health care provider.  Follow instructions from your health care provider about eating or drinking restrictions.  Drink enough fluid to keep your urine clear or pale yellow.  Stay active. Weight-bearing exercise helps to keep calcium in your  bones. Follow instructions from your health care provider about what type and level of exercise is safe for you.  Keep all follow-up visits as told by your health care provider. This is important. Contact a health care provider if:  You have a fever.  You have flank or abdominal pain that is getting worse. Get help right away if:  You have severe abdominal or flank pain.  You have chest pain.  You have trouble breathing.  You become very confused and sleepy.  You lose consciousness. This information is not intended to replace advice given to you by your health care provider. Make sure you discuss any questions you have with your health care provider. Document Released: 10/12/2004 Document Revised: 01/04/2016 Document Reviewed:  12/14/2014 Elsevier Interactive Patient Education  2018 [employer].    Edema Edema is an abnormal buildup of fluids in your bodytissues. Edema is somewhatdependent on gravity to pull the fluid to the lowest place in your body. That makes the condition more common in the legs and thighs (lower extremities). Painless swelling of the feet and ankles is common and becomes more likely as you get older. It is also common in looser tissues, like around your eyes. When the affected area is squeezed, the fluid may move out of that spot and leave a dent for a few moments. This dent is called pitting. What are the causes? There are many possible causes of edema. Eating too much salt and being on your feet or sitting for a long time can cause edema in your legs and ankles. Hot weather may make edema worse. Common medical causes of edema include:  Heart failure.  Liver disease.  Kidney disease.  Weak blood vessels in your legs.  Cancer.  An injury.  Pregnancy.  Some medications.  Obesity.  What are the signs or symptoms? Edema is usually painless.Your skin may look swollen or shiny. How is this diagnosed? Your health care provider may be able to diagnose edema by asking about your medical history and doing a physical exam. You may need to have tests such as X-rays, an electrocardiogram, or blood tests to check for medical conditions that may cause edema. How is this treated? Edema treatment depends on the cause. If you have heart, liver, or kidney disease, you need the treatment appropriate for these conditions. General treatment may include:  Elevation of the affected body part above the level of your heart.  Compression of the affected body part. Pressure from elastic bandages or support stockings squeezes the tissues and forces fluid back into the blood vessels. This keeps fluid from entering the tissues.  Restriction of fluid and salt intake.  Use of a water pill (diuretic).  These medications are appropriate only for some types of edema. They pull fluid out of your body and make you urinate more often. This gets rid of fluid and reduces swelling, but diuretics can have side effects. Only use diuretics as directed by your health care provider.  Follow these instructions at home:  Keep the affected body part above the level of your heart when you are lying down.  Do not sit still or stand for prolonged periods.  Do not put anything directly under your knees when lying down.  Do not wear constricting clothing or garters on your upper legs.  Exercise your legs to work the fluid back into your blood vessels. This may help the swelling go down.  Wear elastic bandages or support stockings to reduce ankle swelling as  directed by your health care provider.  Eat a low-salt diet to reduce fluid if your health care provider recommends it.  Only take medicines as directed by your health care provider. Contact a health care provider if:  Your edema is not responding to treatment.  You have heart, liver, or kidney disease and notice symptoms of edema.  You have edema in your legs that does not improve after elevating them.  You have sudden and unexplained weight gain. Get help right away if:  You develop shortness of breath or chest pain.  You cannot breathe when you lie down.  You develop pain, redness, or warmth in the swollen areas.  You have heart, liver, or kidney disease and suddenly get edema.  You have a fever and your symptoms suddenly get worse. This information is not intended to replace advice given to you by your health care provider. Make sure you discuss any questions you have with your health care provider. Document Released: 07/29/2005 Document Revised: 01/04/2016 Document Reviewed: 05/21/2013 Elsevier Interactive Patient Education  2017 [employer]  Leg Cramps Leg cramps occur when a muscle or muscles tighten and you have no control over  this tightening (involuntary muscle contraction). Muscle cramps can develop in any muscle, but the most common place is in the calf muscles of the leg. Those cramps can occur during exercise or when you are at rest. Leg cramps are painful, and they may last for a few seconds to a few minutes. Cramps may return several times before they finally stop. Usually, leg cramps are not caused by a serious medical problem. In many cases, the cause is not known. Some common causes include:  Overexertion.  Overuse from repetitive motions, or doing the same thing over and over.  Remaining in a certain position for a long period of time.  Improper preparation, form, or technique while performing a sport or an activity.  Dehydration.  Injury.  Side effects of some medicines.  Abnormally low levels of the salts and ions in your blood (electrolytes), especially potassium and calcium. These levels could be low if you are taking water pills (diuretics) or if you are pregnant.  Follow these instructions at home: Watch your condition for any changes. Taking the following actions may help to lessen any discomfort that you are feeling:  Stay well-hydrated. Drink enough fluid to keep your urine clear or pale yellow.  Try massaging, stretching, and relaxing the affected muscle. Do this for several minutes at a time.  For tight or tense muscles, use a warm towel, heating pad, or hot shower water directed to the affected area.  If you are sore or have pain after a cramp, applying ice to the affected area may relieve discomfort. ? Put ice in a plastic bag. ? Place a towel between your skin and the bag. ? Leave the ice on for 20 minutes, 2-3 times per day.  Avoid strenuous exercise for several days if you have been having frequent leg cramps.  Make sure that your diet includes the essential minerals for your muscles to work normally.  Take medicines only as directed by your health care provider.  Contact a  health care provider if:  Your leg cramps get more severe or more frequent, or they do not improve over time.  Your foot becomes cold, numb, or blue. This information is not intended to replace advice given to you by your health care provider. Make sure you discuss any questions you have with your health  care provider. Document Released: 09/05/2004 Document Revised: 01/04/2016 Document Reviewed: 07/06/2014 Elsevier Interactive Patient Education  [employer].

## 2017-09-18 NOTE — Progress Notes (Signed)
Pre visit review using our clinic review tool, if applicable. No additional management support is needed unless otherwise documented below in the visit note. 

## 2017-09-19 ENCOUNTER — Telehealth: Payer: Self-pay | Admitting: Family

## 2017-09-19 LAB — PROTEIN ELECTROPHORESIS, URINE REFLEX
Albumin ELP, Urine: 42.3 %
Alpha-1-Globulin, U: 2.3 %
Alpha-2-Globulin, U: 10.9 %
BETA GLOBULIN, U: 28.2 %
GAMMA GLOBULIN, U: 16.3 %
Protein, Ur: 19.4 mg/dL

## 2017-09-19 NOTE — Telephone Encounter (Signed)
Copied from [place]. Topic: Referral - Request >> Sep 19, 2017  1:45 PM Bea Graff, NT wrote: Reason for CRM: Pt had a referral to a endocrinologist in [place] but daughter calling and states [place] is just to far for her mom to travel and would like a referral sent somewhere in [place] for her mom.

## 2017-09-20 ENCOUNTER — Encounter: Payer: Self-pay | Admitting: Internal Medicine

## 2017-09-20 NOTE — Progress Notes (Signed)
Chief Complaint  Patient presents with  . Follow-up   F/u son Wille Glaser and daughter Juanita Craver with her today and have questions and would like to review labs  1. Hypercalcemia-no clear etiology she is off hctz. Reviewed causes of elevated calcium and pt had further labs today. Family has several questions on causes and w/u answered and reviewed today  2. Reviewed prior DEXA +osteoporosis pt is not on any medications for this  3. C/o dizziness worsening x 2 weeks and intermittent.  Denies recent falls. She does use walker to walk at baseline (BL). orthostatics attempted today but only able to do sitting and standing as pt did not want to lie down too difficult to get up. Those values were normal and neg orthostatics. Pt c/o dizziness and daughter reports pt is requiring more help with Adls at home due to gait unstable with walker  4. C/o leg cramps  5. C/o leg edema she has been hesitant in the past to do compression stockings b/c daughter reports it is hard her daughter to pull on and pt tells her to stop    Review of Systems  Constitutional:       Down 9 lbs since last visit   HENT: Negative for hearing loss.   Eyes:       No vision problems   Respiratory: Negative for shortness of breath.   Cardiovascular: Positive for leg swelling. Negative for chest pain.  Gastrointestinal: Negative for abdominal pain.  Musculoskeletal: Negative for falls.       +leg cramps   Skin: Negative for rash.  Neurological: Positive for dizziness.  Psychiatric/Behavioral: Positive for memory loss. Negative for depression.   Past Medical History:  Diagnosis Date  . Anxiety   . Depression   . GERD (gastroesophageal reflux disease)   . Hypercalcemia   . Pneumonia   . Sepsis ([place])   . Sleep apnea   . SOB (shortness of breath) on exertion    Past Surgical History:  Procedure Laterality Date  . ENDOMETRIAL ABLATION    . EYE SURGERY  2011   left cataract  . KNEE ARTHROSCOPY Left 05/03/2015   Procedure: left knee  arthroscopy, partial medial menisectomy, chondroplasty;  Surgeon: Dereck Leep, MD;  Location: [facility] ORS;  Service: Orthopedics;  Laterality: Left;  . TONSILLECTOMY     Family History  Problem Relation Age of Onset  . Cancer Mother 16       ovarian   . Pernicious anemia Mother   . COPD Father        nonsmoker  . Cancer Cousin        ovarian ca   Social History   Socioeconomic History  . Marital status: Widowed    Spouse name: Not on file  . Number of children: 5  . Years of education: Not on file  . Highest education level: Not on file  Social Needs  . Financial resource strain: Somewhat hard  . Food insecurity - worry: Sometimes true  . Food insecurity - inability: Sometimes true  . Transportation needs - medical: No  . Transportation needs - non-medical: No  Occupational History  . Occupation: retired  Tobacco Use  . Smoking status: Never Smoker  . Smokeless tobacco: Never Used  Substance and Sexual Activity  . Alcohol use: No  . Drug use: No  . Sexual activity: Not Currently  Other Topics Concern  . Not on file  Social History Narrative   Regular exercise: not really   Caffeine  use: some   Current Meds  Medication Sig  . ARIPiprazole (ABILIFY) 2 MG tablet Take 1 tablet (2 mg total) by mouth at bedtime.  Marland Kitchen aspirin 325 MG EC tablet Take 325 mg by mouth every 6 (six) hours as needed for pain.  Marland Kitchen buPROPion (WELLBUTRIN) 75 MG tablet Take 1 tablet (75 mg total) by mouth every morning.  . busPIRone (BUSPAR) 5 MG tablet Take 1 tablet (5 mg total) by mouth 2 (two) times daily.  . Cholecalciferol (VITAMIN D-3 PO) Take 1 capsule by mouth daily. 1000iu  . escitalopram (LEXAPRO) 10 MG tablet Take 1 tablet (10 mg total) by mouth daily.  Marland Kitchen loperamide (IMODIUM) 2 MG capsule Take 2 mg by mouth 2 (two) times daily.  Marland Kitchen omeprazole (PRILOSEC) 20 MG capsule Take 1 capsule (20 mg total) by mouth daily.  Marland Kitchen triamcinolone cream (KENALOG) 0.1 % Apply topically 2 (two) times daily.  .  vitamin B-12 (CYANOCOBALAMIN) 1000 MCG tablet Take by mouth.   Allergies  Allergen Reactions  . Codeine Other (See Comments)    Crying spells  . Darvon [Propoxyphene Hcl] Other (See Comments)    Crying spells  . Propoxyphene Other (See Comments)    Crying, altered mental status   Recent Results (from the past 2160 hour(s))  Comprehensive metabolic panel     Status: Abnormal   Collection Time: 08/21/17  3:18 PM  Result Value Ref Range   Sodium 139 135 - 145 mEq/L   Potassium 4.1 3.5 - 5.1 mEq/L   Chloride 101 96 - 112 mEq/L   CO2 30 19 - 32 mEq/L   Glucose, Bld 80 70 - 99 mg/dL   BUN 14 6 - 23 mg/dL   Creatinine, Ser 1.11 0.40 - 1.20 mg/dL   Total Bilirubin 0.5 0.2 - 1.2 mg/dL   Alkaline Phosphatase 60 39 - 117 U/L   AST 23 0 - 37 U/L   ALT 22 0 - 35 U/L   Total Protein 7.5 6.0 - 8.3 g/dL   Albumin 4.3 3.5 - 5.2 g/dL   Calcium 11.0 (H) 8.4 - 10.5 mg/dL   GFR 49.84 (L) >60.00 mL/min  CBC w/Diff     Status: None   Collection Time: 08/21/17  3:18 PM  Result Value Ref Range   WBC 5.9 4.0 - 10.5 K/uL   RBC 5.07 3.87 - 5.11 Mil/uL   Hemoglobin 15.0 12.0 - 15.0 g/dL   HCT 45.3 36.0 - 46.0 %   MCV 89.3 78.0 - 100.0 fl   MCHC 33.1 30.0 - 36.0 g/dL   RDW 14.4 11.5 - 15.5 %   Platelets 269.0 150.0 - 400.0 K/uL   Neutrophils Relative % 55.7 43.0 - 77.0 %   Lymphocytes Relative 33.2 12.0 - 46.0 %   Monocytes Relative 7.7 3.0 - 12.0 %   Eosinophils Relative 2.7 0.0 - 5.0 %   Basophils Relative 0.7 0.0 - 3.0 %   Neutro Abs 3.3 1.4 - 7.7 K/uL   Lymphs Abs 2.0 0.7 - 4.0 K/uL   Monocytes Absolute 0.5 0.1 - 1.0 K/uL   Eosinophils Absolute 0.2 0.0 - 0.7 K/uL   Basophils Absolute 0.0 0.0 - 0.1 K/uL  Hemoglobin A1c     Status: None   Collection Time: 08/21/17  3:18 PM  Result Value Ref Range   Hgb A1c MFr Bld 6.0 4.6 - 6.5 %    Comment: Glycemic Control Guidelines for People with Diabetes:Non Diabetic:  <6%Goal of Therapy: <7%Additional Action Suggested:  >8%  TSH     Status: None    Collection Time: 08/21/17  3:18 PM  Result Value Ref Range   TSH 3.73 0.35 - 4.50 uIU/mL  Urinalysis, Routine w reflex microscopic     Status: Abnormal   Collection Time: 08/21/17  3:18 PM  Result Value Ref Range   Color, Urine YELLOW Yellow;Lt. Yellow   APPearance Sl Cloudy (A) Clear   Specific Gravity, Urine >=1.030 (A) 1.000 - 1.030   pH 5.5 5.0 - 8.0   Total Protein, Urine TRACE (A) Negative   Urine Glucose NEGATIVE Negative   Ketones, ur NEGATIVE Negative   Bilirubin Urine NEGATIVE Negative   Hgb urine dipstick SMALL (A) Negative   Urobilinogen, UA 0.2 0.0 - 1.0   Leukocytes, UA MODERATE (A) Negative   Nitrite NEGATIVE Negative   WBC, UA 21-50/hpf (A) 0-2/hpf   RBC / HPF 7-10/hpf (A) 0-2/hpf   Mucus, UA Presence of (A) None   Squamous Epithelial / LPF Few(5-10/hpf) (A) Rare(0-4/hpf)   Bacteria, UA Rare(<10/hpf) (A) None  PTH, intact (no Ca)     Status: None   Collection Time: 08/21/17  3:18 PM  Result Value Ref Range   PTH 47 14 - 64 pg/mL    Comment: . Interpretive Guide    Intact PTH           Calcium ------------------    ----------           ------- Normal Parathyroid    Normal               Normal Hypoparathyroidism    Low or Low Normal    Low Hyperparathyroidism    Primary            Normal or High       High    Secondary          High                 Normal or Low    Tertiary           High                 High Non-Parathyroid    Hypercalcemia      Low or Low Normal    High .   Calcium, ionized     Status: Abnormal   Collection Time: 08/21/17  3:18 PM  Result Value Ref Range   Calcium, Ion 6.3 (H) 4.8 - 5.6 mg/dL  PTH-Related Peptide     Status: Abnormal   Collection Time: 08/21/17  3:18 PM  Result Value Ref Range   PTH-Related Protein (PTH-RP) 13 (L) 14 - 27 pg/mL    Comment: . This is a C-terminal PTH-RP assay. PTH-RP is useful in the differential diagnosis of hypercalcemia and levels may be elevated in patients with tumor-associated  hypercalcemia. Elevated results may also be observed in patients with renal disease. . This test was developed and its analytical performance characteristics have been determined by [facility]. It has not been cleared or approved by FDA. This assay has been validated pursuant to the CLIA regulations and is used for clinical purposes.   T4, free     Status: None   Collection Time: 08/21/17  3:18 PM  Result Value Ref Range   Free T4 0.68 0.60 - 1.60 ng/dL    Comment: Specimens from patients who are undergoing biotin therapy and /or ingesting biotin supplements may contain high levels of  biotin.  The higher biotin concentration in these specimens interferes with this Free T4 assay.  Specimens that contain high levels  of biotin may cause false high results for this Free T4 assay.  Please interpret results in light of the total clinical presentation of the patient.    Vitamin D 1,25 dihydroxy     Status: None   Collection Time: 08/21/17  3:18 PM  Result Value Ref Range   Vitamin D 1, 25 (OH)2 Total 40 18 - 72 pg/mL   Vitamin D3 1, 25 (OH)2 40 pg/mL   Vitamin D2 1, 25 (OH)2 <8 pg/mL    Comment: Marland Kitchen Vitamin D3, 1,25(OH) indicates both endogenous production and supplementation. Vitamin D2, 1,25(OH)2 is an indicator of exogenous sources, such as diet or supplementation.  Interpretation and therapy are based on measurement of Vitamin D,1,25(OH)2, Total. . . This test was developed and its analytical performance characteristics have been determined by [facility], [place], [place]. It has not been cleared or approved by the FDA. This assay has been validated pursuant to the CLIA regulations and is used for clinical purposes. .   TEST AUTHORIZATION     Status: None   Collection Time: 08/21/17  3:18 PM  Result Value Ref Range   TEST NAME: VITAMIN D, 1,25 DIHYDROXY    TEST CODE: [id number]    CLIENT CONTACT: LATOYA W     REPORT ALWAYS MESSAGE SIGNATURE      Comment: . The laboratory testing on this patient was verbally requested or confirmed by the ordering physician or his or her authorized representative after contact with an employee of [employer]. Federal regulations require that we maintain on file written authorization for all laboratory testing.  Accordingly we are asking that the ordering physician or his or her authorized representative sign a copy of this report and promptly return it to the client service representative. . . Signature:____________________________________________________ . Please fax this signed page to [phone] or return it via your [employer] courier.   Vitamin D (25 hydroxy)     Status: None   Collection Time: 08/21/17  3:29 PM  Result Value Ref Range   VITD 32.40 30.00 - 100.00 ng/mL  Protein Electrophoresis, Urine Rflx.     Status: None   Collection Time: 09/18/17  4:05 PM  Result Value Ref Range   Protein, Ur 19.4 Not Estab. mg/dL   Albumin ELP, Urine 42.3 %   Alpha-1-Globulin, U 2.3 %   Alpha-2-Globulin, U 10.9 %   Beta Globulin, U 28.2 %   Gamma Globulin, U 16.3 %   M Component, Ur Not Observed Not Observed %   Please Note: Comment     Comment: Protein electrophoresis scan will follow via computer, mail, or courier delivery.   Protein Electrophoresis, (serum)     Status: None (In process)   Collection Time: 09/18/17  4:05 PM  Result Value Ref Range   Total Protein 6.9 6.1 - 8.1 g/dL   Objective  Body mass index is 35.85 kg/m. Wt Readings from Last 3 Encounters:  09/18/17 196 lb (88.9 kg)  08/21/17 205 lb 6 oz (93.2 kg)  05/30/16 197 lb 9.6 oz (89.6 kg)   Temp Readings from Last 3 Encounters:  09/18/17 98.4 F (36.9 C) (Oral)  08/21/17 98.3 F (36.8 C) (Oral)  05/30/16 98 F (36.7 C) (Oral)   BP Readings from Last 3 Encounters:  09/18/17 110/64  08/21/17 110/74  05/30/16 126/60   Pulse Readings from Last 3 Encounters:  09/18/17 74  08/21/17 66  05/30/16 86   O2 sat room air 97%   Physical Exam  Constitutional: She is oriented to person, place, and time and well-developed, well-nourished, and in no distress. Vital signs are normal.  HENT:  Head: Normocephalic and atraumatic.  Mouth/Throat: Oropharynx is clear and moist and mucous membranes are normal.  Eyes: Conjunctivae are normal. Pupils are equal, round, and reactive to light.  Cardiovascular: Normal rate, regular rhythm and normal heart sounds.  1 to 2+ leg edema b/l   Pulmonary/Chest: Effort normal and breath sounds normal.  Neurological: She is alert and oriented to person, place, and time. Gait normal.  BL walking with walker   Skin: Skin is warm, dry and intact.  Psychiatric: Mood, memory, affect and judgment normal.  Nursing note and vitals reviewed.   Assessment   1. Hypercalcemia ddx hyperparathyroidism (this was on PCPs problem list from 2 years ago if parathyroid adenoma per family pt does not want surgery), r/o MM with SPEP/UPEP, vit D slightly low 29, thyroid labs are normal. PTHrp neg, other ddx familial cause, malignancy also on ddx as well as MEN syndrome, chronic granulomatous disease, meds though pt stopped HCTZ a while ago  2. Osteoporosis noted 05/23/10 with CKD 3  3. Dizziness x 2 weeks. Orthostatics checked in only 2 positions so not totally accurate those 2 positions neg though 4.leg cramps  5. Leg edema  6. HM  Plan  1. Pending SPEP/UPEP neg M spike Consider CXR in future  Will refer to Endocrine further w/u and figure out etiology family agreeable  2. Defer tx Endocrine consider prolia vs other tx  rec take vitamin D3 1000 iu qd. Hold on supplemental calcium and she is not currently taking  3.  Consider CT head if does not get better pt and family to call back for now hold CT head per family request  4. Increase water intake,heat, massage  If continues consider r/o PAD  5.  Rx compression stockings 20-30 mmhg  will see if can get zip up written on Rx  Trial prn lasix 20 mg qd prn  Consider echo if continues  6.   Had flu shot, prevnar, pna 23,  Will need tdap and shingrix if has not had disc at f/u  Last colonoscopy and mammo 12/25/11  Check A1C, CBC, UA, TSH, freeT4 today hold on lipid not fasting.   >30 minutes spent with patient and family developing treatment plan, reviewing labs and disc etiology hypercalcemia Provider: Dr. Olivia Mackie McLean-Scocuzza-Internal Medicine

## 2017-09-22 NOTE — Telephone Encounter (Signed)
Dr Aundra Dubin where would you like me to send pt referral too in [place]? Pt states [place] [facility] is too far. Please advice? Thank you!

## 2017-09-22 NOTE — Telephone Encounter (Signed)
[facility] is ok   Thanks tMS

## 2017-09-23 LAB — PROTEIN ELECTROPHORESIS, SERUM
ALBUMIN ELP: 4 g/dL (ref 3.8–4.8)
ALPHA 1: 0.3 g/dL (ref 0.2–0.3)
ALPHA 2: 0.8 g/dL (ref 0.5–0.9)
BETA 2: 0.4 g/dL (ref 0.2–0.5)
Beta Globulin: 0.5 g/dL (ref 0.4–0.6)
Gamma Globulin: 1 g/dL (ref 0.8–1.7)
Total Protein: 6.9 g/dL (ref 6.1–8.1)

## 2017-09-24 ENCOUNTER — Telehealth: Payer: Self-pay | Admitting: Family

## 2017-09-24 NOTE — Telephone Encounter (Signed)
Pt given results per notes of Dr. Terese Door on 09/18/17 Unable to document in result note due to result note not being routed to [facility]. Informed of [place] referral.

## 2017-10-27 ENCOUNTER — Other Ambulatory Visit: Payer: Self-pay

## 2017-10-27 ENCOUNTER — Ambulatory Visit (INDEPENDENT_AMBULATORY_CARE_PROVIDER_SITE_OTHER): Payer: No Typology Code available for payment source | Admitting: Psychiatry

## 2017-10-27 ENCOUNTER — Encounter: Payer: Self-pay | Admitting: Psychiatry

## 2017-10-27 VITALS — BP 151/71 | HR 86 | Wt 194.8 lb

## 2017-10-27 DIAGNOSIS — F331 Major depressive disorder, recurrent, moderate: Secondary | ICD-10-CM

## 2017-10-27 DIAGNOSIS — Z634 Disappearance and death of family member: Secondary | ICD-10-CM | POA: Diagnosis not present

## 2017-10-27 MED ORDER — ARIPIPRAZOLE 2 MG PO TABS: 2.0000 mg | ORAL_TABLET | Freq: Every day | ORAL | 2 refills | Status: DC

## 2017-10-27 MED ORDER — BUPROPION HCL 75 MG PO TABS: 75.0000 mg | ORAL_TABLET | Freq: Every morning | ORAL | 2 refills | Status: DC

## 2017-10-27 MED ORDER — ESCITALOPRAM OXALATE 10 MG PO TABS: 10.0000 mg | ORAL_TABLET | Freq: Every day | ORAL | 2 refills | Status: DC

## 2017-10-27 NOTE — Progress Notes (Signed)
Psychiatric MD Progress Note  Patient Identification: [name] MRN:  [id number] Date of Evaluation:  10/27/2017 Referral Source: Ronette Deter, M.D  Chief Complaint:   Chief Complaint    Follow-up; Medication Refill     Visit Diagnosis:    ICD-10-CM   1. MDD (major depressive disorder), recurrent episode, moderate (HCC) F33.1   2. Bereavement Z63.4    Diagnosis:   Patient Active Problem List   Diagnosis Date Noted  . Dizziness [R42] 09/18/2017  . Leg cramps [R25.2] 09/18/2017  . Osteoporosis [M81.0] 09/18/2017  . CKD (chronic kidney disease) stage 3, GFR 30-59 ml/min (HCC) [N18.3] 09/18/2017  . Prediabetes [R73.03] 09/18/2017  . Hypercalcemia [E83.52] 08/24/2017  . Skin lesion [L98.9] 05/30/2016  . Leg swelling [M79.89] 05/30/2016  . Allergic rhinitis [J30.9] 12/03/2015  . UTI symptoms [R39.9] 08/30/2015  . Right flank pain [R10.9] 08/30/2015  . Hearing loss [H91.90] 06/26/2015  . Major depressive disorder with single episode [F32.9] 04/15/2015  . OSA (obstructive sleep apnea) [G47.33] 10/27/2014  . Left knee pain [M25.562] 10/27/2014  . Obstructive apnea [G47.33] 10/27/2014  . Urge incontinence of urine [N39.41] 04/22/2014  . Incomplete uterine prolapse [N81.2] 04/22/2014  . Cystocele [IMO0002] 12/29/2013  . Onychomycosis [B35.1] 08/09/2013  . Dermatophytic onychia [B35.1] 08/09/2013  . Primary hyperparathyroidism ([place]) [E21.0] 05/28/2013  . Medicare annual wellness visit, subsequent [Z00.00] 05/07/2013  . Stasis dermatitis [I87.2] 05/07/2013  . 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 Diarrhea [R19.7] 11/11/2012  . Closed fracture of humerus, supracondylar [S42.413A] 10/09/2012  . GERD (gastroesophageal reflux disease) [K21.9] 06/29/2012  . Acid reflux [K21.9] 06/29/2012  . Depression [F32.9]    History of Present Illness:    Patient is a 84 year old female who presented  for follow-up accompanied by her  daughter.Daughter reported that patient has been depressed as her sister passed away last week.  Patient was not willing to visit her in [place] but her nephew and son came and they took her for the visitation.  Patient has been having some delusional thinking for the past couple of weeks.  Her daughter has been supportive of her.  Her daughter reported that she has been encouraging her to eat more but she has fair appetite.  She has been sleeping well.  She remains calm and spends most of the time sitting.  Patient did not interact during the interview and was just sitting and listening.  She remains at her baseline.  She was not having any behavioral problems at this time.  She denied having any suicidal or homicidal ideations or plans.  Her daughter is very supportive and has been taking care of her at this time.    She has been compliant with her medications. .        Past Medical History:  Past Medical History:  Diagnosis Date  . Anxiety   . Depression   . GERD (gastroesophageal reflux disease)   . Hypercalcemia   . Pneumonia   . Sepsis ([place])   . Sleep apnea   . SOB (shortness of breath) on exertion     Past Surgical History:  Procedure Laterality Date  . ENDOMETRIAL ABLATION    . EYE SURGERY  2011   left cataract  . KNEE ARTHROSCOPY Left 05/03/2015   Procedure: left knee arthroscopy, partial medial menisectomy, chondroplasty;  Surgeon: Dereck Leep, MD;  Location: [facility] ORS;  Service: Orthopedics;  Laterality: Left;  . TONSILLECTOMY     Family History:  Family History  Problem Relation Age of Onset  . Cancer Mother 78       ovarian   . Pernicious anemia Mother   . COPD Father        nonsmoker  . Cancer Cousin        ovarian ca   Social History:   Social History   Socioeconomic History  . Marital status: Widowed    Spouse name: None  . Number of children: 5  . Years of education: None  . Highest education level: None  Social Needs  . Financial resource  strain: Somewhat hard  . Food insecurity - worry: Sometimes true  . Food insecurity - inability: Sometimes true  . Transportation needs - medical: No  . Transportation needs - non-medical: No  Occupational History  . Occupation: retired  Tobacco Use  . Smoking status: Never Smoker  . Smokeless tobacco: Never Used  Substance and Sexual Activity  . Alcohol use: No  . Drug use: No  . Sexual activity: Not Currently  Other Topics Concern  . None  Social History Narrative   Regular exercise: not really   Caffeine use: some   Additional Social History:  She lives with her daughter.  Has 2 daughters and 2 sons. One daughter killed in car wreck. She was only married x 1. Divorced x 30 years.  Worked briefly for 5 years.    Musculoskeletal: Strength & Muscle Tone: decreased Gait & Station: unsteady Patient leans: N/A  Psychiatric Specialty Exam: Medication Refill     Review of Systems  Constitutional: Positive for malaise/fatigue.  Musculoskeletal: Positive for joint pain.  Psychiatric/Behavioral: Positive for depression and memory loss. The patient is nervous/anxious.     Blood pressure (!) 151/71, pulse 86, weight 194 lb 12.8 oz (88.4 kg).Body mass index is 35.63 kg/m.  General Appearance: Casual  Eye Contact:  Fair  Speech:  Slow  Volume:  Decreased  Mood:  Anxious and Depressed  Affect:  Flat  Thought Process:  Loose  Orientation:  Full (Time, Place, and Person)  Thought Content:  WDL  Suicidal Thoughts:  No  Homicidal Thoughts:  No  Memory:  Immediate;   Fair  Judgement:  Fair  Insight:  Fair  Psychomotor Activity:  Psychomotor Retardation  Concentration:  Fair  Recall:  [employer] of Knowledge:Fair  Language: Fair  Akathisia:  No  Handed:  Right  AIMS (if indicated):    Assets:  Communication Skills Desire for Improvement Physical Health Social Support  ADL's:  Intact  Cognition: WNL  Sleep:  5-6   Is the patient at risk to self?  No. Has the  patient been a risk to self in the past 6 months?  No. Has the patient been a risk to self within the distant past?  No. Is the patient a risk to others?  No. Has the patient been a risk to others in the past 6 months?  No. Has the patient been a risk to others within the distant past?  No.  Allergies:   Allergies  Allergen Reactions  . Codeine Other (See Comments)    Crying spells  . Darvon [Propoxyphene Hcl] Other (See Comments)    Crying spells  . Propoxyphene Other (See Comments)    Crying, altered mental status   Current Medications: Current Outpatient Medications  Medication Sig Dispense Refill  . ARIPiprazole (ABILIFY) 2 MG tablet Take 1 tablet (2 mg total) by mouth at bedtime. 90 tablet 2  . aspirin 325 MG EC  tablet Take 325 mg by mouth every 6 (six) hours as needed for pain.    Marland Kitchen buPROPion (WELLBUTRIN) 75 MG tablet Take 1 tablet (75 mg total) by mouth every morning. 90 tablet 2  . busPIRone (BUSPAR) 5 MG tablet Take 1 tablet (5 mg total) by mouth 2 (two) times daily. 60 tablet 2  . Cholecalciferol (VITAMIN D-3 PO) Take 1 capsule by mouth daily. 1000iu    . escitalopram (LEXAPRO) 10 MG tablet Take 1 tablet (10 mg total) by mouth daily. 90 tablet 2  . furosemide (LASIX) 20 MG tablet Take 1 tablet (20 mg total) by mouth daily as needed. 30 tablet 2  . loperamide (IMODIUM) 2 MG capsule Take 2 mg by mouth 2 (two) times daily.    Marland Kitchen omeprazole (PRILOSEC) 20 MG capsule Take 1 capsule (20 mg total) by mouth daily. 30 capsule 5  . triamcinolone cream (KENALOG) 0.1 % Apply topically 2 (two) times daily. 454 g 1  . vitamin B-12 (CYANOCOBALAMIN) 1000 MCG tablet Take by mouth.     No current facility-administered medications for this visit.     Previous Psychotropic Medications:  Does not remember.  No psychiatric hospitalization.    Substance Abuse History in the last 12 months:  No.  Consequences of Substance Abuse: Negative NA  Medical Decision Making:  Review of  Psycho-Social Stressors (1) and Review and summation of old records (2)  Treatment Plan Summary: Medication management   Discussed patient and her daughter about the medications treatment risks benefits and alternatives.  Patient will continue on the medications as follows Decrease Wellbutrin 75 mg in the morning. She will continue on Lexapro 10 mg in the morning. Continue BuSpar 5 mg Bid  Abilify 2 mg in the evening    Discussed about the side effects in detail and she agreed with the plan  She will follow-up in 2 month or earlier depending on her symptoms   More than 50% of the time spent in psychoeducation, counseling and coordination of care.     This note was generated in part or whole with voice recognition software. Voice regonition is usually quite accurate but there are transcription errors that can and very often do occur. I apologize for any typographical errors that were not detected and corrected.     Rainey Pines, MD  3/18/20193:59 PM

## 2017-11-17 ENCOUNTER — Ambulatory Visit: Payer: No Typology Code available for payment source | Admitting: Psychiatry

## 2017-11-20 ENCOUNTER — Ambulatory Visit: Payer: Medicare Other | Admitting: Internal Medicine

## 2017-11-21 ENCOUNTER — Ambulatory Visit: Payer: Medicare Other | Admitting: Podiatry

## 2017-11-24 ENCOUNTER — Ambulatory Visit: Payer: Medicare Other | Admitting: Family

## 2017-11-24 DIAGNOSIS — Z0289 Encounter for other administrative examinations: Secondary | ICD-10-CM

## 2017-12-05 ENCOUNTER — Encounter: Payer: Self-pay | Admitting: Podiatry

## 2017-12-05 ENCOUNTER — Ambulatory Visit: Payer: Medicare Other | Admitting: Podiatry

## 2017-12-05 DIAGNOSIS — B351 Tinea unguium: Secondary | ICD-10-CM | POA: Diagnosis not present

## 2017-12-05 DIAGNOSIS — M79609 Pain in unspecified limb: Secondary | ICD-10-CM | POA: Diagnosis not present

## 2017-12-08 NOTE — Progress Notes (Signed)
   SUBJECTIVE Patient presents to office today complaining of elongated, thickened nails that cause pain while ambulating in shoes. She is unable to trim her own nails. Patient is here for further evaluation and treatment.  Past Medical History:  Diagnosis Date  . Anxiety   . Depression   . GERD (gastroesophageal reflux disease)   . Hypercalcemia   . Pneumonia   . Sepsis (HCC)   . Sleep apnea   . SOB (shortness of breath) on exertion     OBJECTIVE General Patient is awake, alert, and oriented x 3 and in no acute distress. Derm Skin is dry and supple bilateral. Negative open lesions or macerations. Remaining integument unremarkable. Nails are tender, long, thickened and dystrophic with subungual debris, consistent with onychomycosis, 1-5 bilateral. No signs of infection noted. Vasc  DP and PT pedal pulses palpable bilaterally. Temperature gradient within normal limits.  Neuro Epicritic and protective threshold sensation grossly intact bilaterally.  Musculoskeletal Exam No symptomatic pedal deformities noted bilateral. Muscular strength within normal limits.  ASSESSMENT 1. Onychodystrophic nails 1-5 bilateral with hyperkeratosis of nails.  2. Onychomycosis of nail due to dermatophyte bilateral 3. Pain in foot bilateral  PLAN OF CARE 1. Patient evaluated today.  2. Instructed to maintain good pedal hygiene and foot care.  3. Mechanical debridement of nails 1-5 bilaterally performed using a nail nipper. Filed with dremel without incident.  4. Return to clinic in 3 mos.    Brent M. Evans, DPM [facility]  Dr. Brent M. Evans, DPM    [address]                                        [place], [place] [address]                Office [phone]  Fax [phone]     

## 2017-12-10 ENCOUNTER — Other Ambulatory Visit: Payer: Self-pay | Admitting: Psychiatry

## 2017-12-12 DIAGNOSIS — E213 Hyperparathyroidism, unspecified: Secondary | ICD-10-CM | POA: Diagnosis not present

## 2017-12-12 DIAGNOSIS — M810 Age-related osteoporosis without current pathological fracture: Secondary | ICD-10-CM | POA: Diagnosis not present

## 2017-12-12 DIAGNOSIS — N183 Chronic kidney disease, stage 3 (moderate): Secondary | ICD-10-CM | POA: Diagnosis not present

## 2017-12-16 ENCOUNTER — Other Ambulatory Visit: Payer: Self-pay

## 2017-12-16 DIAGNOSIS — R600 Localized edema: Secondary | ICD-10-CM

## 2017-12-16 NOTE — Telephone Encounter (Signed)
rxd by Dr Aundra Dubin  Nov 01/09/18

## 2017-12-17 ENCOUNTER — Other Ambulatory Visit: Payer: Self-pay | Admitting: Family

## 2017-12-17 DIAGNOSIS — R600 Localized edema: Secondary | ICD-10-CM

## 2017-12-17 MED ORDER — FUROSEMIDE 20 MG PO TABS: 20.0000 mg | ORAL_TABLET | Freq: Every day | ORAL | 0 refills | Status: DC | PRN

## 2017-12-17 NOTE — Telephone Encounter (Signed)
Call pt  I didn't prescribe lasix. It appears it was a trial for PRN. Ensure she is using prn. I know compression stockings hard for her.    I will give another rx for lasix however she also needs to eval from cardiology to ensure leg swelling is not heart related. We can discuss this during OV. She also needs to be educated that she needs her electrolytes, kidney function checked when on lasix. If she takes multiple doses, she DEFINITELY needs to let us know. Things like diarrhea can make electrolyte loss EVEN worse.    Has she seen endocrine yet?  Please schedule a f/u with me as I havent seen her in 2 years

## 2017-12-18 ENCOUNTER — Other Ambulatory Visit: Payer: Self-pay | Admitting: Family

## 2017-12-18 DIAGNOSIS — R600 Localized edema: Secondary | ICD-10-CM

## 2017-12-18 NOTE — Telephone Encounter (Signed)
Called and spoke with pharmacy and advised them to only fill #30 of Lasix .  Spoke with [name] daughter  , patient is taking Lasix daily. She has already been evaluated by cardiology and endocrinology.  She will follow up with you on 01/09/18.

## 2017-12-20 ENCOUNTER — Other Ambulatory Visit: Payer: Self-pay | Admitting: Psychiatry

## 2017-12-29 NOTE — Progress Notes (Signed)
Subjective:    Patient ID: [name], female    DOB: [birth year], 84 y.o.   MRN: [id number]  CC: KARLETTA [name] is a 84 y.o. female who presents today for follow up.   HPI: Accompanied by daughter today who reports memory concerns. Patient shares these concerns. Trouble keeping up with what day it is. Notes recalls childrens birthdays. Hasnt gotten lost. NO longer does cooking. Daughter reports delusion thoughts where thought dream was real and thought daughter was jail. Worries about car riding past her house, and keeps window curtains drawn. Has always been this way per daughter and patient. No h/o mania. No visual hallucinations.   Daughter fills medication box and patient self administers. Sister lives her. She doesn't know what medications she is taking. Unsure if taking lasix daily.    Osteoporosis - recently started fosamax under dr Gabriel Carina  Hyperparathyroidism- following with dr Gabriel Carina  Dizziness- improved. States 'much better' since seen  Last.  Not as often.  no falls. Vertigo has resolved.   Still states that she has chronic lower leg pain. Unchanged. Notes that Leg swelling improved. No orthopnea. cannot tell compression stockings help or not.  She doesn't exercise or walk much per daughter. She will get short of breath with short distances which is not new for her. No CP.   No h/o cardiac consult. No known h/o CHF.   Solum- 12/12/2017- counseled on hyperparathyroidism, DEXA  done . Declines neck US Faheem- 09/2017- decreases wellbutrin to 75mg  in the morning, continue lexapro 10mg , busar 5 bid, abilify 2mg  evening  HISTORY:  Past Medical History:  Diagnosis Date  . Anxiety   . Depression   . GERD (gastroesophageal reflux disease)   . Hypercalcemia   . Pneumonia   . Sepsis ([place])   . Sleep apnea   . SOB (shortness of breath) on exertion    Past Surgical History:  Procedure Laterality Date  . ENDOMETRIAL ABLATION    . EYE SURGERY  2011   left cataract  . KNEE  ARTHROSCOPY Left 05/03/2015   Procedure: left knee arthroscopy, partial medial menisectomy, chondroplasty;  Surgeon: Dereck Leep, MD;  Location: [facility] ORS;  Service: Orthopedics;  Laterality: Left;  . TONSILLECTOMY     Family History  Problem Relation Age of Onset  . Cancer Mother 59       ovarian   . Pernicious anemia Mother   . COPD Father        nonsmoker  . Cancer Cousin        ovarian ca    Allergies: Codeine; Darvon [propoxyphene hcl]; and Propoxyphene Current Outpatient Medications on File Prior to Visit  Medication Sig Dispense Refill  . buPROPion (WELLBUTRIN) 75 MG tablet Take 1 tablet (75 mg total) by mouth every morning. 90 tablet 2  . omeprazole (PRILOSEC) 20 MG capsule Take 1 capsule (20 mg total) by mouth daily. 30 capsule 5  . vitamin B-12 (CYANOCOBALAMIN) 1000 MCG tablet Take by mouth.    . ARIPiprazole (ABILIFY) 2 MG tablet TAKE 1 TABLET(2 MG) BY MOUTH AT BEDTIME 90 tablet 2  . aspirin 325 MG EC tablet Take 325 mg by mouth every 6 (six) hours as needed for pain.    . busPIRone (BUSPAR) 5 MG tablet TAKE 1 TABLET(5 MG) BY MOUTH TWICE DAILY 60 tablet 0  . Cholecalciferol (VITAMIN D-3 PO) Take 1 capsule by mouth daily. 1000iu    . escitalopram (LEXAPRO) 10 MG tablet Take 1 tablet (10 mg total) by  mouth daily. 90 tablet 2  . furosemide (LASIX) 20 MG tablet TAKE 1 TABLET(20 MG) BY MOUTH DAILY AS NEEDED 90 tablet 0  . loperamide (IMODIUM) 2 MG capsule Take 2 mg by mouth 2 (two) times daily.    Marland Kitchen triamcinolone cream (KENALOG) 0.1 % Apply topically 2 (two) times daily. 454 g 1   No current facility-administered medications on file prior to visit.     Social History   Tobacco Use  . Smoking status: Never Smoker  . Smokeless tobacco: Never Used  Substance Use Topics  . Alcohol use: No  . Drug use: No    Review of Systems  Constitutional: Negative for chills and fever.  Respiratory: Positive for shortness of breath. Negative for cough.   Cardiovascular: Negative  for chest pain, palpitations and leg swelling (resolved).  Gastrointestinal: Negative for nausea and vomiting.  Neurological: Positive for dizziness (improved).      Objective:    BP 122/72 (BP Location: Left Arm, Patient Position: Sitting, Cuff Size: Normal)   Pulse 76   Temp 98.3 F (36.8 C) (Oral)   Resp 16   Wt 195 lb 2 oz (88.5 kg)   SpO2 98%   BMI 35.69 kg/m  BP Readings from Last 3 Encounters:  01/09/18 122/72  10/27/17 (!) 151/71  09/18/17 110/64   Wt Readings from Last 3 Encounters:  01/09/18 195 lb 2 oz (88.5 kg)  10/27/17 194 lb 12.8 oz (88.4 kg)  09/18/17 196 lb (88.9 kg)    Physical Exam  Constitutional: She appears well-developed and well-nourished.  Eyes: Conjunctivae are normal.  Cardiovascular: Normal rate, regular rhythm, normal heart sounds and normal pulses.  Pulmonary/Chest: Effort normal and breath sounds normal. She has no wheezes. She has no rhonchi. She has no rales.  Neurological: She is alert.  Skin: Skin is warm and dry.  Psychiatric: She has a normal mood and affect. Her speech is normal and behavior is normal. Thought content normal.  Vitals reviewed.      Assessment & Plan:   Problem List Items Addressed This Visit      Endocrine   Primary hyperparathyroidism [facility])    Following with Dr Gabriel Carina. Discussed with daughter and patient that memory complaints may also be a manifestation of this as well.         Musculoskeletal and Integument   Osteoporosis     Other   Leg swelling - Primary    Weight is stable. No obvious signs of fluid volume overload to suggest CHF however based on patients age, I advised cardiac consult. She does endorse SOB. Suspect deconditioning is contributory.  Pending BNP. She declines cardiology consult.  Discussed at length appropriate interventions for LE edema- elevation, compression stockings, low salt. Advised PRN lasix - patient will bring pill box to next appointment to see how she is taking. Advised of AE  electrolyte imbalance, dizziness, hypotension and the need for monitoring. Daughter verbalized understanding. Will continue to discuss when returns for med rec.      Relevant Orders   Comprehensive metabolic panel (Completed)   B Nat Peptide   Memory loss or impairment    Patient alert and well appearing today. 28/30 Mini mental. She declines consult with neurology today. Reassured as patient does not live alone and daughters are very involved. Concerned with polypharmacy, lack of knowledge regarding medications. Patient will return with medications for med rec with daughter in the next 1-2 weeks. Discussed complexity of memory concerns in context of depression, hyperparathyroidism,  hearing loss-- all of which may be contributory to either memory concerns or appearance thereof. Will follow.       Vitamin D deficiency   Relevant Orders   VITAMIN D 25 Hydroxy (Vit-D Deficiency, Fractures) (Completed)       I am having Darrelle M. Juleen China maintain her Cholecalciferol (VITAMIN D-3 PO), aspirin, loperamide, triamcinolone cream, vitamin B-12, omeprazole, buPROPion, escitalopram, ARIPiprazole, furosemide, and busPIRone.   No orders of the defined types were placed in this encounter.   Return precautions given.   Risks, benefits, and alternatives of the medications and treatment plan prescribed today were discussed, and patient expressed understanding.   Education regarding symptom management and diagnosis given to patient on AVS.  Continue to follow with Burnard Hawthorne, FNP for routine health maintenance.   [name] and I agreed with plan.   [name], FNP

## 2018-01-09 ENCOUNTER — Ambulatory Visit: Payer: Medicare Other | Admitting: Family

## 2018-01-09 ENCOUNTER — Encounter: Payer: Self-pay | Admitting: Family

## 2018-01-09 VITALS — BP 122/72 | HR 76 | Temp 98.3°F | Resp 16 | Wt 195.1 lb

## 2018-01-09 DIAGNOSIS — F0280 Dementia in other diseases classified elsewhere without behavioral disturbance: Secondary | ICD-10-CM | POA: Insufficient documentation

## 2018-01-09 DIAGNOSIS — R413 Other amnesia: Secondary | ICD-10-CM | POA: Diagnosis not present

## 2018-01-09 DIAGNOSIS — M7989 Other specified soft tissue disorders: Secondary | ICD-10-CM | POA: Diagnosis not present

## 2018-01-09 DIAGNOSIS — F02818 Dementia in other diseases classified elsewhere, unspecified severity, with other behavioral disturbance: Secondary | ICD-10-CM | POA: Insufficient documentation

## 2018-01-09 DIAGNOSIS — F0281 Dementia in other diseases classified elsewhere with behavioral disturbance: Secondary | ICD-10-CM | POA: Insufficient documentation

## 2018-01-09 DIAGNOSIS — E210 Primary hyperparathyroidism: Secondary | ICD-10-CM | POA: Diagnosis not present

## 2018-01-09 DIAGNOSIS — M810 Age-related osteoporosis without current pathological fracture: Secondary | ICD-10-CM

## 2018-01-09 DIAGNOSIS — E559 Vitamin D deficiency, unspecified: Secondary | ICD-10-CM

## 2018-01-09 LAB — COMPREHENSIVE METABOLIC PANEL
ALBUMIN: 4.2 g/dL (ref 3.5–5.2)
ALK PHOS: 60 U/L (ref 39–117)
ALT: 17 U/L (ref 0–35)
AST: 18 U/L (ref 0–37)
BUN: 13 mg/dL (ref 6–23)
CALCIUM: 10.3 mg/dL (ref 8.4–10.5)
CO2: 29 mEq/L (ref 19–32)
Chloride: 103 mEq/L (ref 96–112)
Creatinine, Ser: 1.03 mg/dL (ref 0.40–1.20)
GFR: 54.28 mL/min — AB (ref >60.00)
Glucose, Bld: 71 mg/dL (ref 70–99)
POTASSIUM: 3.5 meq/L (ref 3.5–5.1)
SODIUM: 141 meq/L (ref 135–145)
TOTAL PROTEIN: 7.4 g/dL (ref 6.0–8.3)
Total Bilirubin: 0.5 mg/dL (ref 0.2–1.2)

## 2018-01-09 LAB — VITAMIN D 25 HYDROXY (VIT D DEFICIENCY, FRACTURES): VITD: 32.63 ng/mL (ref 30.00–100.00)

## 2018-01-09 NOTE — Assessment & Plan Note (Signed)
Following with Dr Gabriel Carina. Discussed with daughter and patient that memory complaints may also be a manifestation of this as well.

## 2018-01-09 NOTE — Assessment & Plan Note (Addendum)
Patient alert and well appearing today. 28/30 Mini mental. She declines consult with neurology today. Reassured as patient does not live alone and daughters are very involved. Concerned with polypharmacy, lack of knowledge regarding medications. Patient will return with medications for med rec with daughter in the next 1-2 weeks. Discussed complexity of memory concerns in context of depression, hyperparathyroidism, hearing loss-- all of which may be contributory to either memory concerns or appearance thereof. Will follow.

## 2018-01-09 NOTE — Assessment & Plan Note (Addendum)
Weight is stable. No obvious signs of fluid volume overload to suggest CHF however based on patients age, I advised cardiac consult. She does endorse SOB. Suspect deconditioning is contributory.  Pending BNP. She declines cardiology consult.  Discussed at length appropriate interventions for LE edema- elevation, compression stockings, low salt. Advised PRN lasix - patient will bring pill box to next appointment to see how she is taking. Advised of AE electrolyte imbalance, dizziness, hypotension and the need for monitoring. Daughter verbalized understanding. Will continue to discuss when returns for med rec.

## 2018-01-09 NOTE — Patient Instructions (Addendum)
We need a med reconcilation appointment - please come next week or the following Labs today We will discuss cardiology, neurology at follow up

## 2018-01-13 ENCOUNTER — Telehealth: Payer: Self-pay | Admitting: Family

## 2018-01-13 DIAGNOSIS — R600 Localized edema: Secondary | ICD-10-CM

## 2018-01-13 NOTE — Telephone Encounter (Signed)
Call pt  How is leg swelling?  Please ensure they have made an appt for med rec There is also a lab that was unable to be collected , BNP, which is helpful if leg swelling has not improved, she would need to come in for this non fasting  Lab.

## 2018-01-15 NOTE — Telephone Encounter (Signed)
Called and spoke with [name] daughter she states patient has been having lower leg swelling for several months.  Right leg is worse than left .  No redness , wartmh, cuts, drainage, or oozing.  Patients doesn't want to wear compression stockings. Not monitoring weights. Will come in for BNP tomorrow.  Patient did have script for Lasix to use prn ,however she is now out.

## 2018-01-16 ENCOUNTER — Other Ambulatory Visit: Payer: Medicare Other

## 2018-01-16 MED ORDER — FUROSEMIDE 20 MG PO TABS: ORAL_TABLET | ORAL | 0 refills | Status: DC

## 2018-01-16 NOTE — Telephone Encounter (Signed)
Spoke to [place]. She said that she didn't bring her in today for blood work because she didn't want to bring her out in the rain. That she has already had an ultrasound done for this and that it should say it in the doctors notes. I told her that the last one I see was from 2017. Let me know what I need to do since she is hesitant to take her today.

## 2018-01-16 NOTE — Telephone Encounter (Signed)
Spoke to [place] , advised of below .  Explained importance of doing ultrasound on right lower leg.  She would like to go ahead and re-schedule scan for Monday if possible. Also advised her to monitor weights daily.   Let her know that Lasix was called in as prn.  [name] verbalized understanding.

## 2018-01-16 NOTE — Telephone Encounter (Signed)
kristen please Call jean-  During exam, it was described to me that the swelling was symmetric in both legs.  However if [name] has noticed that it is worse on one side versus the other, we would need to do a stat ultrasound to ensure no DVT.  I have ordered. This would need to be done today.   I Appreciate that there is no redness, warmth which might indicate infection.  She needs a follow-up appointment ASAP.    Again it is very important for her to monitor her daily weight and to wear compression stockings.  These things are essential if patient wants to improve her lower extremity swelling.  I have refilled her Lasix but this is to be used as needed.  I am not prescribing it as a daily medication at this time.  [name] needs to also understand that if she is can be a diuretic, we need to have frequent appointments, every 68months at least.  to monitor her labs as patient's electrolytes can be depleted when on diuretics.  MELISSA- see stat US.

## 2018-01-16 NOTE — Telephone Encounter (Signed)
Call pt [name] was in 2017 on right  While that is reassuring, I would advise to repeat since her mom is sedentary  Would she like vascular consult for LE swelling?

## 2018-01-19 ENCOUNTER — Ambulatory Visit
Admission: RE | Admit: 2018-01-19 | Discharge: 2018-01-19 | Disposition: A | Discharge status: Home / Self Care | Payer: Medicare Other | Source: Ambulatory Visit | Attending: Family | Admitting: Family

## 2018-01-19 ENCOUNTER — Other Ambulatory Visit (INDEPENDENT_AMBULATORY_CARE_PROVIDER_SITE_OTHER): Payer: Medicare Other

## 2018-01-19 ENCOUNTER — Other Ambulatory Visit: Payer: Self-pay | Admitting: Psychiatry

## 2018-01-19 DIAGNOSIS — M7989 Other specified soft tissue disorders: Secondary | ICD-10-CM | POA: Diagnosis not present

## 2018-01-19 DIAGNOSIS — Z872 Personal history of diseases of the skin and subcutaneous tissue: Secondary | ICD-10-CM | POA: Diagnosis not present

## 2018-01-19 DIAGNOSIS — R600 Localized edema: Secondary | ICD-10-CM | POA: Insufficient documentation

## 2018-01-19 LAB — BRAIN NATRIURETIC PEPTIDE: PRO B NATRI PEPTIDE: 27 pg/mL (ref 0.0–100.0)

## 2018-01-19 NOTE — Telephone Encounter (Signed)
Patient going today for ultrasound and coming in for labs

## 2018-01-23 ENCOUNTER — Ambulatory Visit: Payer: Medicare Other | Admitting: Family

## 2018-01-23 DIAGNOSIS — M7989 Other specified soft tissue disorders: Secondary | ICD-10-CM | POA: Diagnosis not present

## 2018-01-23 NOTE — Progress Notes (Signed)
Subjective:    Patient ID: [name], female    DOB: July 02, 1932, 84 y.o.   MRN: [id number]  CC: [name] is a 84 y.o. female who presents today for follow up.   HPI: Leg swelling unchanged; historically worse in right leg. Accompanied by daughter whom she llives with. [name] does the cooking, medication box for patient.   Compression stockings uncomfortable. She understands to use lasix prn.   No New concerns today.   No cough, CP  Daughter states mom is SOB with walking however notes she rarely walks and avoids it due to left knee pain which has been chronic.   Notes anxious when talking about health and being in doctors office. Doesn't want to see any specialists at this time.      HISTORY:  Past Medical History:  Diagnosis Date  . Anxiety   . Depression   . GERD (gastroesophageal reflux disease)   . Hypercalcemia   . Pneumonia   . Sepsis ([place])   . Sleep apnea   . SOB (shortness of breath) on exertion    Past Surgical History:  Procedure Laterality Date  . ENDOMETRIAL ABLATION    . EYE SURGERY  2011   left cataract  . KNEE ARTHROSCOPY Left 05/03/2015   Procedure: left knee arthroscopy, partial medial menisectomy, chondroplasty;  Surgeon: Dereck Leep, MD;  Location: [facility] ORS;  Service: Orthopedics;  Laterality: Left;  . TONSILLECTOMY     Family History  Problem Relation Age of Onset  . Cancer Mother 38       ovarian   . Pernicious anemia Mother   . COPD Father        nonsmoker  . Cancer Cousin        ovarian ca    Allergies: Codeine; Darvon [propoxyphene hcl]; and Propoxyphene Current Outpatient Medications on File Prior to Visit  Medication Sig Dispense Refill  . alendronate (FOSAMAX) 70 MG tablet TK 1 T PO Q 7 DAYS WITH A FULL GLASS OF WATER. DO NOT LIE DOWN FOR THE NEXT 30 MIN  3  . ARIPiprazole (ABILIFY) 2 MG tablet TAKE 1 TABLET(2 MG) BY MOUTH AT BEDTIME 90 tablet 2  . buPROPion (WELLBUTRIN) 75 MG tablet Take 1 tablet (75 mg total) by mouth  every morning. 90 tablet 2  . busPIRone (BUSPAR) 5 MG tablet TAKE 1 TABLET(5 MG) BY MOUTH TWICE DAILY 60 tablet 0  . Cholecalciferol (VITAMIN D-3 PO) Take 1 capsule by mouth daily. 500iu    . escitalopram (LEXAPRO) 10 MG tablet Take 1 tablet (10 mg total) by mouth daily. 90 tablet 2  . furosemide (LASIX) 20 MG tablet TAKE 1 TABLET(20 MG) BY MOUTH DAILY AS NEEDED 90 tablet 0  . loperamide (IMODIUM) 2 MG capsule Take 2 mg by mouth 2 (two) times daily.    Marland Kitchen omeprazole (PRILOSEC) 20 MG capsule Take 1 capsule (20 mg total) by mouth daily. 30 capsule 5  . triamcinolone cream (KENALOG) 0.1 % Apply topically 2 (two) times daily. 454 g 1  . vitamin B-12 (CYANOCOBALAMIN) 1000 MCG tablet Take by mouth.    Marland Kitchen aspirin 325 MG EC tablet Take 325 mg by mouth every 6 (six) hours as needed for pain.     No current facility-administered medications on file prior to visit.     Social History   Tobacco Use  . Smoking status: Never Smoker  . Smokeless tobacco: Never Used  Substance Use Topics  . Alcohol use: No  .  Drug use: No    Review of Systems  Constitutional: Negative for chills and fever.  Respiratory: Positive for shortness of breath (chronic). Negative for cough.   Cardiovascular: Positive for leg swelling (chronic, R>L). Negative for chest pain and palpitations.  Gastrointestinal: Negative for nausea and vomiting.  Psychiatric/Behavioral: The patient is nervous/anxious.       Objective:    There were no vitals taken for this visit. BP Readings from Last 3 Encounters:  01/09/18 122/72  09/18/17 110/64  08/21/17 110/74   Wt Readings from Last 3 Encounters:  01/09/18 195 lb 2 oz (88.5 kg)  09/18/17 196 lb (88.9 kg)  08/21/17 205 lb 6 oz (93.2 kg)    Physical Exam  Constitutional: She appears well-developed and well-nourished.  Eyes: Conjunctivae are normal.  Cardiovascular: Normal rate, regular rhythm, normal heart sounds and normal pulses.  RLE + 1 edema, non pitting. Trace pedal  edema noted on LLE.   No palpable cords or masses. No erythema or increased warmth. LE hair growth symmetric and present.    varicosities noted. LE warm and palpable pedal pulses.   Pulmonary/Chest: Effort normal and breath sounds normal. She has no wheezes. She has no rhonchi. She has no rales.  Neurological: She is alert.  Skin: Skin is warm and dry.  Psychiatric: She has a normal mood and affect. Her speech is normal and behavior is normal. Thought content normal.  Vitals reviewed.      Assessment & Plan:   Problem List Items Addressed This Visit      Other   Leg swelling    At baseline. Normal BNP and no evidence of an acute CHF exacerbation. Suspect a degree of HF based on 2017 CXR which shows cardiomegaly. Likely PVD contributory as well. Patient and daughter decline further work up at this time with cardiology consult such as echocardiogram or vascular consult at this time. They will follow up if any further concerns. I advised to return in 3-6 months however patient would prefer to come when needed as doctors offices make her anxious.           I am having [name] maintain her Cholecalciferol (VITAMIN D-3 PO), aspirin, loperamide, triamcinolone cream, vitamin B-12, omeprazole, buPROPion, escitalopram, ARIPiprazole, furosemide, busPIRone, and alendronate.   No orders of the defined types were placed in this encounter.   Return precautions given.   Risks, benefits, and alternatives of the medications and treatment plan prescribed today were discussed, and patient expressed understanding.   Education regarding symptom management and diagnosis given to patient on AVS.  Continue to follow with Burnard Hawthorne, FNP for routine health maintenance.   [name] and I agreed with plan.   [name], FNP

## 2018-01-23 NOTE — Patient Instructions (Signed)
You look great  Pleasure seeing you!   Peripheral Edema Peripheral edema is swelling that is caused by a buildup of fluid. Peripheral edema most often affects the lower legs, ankles, and feet. It can also develop in the arms, hands, and face. The area of the body that has peripheral edema will look swollen. It may also feel heavy or warm. Your clothes may start to feel tight. Pressing on the area may make a temporary dent in your skin. You may not be able to move your arm or leg as much as usual. There are many causes of peripheral edema. It can be a complication of other diseases, such as congestive heart failure, kidney disease, or a problem with your blood circulation. It also can be a side effect of certain medicines. It often happens to women during pregnancy. Sometimes, the cause is not known. Treating the underlying condition is often the only treatment for peripheral edema. Follow these instructions at home: Pay attention to any changes in your symptoms. Take these actions to help with your discomfort:  Raise (elevate) your legs while you are sitting or lying down.  Move around often to prevent stiffness and to lessen swelling. Do not sit or stand for long periods of time.  Wear support stockings as told by your health care provider.  Follow instructions from your health care provider about limiting salt (sodium) in your diet. Sometimes eating less salt can reduce swelling.  Take over-the-counter and prescription medicines only as told by your health care provider. Your health care provider may prescribe medicine to help your body get rid of excess water (diuretic).  Keep all follow-up visits as told by your health care provider. This is important.  Contact a health care provider if:  You have a fever.  Your edema starts suddenly or is getting worse, especially if you are pregnant or have a medical condition.  You have swelling in only one leg.  You have increased swelling and  pain in your legs. Get help right away if:  You develop shortness of breath, especially when you are lying down.  You have pain in your chest or abdomen.  You feel weak.  You faint. This information is not intended to replace advice given to you by your health care provider. Make sure you discuss any questions you have with your health care provider. Document Released: 09/05/2004 Document Revised: 01/01/2016 Document Reviewed: 02/08/2015 Elsevier Interactive Patient Education  [employer].

## 2018-01-23 NOTE — Assessment & Plan Note (Addendum)
At baseline. Normal BNP and no evidence of an acute CHF exacerbation. Suspect a degree of HF based on 2017 CXR which shows cardiomegaly. Likely PVD contributory as well. Patient and daughter decline further work up at this time with cardiology consult such as echocardiogram or vascular consult at this time. They will follow up if any further concerns. I advised to return in 3-6 months however patient would prefer to come when needed as doctors offices make her anxious.

## 2018-01-26 ENCOUNTER — Ambulatory Visit: Payer: No Typology Code available for payment source | Admitting: Psychiatry

## 2018-02-02 ENCOUNTER — Other Ambulatory Visit: Payer: Self-pay

## 2018-02-02 ENCOUNTER — Ambulatory Visit (INDEPENDENT_AMBULATORY_CARE_PROVIDER_SITE_OTHER): Payer: No Typology Code available for payment source | Admitting: Psychiatry

## 2018-02-02 ENCOUNTER — Encounter: Payer: Self-pay | Admitting: Psychiatry

## 2018-02-02 VITALS — BP 115/67 | HR 88 | Temp 98.8°F | Wt 196.4 lb

## 2018-02-02 DIAGNOSIS — F411 Generalized anxiety disorder: Secondary | ICD-10-CM | POA: Diagnosis not present

## 2018-02-02 DIAGNOSIS — F331 Major depressive disorder, recurrent, moderate: Secondary | ICD-10-CM | POA: Diagnosis not present

## 2018-02-02 DIAGNOSIS — F0390 Unspecified dementia without behavioral disturbance: Secondary | ICD-10-CM

## 2018-02-02 MED ORDER — ARIPIPRAZOLE 2 MG PO TABS: 2.0000 mg | ORAL_TABLET | Freq: Every day | ORAL | 2 refills | Status: DC

## 2018-02-02 MED ORDER — ESCITALOPRAM OXALATE 10 MG PO TABS: 10.0000 mg | ORAL_TABLET | Freq: Every day | ORAL | 2 refills | Status: DC

## 2018-02-02 MED ORDER — BUSPIRONE HCL 5 MG PO TABS: 5.0000 mg | ORAL_TABLET | Freq: Two times a day (BID) | ORAL | 1 refills | Status: DC

## 2018-02-02 NOTE — Progress Notes (Signed)
Psychiatric MD Progress Note  Patient Identification: [name] MRN:  [id number] Date of Evaluation:  02/02/2018 Referral Source: Ronette Deter, M.D  Chief Complaint:   Chief Complaint    Follow-up; Medication Refill     Visit Diagnosis:    ICD-10-CM   1. MDD (major depressive disorder), recurrent episode, moderate (HCC) F33.1   2. Anxiety state F41.1   3. Dementia arising in the senium and presenium F03.90    Diagnosis:   Patient Active Problem List   Diagnosis Date Noted  . Memory loss or impairment [R41.3] 01/09/2018  . Vitamin D deficiency [E55.9] 01/09/2018  . Dizziness [R42] 09/18/2017  . Leg cramps [R25.2] 09/18/2017  . Osteoporosis [M81.0] 09/18/2017  . CKD (chronic kidney disease) stage 3, GFR 30-59 ml/min (HCC) [N18.3] 09/18/2017  . Prediabetes [R73.03] 09/18/2017  . Hypercalcemia [E83.52] 08/24/2017  . Skin lesion [L98.9] 05/30/2016  . Leg swelling [M79.89] 05/30/2016  . Allergic rhinitis [J30.9] 12/03/2015  . UTI symptoms [R39.9] 08/30/2015  . Right flank pain [R10.9] 08/30/2015  . Hearing loss [H91.90] 06/26/2015  . Major depressive disorder with single episode [F32.9] 04/15/2015  . OSA (obstructive sleep apnea) [G47.33] 10/27/2014  . Left knee pain [M25.562] 10/27/2014  . Obstructive apnea [G47.33] 10/27/2014  . Urge incontinence of urine [N39.41] 04/22/2014  . Incomplete uterine prolapse [N81.2] 04/22/2014  . Cystocele [IMO0002] 12/29/2013  . Onychomycosis [B35.1] 08/09/2013  . Dermatophytic onychia [B35.1] 08/09/2013  . Primary hyperparathyroidism ([place]) [E21.0] 05/28/2013  . Medicare annual wellness visit, subsequent [Z00.00] 05/07/2013  . Stasis dermatitis [I87.2] 05/07/2013  . 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 Diarrhea [R19.7] 11/11/2012  . Closed fracture of humerus, supracondylar [S42.413A] 10/09/2012  . GERD (gastroesophageal reflux disease) [K21.9] 06/29/2012  . Acid reflux [K21.9] 06/29/2012  .  Depression [F32.9]    History of Present Illness:    Patient is a 84 year old female who presented  for follow-up accompanied by her daughter.Daughter reported that patient has decreased appetite and she has been feeling dizzy for the past couple of days..  Daughter reported that patient has been very picky in her food intake she does not eat well.  Patient reported that she does not like the food as she cannot taste it well.  She reported that she has been feeling dizzy but does not have any acute infection at this time.  We discussed about increased water intake and also decreasing the medications as she has been taking Wellbutrin which might be causing her poor appetite.  Her daughter agreed with the plan.  We discussed about different food choices at this time.  Patient currently denied having any suicidal homicidal ideations or plans.  Her daughter remains supportive.  Patient denied having any memory problems.  She sleeps well at night.      Her daughter is very supportive and has been taking care of her at this time.    She has been compliant with her medications. .        Past Medical History:  Past Medical History:  Diagnosis Date  . Anxiety   . Depression   . GERD (gastroesophageal reflux disease)   . Hypercalcemia   . Pneumonia   . Sepsis ([place])   . Sleep apnea   . SOB (shortness of breath) on exertion     Past Surgical History:  Procedure Laterality Date  . ENDOMETRIAL ABLATION    . EYE SURGERY  2011   left cataract  . KNEE ARTHROSCOPY Left 05/03/2015  Procedure: left knee arthroscopy, partial medial menisectomy, chondroplasty;  Surgeon: Dereck Leep, MD;  Location: [facility] ORS;  Service: Orthopedics;  Laterality: Left;  . TONSILLECTOMY     Family History:  Family History  Problem Relation Age of Onset  . Cancer Mother 38       ovarian   . Pernicious anemia Mother   . COPD Father        nonsmoker  . Cancer Cousin        ovarian ca   Social History:   Social  History   Socioeconomic History  . Marital status: Widowed    Spouse name: Not on file  . Number of children: 5  . Years of education: Not on file  . Highest education level: Not on file  Occupational History  . Occupation: retired  Scientific laboratory technician  . Financial resource strain: Somewhat hard  . Food insecurity:    Worry: Sometimes true    Inability: Sometimes true  . Transportation needs:    Medical: No    Non-medical: No  Tobacco Use  . Smoking status: Never Smoker  . Smokeless tobacco: Never Used  Substance and Sexual Activity  . Alcohol use: No  . Drug use: No  . Sexual activity: Not Currently  Lifestyle  . Physical activity:    Days per week: 0 days    Minutes per session: 0 min  . Stress: Rather much  Relationships  . Social connections:    Talks on phone: Once a week    Gets together: Once a week    Attends religious service: More than 4 times per year    Active member of club or organization: No    Attends meetings of clubs or organizations: Never    Relationship status: Widowed  Other Topics Concern  . Not on file  Social History Narrative   Regular exercise: not really   Caffeine use: some   Additional Social History:  She lives with her daughter.  Has 2 daughters and 2 sons. One daughter killed in car wreck. She was only married x 1. Divorced x 30 years.  Worked briefly for 5 years.    Musculoskeletal: Strength & Muscle Tone: decreased Gait & Station: unsteady Patient leans: N/A  Psychiatric Specialty Exam: Medication Refill     Review of Systems  Constitutional: Positive for malaise/fatigue.  Musculoskeletal: Positive for joint pain.  Psychiatric/Behavioral: Positive for depression and memory loss. The patient is nervous/anxious.     Blood pressure 115/67, pulse 88, temperature 98.8 F (37.1 C), temperature source Oral, weight 196 lb 6.4 oz (89.1 kg).Body mass index is 35.92 kg/m.  General Appearance: Casual  Eye Contact:  Fair  Speech:   Slow  Volume:  Decreased  Mood:  Anxious and Depressed  Affect:  Flat  Thought Process:  Loose  Orientation:  Full (Time, Place, and Person)  Thought Content:  WDL  Suicidal Thoughts:  No  Homicidal Thoughts:  No  Memory:  Immediate;   Fair  Judgement:  Fair  Insight:  Fair  Psychomotor Activity:  Psychomotor Retardation  Concentration:  Fair  Recall:  [employer] of Knowledge:Fair  Language: Fair  Akathisia:  No  Handed:  Right  AIMS (if indicated):    Assets:  Communication Skills Desire for Improvement Physical Health Social Support  ADL's:  Intact  Cognition: WNL  Sleep:  5-6   Is the patient at risk to self?  No. Has the patient been a risk to self in the  past 6 months?  No. Has the patient been a risk to self within the distant past?  No. Is the patient a risk to others?  No. Has the patient been a risk to others in the past 6 months?  No. Has the patient been a risk to others within the distant past?  No.  Allergies:   Allergies  Allergen Reactions  . Codeine Other (See Comments)    Crying spells  . Darvon [Propoxyphene Hcl] Other (See Comments)    Crying spells  . Propoxyphene Other (See Comments)    Crying, altered mental status   Current Medications: Current Outpatient Medications  Medication Sig Dispense Refill  . alendronate (FOSAMAX) 70 MG tablet TK 1 T PO Q 7 DAYS WITH A FULL GLASS OF WATER. DO NOT LIE DOWN FOR THE NEXT 30 MIN  3  . ARIPiprazole (ABILIFY) 2 MG tablet TAKE 1 TABLET(2 MG) BY MOUTH AT BEDTIME 90 tablet 2  . aspirin 325 MG EC tablet Take 325 mg by mouth every 6 (six) hours as needed for pain.    Marland Kitchen buPROPion (WELLBUTRIN) 75 MG tablet Take 1 tablet (75 mg total) by mouth every morning. 90 tablet 2  . busPIRone (BUSPAR) 5 MG tablet TAKE 1 TABLET(5 MG) BY MOUTH TWICE DAILY 60 tablet 0  . Cholecalciferol (VITAMIN D-3 PO) Take 1 capsule by mouth daily. 500iu    . escitalopram (LEXAPRO) 10 MG tablet Take 1 tablet (10 mg total) by mouth daily.  90 tablet 2  . furosemide (LASIX) 20 MG tablet TAKE 1 TABLET(20 MG) BY MOUTH DAILY AS NEEDED 90 tablet 0  . loperamide (IMODIUM) 2 MG capsule Take 2 mg by mouth 2 (two) times daily.    Marland Kitchen omeprazole (PRILOSEC) 20 MG capsule Take 1 capsule (20 mg total) by mouth daily. 30 capsule 5  . triamcinolone cream (KENALOG) 0.1 % Apply topically 2 (two) times daily. 454 g 1  . vitamin B-12 (CYANOCOBALAMIN) 1000 MCG tablet Take by mouth.     No current facility-administered medications for this visit.     Previous Psychotropic Medications:  Does not remember.  No psychiatric hospitalization.    Substance Abuse History in the last 12 months:  No.  Consequences of Substance Abuse: Negative NA  Medical Decision Making:  Review of Psycho-Social Stressors (1) and Review and summation of old records (2)  Treatment Plan Summary: Medication management   Discussed patient and her daughter about the medications treatment risks benefits and alternatives.  Patient will continue on the medications as follows D/c Wellbutrin 75 mg in the morning. She will continue on Lexapro 10 mg in the morning. Continue BuSpar 5 mg Bid  Abilify 2 mg in the evening    Discussed about the side effects in detail and she agreed with the plan  She will follow-up in 2 month or earlier depending on her symptoms   More than 50% of the time spent in psychoeducation, counseling and coordination of care.     This note was generated in part or whole with voice recognition software. Voice regonition is usually quite accurate but there are transcription errors that can and very often do occur. I apologize for any typographical errors that were not detected and corrected.     Rainey Pines, MD  6/24/20194:02 PM

## 2018-02-07 DIAGNOSIS — G4733 Obstructive sleep apnea (adult) (pediatric): Secondary | ICD-10-CM | POA: Diagnosis not present

## 2018-02-07 DIAGNOSIS — E669 Obesity, unspecified: Secondary | ICD-10-CM | POA: Diagnosis not present

## 2018-02-07 DIAGNOSIS — K219 Gastro-esophageal reflux disease without esophagitis: Secondary | ICD-10-CM | POA: Diagnosis not present

## 2018-02-07 DIAGNOSIS — F329 Major depressive disorder, single episode, unspecified: Secondary | ICD-10-CM | POA: Diagnosis not present

## 2018-02-10 ENCOUNTER — Telehealth: Payer: Self-pay | Admitting: *Deleted

## 2018-02-10 NOTE — Telephone Encounter (Signed)
Copied from [place] [id number]. Topic: Medicare AWV >> Feb 10, 2018  2:48 PM Vernona Rieger wrote: Reason for CRM: Patient's daughter called and wants her to have a medicare wellness visit

## 2018-02-11 NOTE — Telephone Encounter (Signed)
Gave information to Rasheedah to schedule Medicare Wellness visit.

## 2018-02-21 ENCOUNTER — Other Ambulatory Visit: Payer: Self-pay | Admitting: Psychiatry

## 2018-02-25 ENCOUNTER — Other Ambulatory Visit: Payer: Self-pay

## 2018-02-25 DIAGNOSIS — K219 Gastro-esophageal reflux disease without esophagitis: Secondary | ICD-10-CM

## 2018-02-25 MED ORDER — OMEPRAZOLE 20 MG PO CPDR: 20.0000 mg | DELAYED_RELEASE_CAPSULE | Freq: Every day | ORAL | 5 refills | Status: DC

## 2018-03-06 ENCOUNTER — Ambulatory Visit: Payer: Medicare Other | Admitting: Podiatry

## 2018-03-13 DIAGNOSIS — G4733 Obstructive sleep apnea (adult) (pediatric): Secondary | ICD-10-CM | POA: Diagnosis not present

## 2018-03-20 ENCOUNTER — Encounter: Payer: Self-pay | Admitting: Podiatry

## 2018-03-20 ENCOUNTER — Ambulatory Visit: Payer: Medicare Other | Admitting: Podiatry

## 2018-03-20 DIAGNOSIS — M79676 Pain in unspecified toe(s): Secondary | ICD-10-CM

## 2018-03-20 DIAGNOSIS — B351 Tinea unguium: Secondary | ICD-10-CM | POA: Diagnosis not present

## 2018-03-20 DIAGNOSIS — M79609 Pain in unspecified limb: Principal | ICD-10-CM

## 2018-03-23 NOTE — Progress Notes (Signed)
   SUBJECTIVE Patient presents to office today complaining of elongated, thickened nails that cause pain while ambulating in shoes. She is unable to trim her own nails. Patient is here for further evaluation and treatment.  Past Medical History:  Diagnosis Date  . Anxiety   . Depression   . GERD (gastroesophageal reflux disease)   . Hypercalcemia   . Pneumonia   . Sepsis (HCC)   . Sleep apnea   . SOB (shortness of breath) on exertion     OBJECTIVE General Patient is awake, alert, and oriented x 3 and in no acute distress. Derm Skin is dry and supple bilateral. Negative open lesions or macerations. Remaining integument unremarkable. Nails are tender, long, thickened and dystrophic with subungual debris, consistent with onychomycosis, 1-5 bilateral. No signs of infection noted. Vasc  DP and PT pedal pulses palpable bilaterally. Temperature gradient within normal limits.  Neuro Epicritic and protective threshold sensation grossly intact bilaterally.  Musculoskeletal Exam No symptomatic pedal deformities noted bilateral. Muscular strength within normal limits.  ASSESSMENT 1. Onychodystrophic nails 1-5 bilateral with hyperkeratosis of nails.  2. Onychomycosis of nail due to dermatophyte bilateral 3. Pain in foot bilateral  PLAN OF CARE 1. Patient evaluated today.  2. Instructed to maintain good pedal hygiene and foot care.  3. Mechanical debridement of nails 1-5 bilaterally performed using a nail nipper. Filed with dremel without incident.  4. Return to clinic in 3 mos.    Shawna Wearing M. Kerney Hopfensperger, DPM [facility]  Dr. Becky Colan M. Heatherly Stenner, DPM    [address]                                        [place], [place] [address]                Office [phone]  Fax [phone]     

## 2018-04-06 ENCOUNTER — Encounter: Payer: Self-pay | Admitting: Psychiatry

## 2018-04-06 ENCOUNTER — Ambulatory Visit (INDEPENDENT_AMBULATORY_CARE_PROVIDER_SITE_OTHER): Payer: No Typology Code available for payment source | Admitting: Psychiatry

## 2018-04-06 ENCOUNTER — Other Ambulatory Visit: Payer: Self-pay

## 2018-04-06 VITALS — BP 122/71 | HR 89 | Temp 99.2°F | Wt 198.4 lb

## 2018-04-06 DIAGNOSIS — F0390 Unspecified dementia without behavioral disturbance: Secondary | ICD-10-CM | POA: Diagnosis not present

## 2018-04-06 DIAGNOSIS — F331 Major depressive disorder, recurrent, moderate: Secondary | ICD-10-CM | POA: Diagnosis not present

## 2018-04-06 MED ORDER — ESCITALOPRAM OXALATE 10 MG PO TABS: 10.0000 mg | ORAL_TABLET | Freq: Every day | ORAL | 2 refills | Status: DC

## 2018-04-06 MED ORDER — ARIPIPRAZOLE 2 MG PO TABS: 2.0000 mg | ORAL_TABLET | Freq: Every day | ORAL | 2 refills | Status: DC

## 2018-04-06 MED ORDER — BUSPIRONE HCL 5 MG PO TABS: 5.0000 mg | ORAL_TABLET | Freq: Two times a day (BID) | ORAL | 1 refills | Status: DC

## 2018-04-06 NOTE — Progress Notes (Signed)
Psychiatric MD Progress Note  Patient Identification: [name] MRN:  [id number] Date of Evaluation:  04/06/2018 Referral Source: Ronette Deter, M.D  Chief Complaint:   Chief Complaint    Follow-up; Medication Refill     Visit Diagnosis:    ICD-10-CM   1. Dementia arising in the senium and presenium F03.90   2. MDD (major depressive disorder), recurrent episode, moderate (HCC) F33.1    Diagnosis:   Patient Active Problem List   Diagnosis Date Noted  . Memory loss or impairment [R41.3] 01/09/2018  . Vitamin D deficiency [E55.9] 01/09/2018  . Dizziness [R42] 09/18/2017  . Leg cramps [R25.2] 09/18/2017  . Osteoporosis [M81.0] 09/18/2017  . CKD (chronic kidney disease) stage 3, GFR 30-59 ml/min (HCC) [N18.3] 09/18/2017  . Prediabetes [R73.03] 09/18/2017  . Hypercalcemia [E83.52] 08/24/2017  . Skin lesion [L98.9] 05/30/2016  . Leg swelling [M79.89] 05/30/2016  . Allergic rhinitis [J30.9] 12/03/2015  . UTI symptoms [R39.9] 08/30/2015  . Right flank pain [R10.9] 08/30/2015  . Hearing loss [H91.90] 06/26/2015  . Major depressive disorder with single episode [F32.9] 04/15/2015  . OSA (obstructive sleep apnea) [G47.33] 10/27/2014  . Left knee pain [M25.562] 10/27/2014  . Obstructive apnea [G47.33] 10/27/2014  . Urge incontinence of urine [N39.41] 04/22/2014  . Incomplete uterine prolapse [N81.2] 04/22/2014  . Cystocele [IMO0002] 12/29/2013  . Onychomycosis [B35.1] 08/09/2013  . Dermatophytic onychia [B35.1] 08/09/2013  . Primary hyperparathyroidism ([place]) [E21.0] 05/28/2013  . Medicare annual wellness visit, subsequent [Z00.00] 05/07/2013  . Stasis dermatitis [I87.2] 05/07/2013  . 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 Diarrhea [R19.7] 11/11/2012  . Closed fracture of humerus, supracondylar [S42.413A] 10/09/2012  . GERD (gastroesophageal reflux disease) [K21.9] 06/29/2012  . Acid reflux [K21.9] 06/29/2012  . Depression [F32.9]     History of Present Illness:    Patient is a 84 year old female who presented  for follow-up accompanied by her daughter.Daughter fell and broke her left arm couple of months ago.  She reported that the patient was concerned about her and now she has help in the house so she can take care of her mother.  Pt been concerned about her daughter and daughter reported that at least she has been showing concerns about her as she was unable to take care of the patient.  Patient appeared calm and alert and her daughter reported that she has picked up some appetite since her bupropion has been discontinued.  She likes to eat cookies and ice cream.  She does not have any hallucinations and her memory remains stable.  Her daughter agreed with the current medications.  No acute issues noted at this time.  She was able to answer questions appropriately.  Her daughter remains supportive at this time.  Patient does not have any dizziness and she is getting help at home at this time.    Daughter remains supportive and she has hired and other help at the house to take care of the patient.   She has been compliant with her medications. .        Past Medical History:  Past Medical History:  Diagnosis Date  . Anxiety   . Depression   . GERD (gastroesophageal reflux disease)   . Hypercalcemia   . Pneumonia   . Sepsis ([place])   . Sleep apnea   . SOB (shortness of breath) on exertion     Past Surgical History:  Procedure Laterality Date  . ENDOMETRIAL ABLATION    . EYE SURGERY  2011  left cataract  . KNEE ARTHROSCOPY Left 05/03/2015   Procedure: left knee arthroscopy, partial medial menisectomy, chondroplasty;  Surgeon: Dereck Leep, MD;  Location: [facility] ORS;  Service: Orthopedics;  Laterality: Left;  . TONSILLECTOMY     Family History:  Family History  Problem Relation Age of Onset  . Cancer Mother 74       ovarian   . Pernicious anemia Mother   . COPD Father        nonsmoker  . Cancer Cousin         ovarian ca   Social History:   Social History   Socioeconomic History  . Marital status: Widowed    Spouse name: Not on file  . Number of children: 5  . Years of education: Not on file  . Highest education level: Not on file  Occupational History  . Occupation: retired  Scientific laboratory technician  . Financial resource strain: Somewhat hard  . Food insecurity:    Worry: Sometimes true    Inability: Sometimes true  . Transportation needs:    Medical: No    Non-medical: No  Tobacco Use  . Smoking status: Never Smoker  . Smokeless tobacco: Never Used  Substance and Sexual Activity  . Alcohol use: No  . Drug use: No  . Sexual activity: Not Currently  Lifestyle  . Physical activity:    Days per week: 0 days    Minutes per session: 0 min  . Stress: Rather much  Relationships  . Social connections:    Talks on phone: Once a week    Gets together: Once a week    Attends religious service: More than 4 times per year    Active member of club or organization: No    Attends meetings of clubs or organizations: Never    Relationship status: Widowed  Other Topics Concern  . Not on file  Social History Narrative   Regular exercise: not really   Caffeine use: some   Additional Social History:  She lives with her daughter.  Has 2 daughters and 2 sons. One daughter killed in car wreck. She was only married x 1. Divorced x 30 years.  Worked briefly for 5 years.    Musculoskeletal: Strength & Muscle Tone: decreased Gait & Station: unsteady Patient leans: N/A  Psychiatric Specialty Exam: Medication Refill     Review of Systems  Constitutional: Positive for malaise/fatigue.  Musculoskeletal: Positive for joint pain.  Psychiatric/Behavioral: Positive for depression and memory loss. The patient is nervous/anxious.     Blood pressure 122/71, pulse 89, temperature 99.2 F (37.3 C), temperature source Oral, weight 198 lb 6.4 oz (90 kg).Body mass index is 36.29 kg/m.  General  Appearance: Casual  Eye Contact:  Fair  Speech:  Slow  Volume:  Decreased  Mood:  Anxious and Depressed  Affect:  Flat  Thought Process:  Loose  Orientation:  Full (Time, Place, and Person)  Thought Content:  WDL  Suicidal Thoughts:  No  Homicidal Thoughts:  No  Memory:  Immediate;   Fair  Judgement:  Fair  Insight:  Fair  Psychomotor Activity:  Psychomotor Retardation  Concentration:  Fair  Recall:  [employer] of Knowledge:Fair  Language: Fair  Akathisia:  No  Handed:  Right  AIMS (if indicated):    Assets:  Communication Skills Desire for Improvement Physical Health Social Support  ADL's:  Intact  Cognition: WNL  Sleep:  5-6   Is the patient at risk to self?  No.  Has the patient been a risk to self in the past 6 months?  No. Has the patient been a risk to self within the distant past?  No. Is the patient a risk to others?  No. Has the patient been a risk to others in the past 6 months?  No. Has the patient been a risk to others within the distant past?  No.  Allergies:   Allergies  Allergen Reactions  . Codeine Other (See Comments)    Crying spells  . Darvon [Propoxyphene Hcl] Other (See Comments)    Crying spells  . Propoxyphene Other (See Comments)    Crying, altered mental status   Current Medications: Current Outpatient Medications  Medication Sig Dispense Refill  . alendronate (FOSAMAX) 70 MG tablet TK 1 T PO Q 7 DAYS WITH A FULL GLASS OF WATER. DO NOT LIE DOWN FOR THE NEXT 30 MIN  3  . ARIPiprazole (ABILIFY) 2 MG tablet Take 1 tablet (2 mg total) by mouth daily. 90 tablet 2  . aspirin 325 MG EC tablet Take 325 mg by mouth every 6 (six) hours as needed for pain.    . Cholecalciferol (VITAMIN D-3 PO) Take 1 capsule by mouth daily. 500iu    . escitalopram (LEXAPRO) 10 MG tablet Take 1 tablet (10 mg total) by mouth daily. 90 tablet 2  . furosemide (LASIX) 20 MG tablet TAKE 1 TABLET(20 MG) BY MOUTH DAILY AS NEEDED 90 tablet 0  . loperamide (IMODIUM) 2 MG  capsule Take 2 mg by mouth 2 (two) times daily.    Marland Kitchen omeprazole (PRILOSEC) 20 MG capsule Take 1 capsule (20 mg total) by mouth daily. 30 capsule 5  . triamcinolone cream (KENALOG) 0.1 % Apply topically 2 (two) times daily. 454 g 1  . vitamin B-12 (CYANOCOBALAMIN) 1000 MCG tablet Take by mouth.    . busPIRone (BUSPAR) 5 MG tablet Take 1 tablet (5 mg total) by mouth 2 (two) times daily. 120 tablet 1   No current facility-administered medications for this visit.     Previous Psychotropic Medications:  Does not remember.  No psychiatric hospitalization.    Substance Abuse History in the last 12 months:  No.  Consequences of Substance Abuse: Negative NA  Medical Decision Making:  Review of Psycho-Social Stressors (1) and Review and summation of old records (2)  Treatment Plan Summary: Medication management   Discussed patient and her daughter about the medications treatment risks benefits and alternatives.  Patient will continue on the medications as follows  She will continue on Lexapro 10 mg in the morning. Continue BuSpar 5 mg Bid  Abilify 2 mg in the evening    Discussed about the side effects in detail and she agreed with the plan  She will follow-up in 3 month or earlier depending on her symptoms   More than 50% of the time spent in psychoeducation, counseling and coordination of care.     This note was generated in part or whole with voice recognition software. Voice regonition is usually quite accurate but there are transcription errors that can and very often do occur. I apologize for any typographical errors that were not detected and corrected.     Rainey Pines, MD  8/26/201911:25 AM

## 2018-04-13 DIAGNOSIS — G4733 Obstructive sleep apnea (adult) (pediatric): Secondary | ICD-10-CM | POA: Diagnosis not present

## 2018-04-18 DIAGNOSIS — N390 Urinary tract infection, site not specified: Secondary | ICD-10-CM | POA: Diagnosis not present

## 2018-05-04 DIAGNOSIS — G4733 Obstructive sleep apnea (adult) (pediatric): Secondary | ICD-10-CM | POA: Diagnosis not present

## 2018-05-04 DIAGNOSIS — E669 Obesity, unspecified: Secondary | ICD-10-CM | POA: Diagnosis not present

## 2018-05-04 DIAGNOSIS — F341 Dysthymic disorder: Secondary | ICD-10-CM | POA: Diagnosis not present

## 2018-05-04 DIAGNOSIS — K219 Gastro-esophageal reflux disease without esophagitis: Secondary | ICD-10-CM | POA: Diagnosis not present

## 2018-05-11 DIAGNOSIS — G4733 Obstructive sleep apnea (adult) (pediatric): Secondary | ICD-10-CM | POA: Diagnosis not present

## 2018-05-13 DIAGNOSIS — G4733 Obstructive sleep apnea (adult) (pediatric): Secondary | ICD-10-CM | POA: Diagnosis not present

## 2018-06-12 ENCOUNTER — Ambulatory Visit (INDEPENDENT_AMBULATORY_CARE_PROVIDER_SITE_OTHER): Payer: Medicare Other | Admitting: Lab

## 2018-06-12 DIAGNOSIS — Z23 Encounter for immunization: Secondary | ICD-10-CM | POA: Diagnosis not present

## 2018-06-12 NOTE — Progress Notes (Signed)
Pt here for high dose flu shot tolerated well

## 2018-06-13 DIAGNOSIS — G4733 Obstructive sleep apnea (adult) (pediatric): Secondary | ICD-10-CM | POA: Diagnosis not present

## 2018-06-19 ENCOUNTER — Ambulatory Visit: Payer: Medicare Other | Admitting: Podiatry

## 2018-06-19 ENCOUNTER — Encounter: Payer: Self-pay | Admitting: Podiatry

## 2018-06-19 DIAGNOSIS — M79609 Pain in unspecified limb: Secondary | ICD-10-CM | POA: Diagnosis not present

## 2018-06-19 DIAGNOSIS — B351 Tinea unguium: Secondary | ICD-10-CM | POA: Diagnosis not present

## 2018-06-21 NOTE — Progress Notes (Signed)
   SUBJECTIVE Patient presents to office today complaining of elongated, thickened nails that cause pain while ambulating in shoes. She is unable to trim her own nails. Patient is here for further evaluation and treatment.  Past Medical History:  Diagnosis Date  . Anxiety   . Depression   . GERD (gastroesophageal reflux disease)   . Hypercalcemia   . Pneumonia   . Sepsis (HCC)   . Sleep apnea   . SOB (shortness of breath) on exertion     OBJECTIVE General Patient is awake, alert, and oriented x 3 and in no acute distress. Derm Skin is dry and supple bilateral. Negative open lesions or macerations. Remaining integument unremarkable. Nails are tender, long, thickened and dystrophic with subungual debris, consistent with onychomycosis, 1-5 bilateral. No signs of infection noted. Vasc  DP and PT pedal pulses palpable bilaterally. Temperature gradient within normal limits.  Neuro Epicritic and protective threshold sensation grossly intact bilaterally.  Musculoskeletal Exam No symptomatic pedal deformities noted bilateral. Muscular strength within normal limits.  ASSESSMENT 1. Onychodystrophic nails 1-5 bilateral with hyperkeratosis of nails.  2. Onychomycosis of nail due to dermatophyte bilateral 3. Pain in foot bilateral  PLAN OF CARE 1. Patient evaluated today.  2. Instructed to maintain good pedal hygiene and foot care.  3. Mechanical debridement of nails 1-5 bilaterally performed using a nail nipper. Filed with dremel without incident.  4. Return to clinic in 3 mos.    Alizon Schmeling M. Montgomery Favor, DPM [facility]  Dr. Hatsue Sime M. Nery Kalisz, DPM    [address]                                        [place], [place] [address]                Office [phone]  Fax [phone]     

## 2018-06-26 ENCOUNTER — Ambulatory Visit (INDEPENDENT_AMBULATORY_CARE_PROVIDER_SITE_OTHER): Payer: Medicare Other

## 2018-06-26 VITALS — BP 118/70 | HR 80 | Temp 98.1°F | Resp 16 | Ht 63.0 in | Wt 194.8 lb

## 2018-06-26 DIAGNOSIS — Z0000 Encounter for general adult medical examination without abnormal findings: Secondary | ICD-10-CM | POA: Diagnosis not present

## 2018-06-26 NOTE — Progress Notes (Signed)
Subjective:   [name] is a 84 y.o. female who presents for Medicare Annual (Subsequent) preventive examination.  Review of Systems:  No ROS.  Medicare Wellness Visit. Additional risk factors are reflected in the social history. Cardiac Risk Factors include: advanced age (>16men, >78 women);obesity (BMI >30kg/m2)     Objective:     Vitals: BP 118/70 (BP Location: Right Arm, Patient Position: Sitting, Cuff Size: Normal)   Pulse 80   Temp 98.1 F (36.7 C) (Oral)   Resp 16   Ht 5\' 3"  (1.6 m)   Wt 194 lb 12.8 oz (88.4 kg)   SpO2 95%   BMI 34.51 kg/m   Body mass index is 34.51 kg/m.  Advanced Directives 06/26/2018 04/21/2015  Does Patient Have a Medical Advance Directive? Yes Yes  Type of Paramedic of [place];Living will [place];Living will  Does patient want to make changes to medical advance directive? No - Patient declined -  Copy of [place] in Chart? Yes - validated most recent copy scanned in chart (See row information) -  Some encounter information is confidential and restricted. Go to Review Flowsheets activity to see all data.    Tobacco Social History   Tobacco Use  Smoking Status Never Smoker  Smokeless Tobacco Never Used     Counseling given: Not Answered   Clinical Intake:  Pre-visit preparation completed: Yes  Pain : No/denies pain     Nutritional Status: BMI > 30  Obese Diabetes: No  How often do you need to have someone help you when you read instructions, pamphlets, or other written materials from your doctor or pharmacy?: 5 - Always        Past Medical History:  Diagnosis Date  . Anxiety   . Depression   . GERD (gastroesophageal reflux disease)   . Hypercalcemia   . Pneumonia   . Sepsis ([place])   . Sleep apnea   . SOB (shortness of breath) on exertion    Past Surgical History:  Procedure Laterality Date  . ENDOMETRIAL ABLATION    . EYE SURGERY  2011   left  cataract  . KNEE ARTHROSCOPY Left 05/03/2015   Procedure: left knee arthroscopy, partial medial menisectomy, chondroplasty;  Surgeon: Dereck Leep, MD;  Location: [facility] ORS;  Service: Orthopedics;  Laterality: Left;  . TONSILLECTOMY     Family History  Problem Relation Age of Onset  . Cancer Mother 29       ovarian   . Pernicious anemia Mother   . COPD Father        nonsmoker  . Cancer Cousin        ovarian ca   Social History   Socioeconomic History  . Marital status: Widowed    Spouse name: Not on file  . Number of children: 5  . Years of education: Not on file  . Highest education level: Not on file  Occupational History  . Occupation: retired  Scientific laboratory technician  . Financial resource strain: Somewhat hard  . Food insecurity:    Worry: Sometimes true    Inability: Sometimes true  . Transportation needs:    Medical: No    Non-medical: No  Tobacco Use  . Smoking status: Never Smoker  . Smokeless tobacco: Never Used  Substance and Sexual Activity  . Alcohol use: No  . Drug use: No  . Sexual activity: Not Currently  Lifestyle  . Physical activity:    Days per week:  0 days    Minutes per session: 0 min  . Stress: Rather much  Relationships  . Social connections:    Talks on phone: Once a week    Gets together: Once a week    Attends religious service: More than 4 times per year    Active member of club or organization: No    Attends meetings of clubs or organizations: Never    Relationship status: Widowed  Other Topics Concern  . Not on file  Social History Narrative   Regular exercise: not really   Caffeine use: some    Outpatient Encounter Medications as of 06/26/2018  Medication Sig  . alendronate (FOSAMAX) 70 MG tablet TK 1 T PO Q 7 DAYS WITH A FULL GLASS OF WATER. DO NOT LIE DOWN FOR THE NEXT 30 MIN  . ARIPiprazole (ABILIFY) 2 MG tablet Take 1 tablet (2 mg total) by mouth daily.  Marland Kitchen aspirin 325 MG EC tablet Take 325 mg by mouth every 6 (six) hours as needed  for pain.  . busPIRone (BUSPAR) 5 MG tablet Take 1 tablet (5 mg total) by mouth 2 (two) times daily.  . Cholecalciferol (VITAMIN D-3 PO) Take 1 capsule by mouth daily. 500iu  . escitalopram (LEXAPRO) 10 MG tablet Take 1 tablet (10 mg total) by mouth daily.  . furosemide (LASIX) 20 MG tablet TAKE 1 TABLET(20 MG) BY MOUTH DAILY AS NEEDED  . loperamide (IMODIUM) 2 MG capsule Take 2 mg by mouth 2 (two) times daily.  Marland Kitchen omeprazole (PRILOSEC) 20 MG capsule Take 1 capsule (20 mg total) by mouth daily.  Marland Kitchen triamcinolone cream (KENALOG) 0.1 % Apply topically 2 (two) times daily.  . vitamin B-12 (CYANOCOBALAMIN) 1000 MCG tablet Take by mouth.  . [DISCONTINUED] nitrofurantoin, macrocrystal-monohydrate, (MACROBID) 100 MG capsule TK ONE C PO BID   No facility-administered encounter medications on file as of 06/26/2018.     Activities of Daily Living In your present state of health, do you have any difficulty performing the following activities: 06/26/2018  Hearing? Y  Comment Hearing aid, bilateral  Vision? N  Difficulty concentrating or making decisions? Y  Comment Dx of Memory loss/impairment  Walking or climbing stairs? Y  Comment Walker in use.  Unsteady gait.   Dressing or bathing? Y  Comment Daughter assists  Doing errands, shopping? Y  Comment She does not drive.   Preparing Food and eating ? Y  Comment She does not cool. Self feeds.   Using the Toilet? Y  Comment Daughter assists as needed. Brief in use daily.   In the past six months, have you accidently leaked urine? Y  Do you have problems with loss of bowel control? N  Managing your Medications? Y  Comment Daughter manages.  Managing your Finances? Y  Comment Daughter manages.   Housekeeping or managing your Housekeeping? Y  Comment Daughter manages.   Some recent data might be hidden    Patient Care Team: Burnard Hawthorne, FNP as PCP - General (Family Medicine)    Assessment:   This is a routine wellness examination for  [name].  The goal of the wellness visit is to assist the patient how to close the gaps in care and create a preventative care plan for the patient.   The roster of all physicians providing medical care to patient is listed in the Snapshot section of the chart.   Presents with daughter, HIPAA compliant.  Information obtained from both. States she has the beginning of a cold  today; declines follow up appointment with doctor.  No fever.  Vital signs wnl.   Taking calcium VIT D as appropriate/Osteoporosis  reviewed.    Safety issues reviewed; Life alert, smoke and carbon monoxide detectors in the home. No firearms in the home. Wears seatbelts when riding with others. No violence in the home.  They do not have excessive sun exposure.  Discussed the need for sun protection: hats, long sleeves and the use of sunscreen if there is significant sun exposure.  Depression- PHQ 2 &9 complete.  Exempt. Followed by psychiatry.   No new identified risk were noted.  No failures at ADL's or IADL's.  Ambulates with walker.   BMI- discussed the importance of a healthy diet, water intake and the benefits of aerobic exercise. Seated strengthening exercises encouraged.   Appetite- varies from day to day. Daughter tries to encourage healthy foods and Boost/Ensure but not always successful. States,"Mom takes a few bites of a healthy soft diet that I prepare then pushes it to the side.  Most mornings she will have a pop tart and a cup of coffee.  That may be all." Encouraged to continue with Boost/Ensure as a milkshake or add to cereal.  . Dental- dentures.   Sleep patterns- Sleeps well. CPAP in use.   TDAP vaccine deferred per patient preference.  Follow up with insurance.  Educational material provided.  Patient Concerns: None at this time. Follow up with PCP as needed.  Exercise Activities and Dietary recommendations Current Exercise Habits: The patient does not participate in regular exercise at  present  Goals    . Do not skip meals     Add supplemental Boost/Ensure protein  Eat 3 meals daily       Fall Risk Fall Risk  06/26/2018 09/18/2017 05/30/2016 06/26/2015 10/27/2014  Falls in the past year? 0 No No No No  Number falls in past yr: - - - - -  Injury with Fall? - - - - -  Risk for fall due to : - - - - -   Depression Screen PHQ 2/9 Scores 06/26/2018 09/18/2017 05/30/2016 06/26/2015  PHQ - 2 Score - 0 0 0  Exception Documentation Other- indicate reason in comment box - - -     Cognitive Function MMSE - Mini Mental State Exam 10/09/2015  Registration-comments 3  Some encounter information is confidential and restricted. Go to Review Flowsheets activity to see all data.     6CIT Screen 06/26/2018  What Year? 0 points  What month? 0 points  What time? 0 points  Count back from 20 0 points  Months in reverse 0 points  Repeat phrase 0 points  Total Score 0    Immunization History  Administered Date(s) Administered  . Influenza Split 06/24/2014, 06/12/2015  . Influenza, High Dose Seasonal PF 05/30/2016, 05/09/2017, 06/12/2018  . Influenza,inj,Quad PF,6+ Mos 05/07/2013  . Influenza-Unspecified 05/09/2017  . Pneumococcal Conjugate-13 06/26/2015  . Pneumococcal Polysaccharide-23 12/28/2010, 11/02/2012   Screening Tests Health Maintenance  Topic Date Due  . TETANUS/TDAP  03/14/1951  . INFLUENZA VACCINE  Completed  . DEXA SCAN  Completed  . PNA vac Low Risk Adult  Completed      Plan:    End of life planning; Advance aging; Advanced directives discussed. Copy of current HCPOA/Living Will requested.    I have personally reviewed and noted the following in the patient's chart:   . Medical and social history . Use of alcohol, tobacco or illicit drugs  . Current  medications and supplements . Functional ability and status . Nutritional status . Physical activity . Advanced directives . List of other physicians . Hospitalizations, surgeries, and ER visits  in previous 12 months . Vitals . Screenings to include cognitive, depression, and falls . Referrals and appointments  In addition, I have reviewed and discussed with patient certain preventive protocols, quality metrics, and best practice recommendations. A written personalized care plan for preventive services as well as general preventive health recommendations were provided to patient.     Varney Biles, LPN  20/35/5974

## 2018-06-26 NOTE — Patient Instructions (Addendum)
  [name] , Thank you for taking time to come for your Medicare Wellness Visit. I appreciate your ongoing commitment to your health goals. Please review the following plan we discussed and let me know if I can assist you in the future.   These are the goals we discussed: Goals    . Do not skip meals     Add supplemental Boost/Ensure protein  Eat 3 meals daily       This is a list of the screening recommended for you and due dates:  Health Maintenance  Topic Date Due  . Tetanus Vaccine  03/14/1951  . Flu Shot  Completed  . DEXA scan (bone density measurement)  Completed  . Pneumonia vaccines  Completed

## 2018-06-26 NOTE — Progress Notes (Signed)
Reviewed. No concerns at this time. F/u with PCP

## 2018-06-29 ENCOUNTER — Encounter: Payer: Self-pay | Admitting: Psychiatry

## 2018-06-29 ENCOUNTER — Ambulatory Visit (INDEPENDENT_AMBULATORY_CARE_PROVIDER_SITE_OTHER): Payer: No Typology Code available for payment source | Admitting: Psychiatry

## 2018-06-29 VITALS — BP 144/76 | HR 82 | Ht 63.0 in | Wt 194.0 lb

## 2018-06-29 DIAGNOSIS — F0390 Unspecified dementia without behavioral disturbance: Secondary | ICD-10-CM | POA: Diagnosis not present

## 2018-06-29 DIAGNOSIS — F331 Major depressive disorder, recurrent, moderate: Secondary | ICD-10-CM | POA: Diagnosis not present

## 2018-06-29 MED ORDER — BUSPIRONE HCL 5 MG PO TABS: 5.0000 mg | ORAL_TABLET | Freq: Two times a day (BID) | ORAL | 1 refills | Status: DC

## 2018-06-29 MED ORDER — ESCITALOPRAM OXALATE 10 MG PO TABS: 10.0000 mg | ORAL_TABLET | Freq: Every day | ORAL | 2 refills | Status: DC

## 2018-06-29 MED ORDER — ARIPIPRAZOLE 2 MG PO TABS: 2.0000 mg | ORAL_TABLET | Freq: Every day | ORAL | 2 refills | Status: DC

## 2018-06-29 NOTE — Progress Notes (Signed)
Psychiatric MD Progress Note  Patient Identification: [name] MRN:  [id number] Date of Evaluation:  06/29/2018 Referral Source: Ronette Deter, M.D  Chief Complaint:    Visit Diagnosis:    ICD-10-CM   1. Dementia arising in the senium and presenium ([place]) F03.90   2. MDD (major depressive disorder), recurrent episode, moderate (HCC) F33.1    Diagnosis:   Patient Active Problem List   Diagnosis Date Noted  . Memory loss or impairment [R41.3] 01/09/2018  . Vitamin D deficiency [E55.9] 01/09/2018  . Dizziness [R42] 09/18/2017  . Leg cramps [R25.2] 09/18/2017  . Osteoporosis [M81.0] 09/18/2017  . CKD (chronic kidney disease) stage 3, GFR 30-59 ml/min (HCC) [N18.3] 09/18/2017  . Prediabetes [R73.03] 09/18/2017  . Hypercalcemia [E83.52] 08/24/2017  . Skin lesion [L98.9] 05/30/2016  . Leg swelling [M79.89] 05/30/2016  . Allergic rhinitis [J30.9] 12/03/2015  . UTI symptoms [R39.9] 08/30/2015  . Right flank pain [R10.9] 08/30/2015  . Hearing loss [H91.90] 06/26/2015  . Major depressive disorder with single episode [F32.9] 04/15/2015  . OSA (obstructive sleep apnea) [G47.33] 10/27/2014  . Left knee pain [M25.562] 10/27/2014  . Obstructive apnea [G47.33] 10/27/2014  . Urge incontinence of urine [N39.41] 04/22/2014  . Incomplete uterine prolapse [N81.2] 04/22/2014  . Cystocele [IMO0002] 12/29/2013  . Onychomycosis [B35.1] 08/09/2013  . Dermatophytic onychia [B35.1] 08/09/2013  . Primary hyperparathyroidism ([place]) [E21.0] 05/28/2013  . Medicare annual wellness visit, subsequent [Z00.00] 05/07/2013  . Stasis dermatitis [I87.2] 05/07/2013  . 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 Diarrhea [R19.7] 11/11/2012  . Closed fracture of humerus, supracondylar [S42.413A] 10/09/2012  . GERD (gastroesophageal reflux disease) [K21.9] 06/29/2012  . Acid reflux [K21.9] 06/29/2012  . Depression [F32.9]    History of Present Illness:    Patient is a  84 year old female who presented  for follow-up accompanied by her daughter. Daughter reported that patient has been recuperating upper respiratory infection last week.  She is doing well now.  She is more calm and alert since she has been started on her medications and has been compliant with them.  She sleeps well at night.  Daughter stated that she has noticed that she is becoming more alert and is able to answer questions.  She is becoming more energetic and her symptoms are also improving.  No acute symptoms noted at this time.  Patient was also able to answer appropriately during the interview.  She can take care of herself and has been getting help from the daughter who has been living with her.  Occasionally she gets anxious about her other children and question to her daughter about them   They are planning to stay at the home for the holidays.  No other behavioral problems noted at this time.  She denied having any suicidal homicidal ideations or plans.  No other perceptual disturbances noted.  She is compliant with her medications.    Daughter remains supportive and she has hired and other help at the house to take care of the patient.   She has been compliant with her medications. .        Past Medical History:  Past Medical History:  Diagnosis Date  . Anxiety   . Depression   . GERD (gastroesophageal reflux disease)   . Hypercalcemia   . Pneumonia   . Sepsis ([place])   . Sleep apnea   . SOB (shortness of breath) on exertion     Past Surgical History:  Procedure Laterality Date  . ENDOMETRIAL ABLATION    .  EYE SURGERY  2011   left cataract  . KNEE ARTHROSCOPY Left 05/03/2015   Procedure: left knee arthroscopy, partial medial menisectomy, chondroplasty;  Surgeon: Dereck Leep, MD;  Location: [facility] ORS;  Service: Orthopedics;  Laterality: Left;  . TONSILLECTOMY     Family History:  Family History  Problem Relation Age of Onset  . Cancer Mother 38       ovarian   .  Pernicious anemia Mother   . COPD Father        nonsmoker  . Cancer Cousin        ovarian ca   Social History:   Social History   Socioeconomic History  . Marital status: Widowed    Spouse name: Not on file  . Number of children: 5  . Years of education: Not on file  . Highest education level: Not on file  Occupational History  . Occupation: retired  Scientific laboratory technician  . Financial resource strain: Somewhat hard  . Food insecurity:    Worry: Sometimes true    Inability: Sometimes true  . Transportation needs:    Medical: No    Non-medical: No  Tobacco Use  . Smoking status: Never Smoker  . Smokeless tobacco: Never Used  Substance and Sexual Activity  . Alcohol use: No  . Drug use: No  . Sexual activity: Not Currently  Lifestyle  . Physical activity:    Days per week: 0 days    Minutes per session: 0 min  . Stress: Rather much  Relationships  . Social connections:    Talks on phone: Once a week    Gets together: Once a week    Attends religious service: More than 4 times per year    Active member of club or organization: No    Attends meetings of clubs or organizations: Never    Relationship status: Widowed  Other Topics Concern  . Not on file  Social History Narrative   Regular exercise: not really   Caffeine use: some   Additional Social History:  She lives with her daughter.  Has 2 daughters and 2 sons. One daughter killed in car wreck. She was only married x 1. Divorced x 30 years.  Worked briefly for 5 years.    Musculoskeletal: Strength & Muscle Tone: decreased Gait & Station: unsteady Patient leans: N/A  Psychiatric Specialty Exam: Medication Refill     Review of Systems  Constitutional: Positive for malaise/fatigue.  Musculoskeletal: Positive for joint pain.  Psychiatric/Behavioral: Positive for depression and memory loss. The patient is nervous/anxious.     Blood pressure (!) 144/76, pulse 82, height 5\' 3"  (1.6 m), weight 194 lb (88 kg), SpO2  95 %.Body mass index is 34.37 kg/m.  General Appearance: Casual  Eye Contact:  Fair  Speech:  Slow  Volume:  Decreased  Mood:  Anxious and Depressed  Affect:  Flat  Thought Process:  Loose  Orientation:  Full (Time, Place, and Person)  Thought Content:  WDL  Suicidal Thoughts:  No  Homicidal Thoughts:  No  Memory:  Immediate;   Fair  Judgement:  Fair  Insight:  Fair  Psychomotor Activity:  Psychomotor Retardation  Concentration:  Fair  Recall:  [employer] of Knowledge:Fair  Language: Fair  Akathisia:  No  Handed:  Right  AIMS (if indicated):    Assets:  Communication Skills Desire for Improvement Physical Health Social Support  ADL's:  Intact  Cognition: WNL  Sleep:  5-6   Is the patient at  risk to self?  No. Has the patient been a risk to self in the past 6 months?  No. Has the patient been a risk to self within the distant past?  No. Is the patient a risk to others?  No. Has the patient been a risk to others in the past 6 months?  No. Has the patient been a risk to others within the distant past?  No.  Allergies:   Allergies  Allergen Reactions  . Codeine Other (See Comments)    Crying spells  . Darvon [Propoxyphene Hcl] Other (See Comments)    Crying spells  . Propoxyphene Other (See Comments)    Crying, altered mental status   Current Medications: Current Outpatient Medications  Medication Sig Dispense Refill  . alendronate (FOSAMAX) 70 MG tablet TK 1 T PO Q 7 DAYS WITH A FULL GLASS OF WATER. DO NOT LIE DOWN FOR THE NEXT 30 MIN  3  . ARIPiprazole (ABILIFY) 2 MG tablet Take 1 tablet (2 mg total) by mouth daily. 90 tablet 2  . aspirin 325 MG EC tablet Take 325 mg by mouth every 6 (six) hours as needed for pain.    . busPIRone (BUSPAR) 5 MG tablet Take 1 tablet (5 mg total) by mouth 2 (two) times daily. 120 tablet 1  . Cholecalciferol (VITAMIN D-3 PO) Take 1 capsule by mouth daily. 500iu    . escitalopram (LEXAPRO) 10 MG tablet Take 1 tablet (10 mg total) by  mouth daily. 90 tablet 2  . furosemide (LASIX) 20 MG tablet TAKE 1 TABLET(20 MG) BY MOUTH DAILY AS NEEDED 90 tablet 0  . loperamide (IMODIUM) 2 MG capsule Take 2 mg by mouth 2 (two) times daily.    Marland Kitchen omeprazole (PRILOSEC) 20 MG capsule Take 1 capsule (20 mg total) by mouth daily. 30 capsule 5  . triamcinolone cream (KENALOG) 0.1 % Apply topically 2 (two) times daily. 454 g 1  . vitamin B-12 (CYANOCOBALAMIN) 1000 MCG tablet Take by mouth.     No current facility-administered medications for this visit.     Previous Psychotropic Medications:  Does not remember.  No psychiatric hospitalization.    Substance Abuse History in the last 12 months:  No.  Consequences of Substance Abuse: Negative NA  Medical Decision Making:  Review of Psycho-Social Stressors (1) and Review and summation of old records (2)  Treatment Plan Summary: Medication management   Discussed patient and her daughter about the medications treatment risks benefits and alternatives.  Patient will continue on the medications as follows  She will continue on Lexapro 10 mg in the morning. Continue BuSpar 5 mg Bid  Abilify 2 mg in the evening    Discussed about the side effects in detail and she agreed with the plan  She will follow-up in 3 month or earlier depending on her symptoms   More than 50% of the time spent in psychoeducation, counseling and coordination of care.     This note was generated in part or whole with voice recognition software. Voice regonition is usually quite accurate but there are transcription errors that can and very often do occur. I apologize for any typographical errors that were not detected and corrected.     Rainey Pines, MD  11/18/201911:54 AM

## 2018-07-06 ENCOUNTER — Ambulatory Visit: Payer: Self-pay | Admitting: Psychiatry

## 2018-09-15 ENCOUNTER — Ambulatory Visit: Payer: Medicare Other | Admitting: Podiatry

## 2018-09-18 ENCOUNTER — Ambulatory Visit: Payer: Medicare Other | Admitting: Podiatry

## 2018-10-02 ENCOUNTER — Ambulatory Visit: Payer: Medicare Other | Admitting: Podiatry

## 2018-10-03 ENCOUNTER — Other Ambulatory Visit: Payer: Self-pay | Admitting: Family

## 2018-10-03 DIAGNOSIS — K219 Gastro-esophageal reflux disease without esophagitis: Secondary | ICD-10-CM

## 2018-10-09 ENCOUNTER — Ambulatory Visit: Payer: Medicare Other | Admitting: Podiatry

## 2018-10-16 ENCOUNTER — Encounter: Payer: Self-pay | Admitting: Podiatry

## 2018-10-16 ENCOUNTER — Ambulatory Visit (INDEPENDENT_AMBULATORY_CARE_PROVIDER_SITE_OTHER): Payer: Medicare Other | Admitting: Podiatry

## 2018-10-16 DIAGNOSIS — B351 Tinea unguium: Secondary | ICD-10-CM

## 2018-10-16 DIAGNOSIS — M79676 Pain in unspecified toe(s): Secondary | ICD-10-CM

## 2018-10-16 DIAGNOSIS — M79609 Pain in unspecified limb: Principal | ICD-10-CM

## 2018-10-19 NOTE — Progress Notes (Signed)
   SUBJECTIVE Patient presents to office today complaining of elongated, thickened nails that cause pain while ambulating in shoes. She is unable to trim her own nails. Patient is here for further evaluation and treatment.  Past Medical History:  Diagnosis Date  . Anxiety   . Depression   . GERD (gastroesophageal reflux disease)   . Hypercalcemia   . Pneumonia   . Sepsis (HCC)   . Sleep apnea   . SOB (shortness of breath) on exertion     OBJECTIVE General Patient is awake, alert, and oriented x 3 and in no acute distress. Derm Skin is dry and supple bilateral. Negative open lesions or macerations. Remaining integument unremarkable. Nails are tender, long, thickened and dystrophic with subungual debris, consistent with onychomycosis, 1-5 bilateral. No signs of infection noted. Vasc  DP and PT pedal pulses palpable bilaterally. Temperature gradient within normal limits.  Neuro Epicritic and protective threshold sensation grossly intact bilaterally.  Musculoskeletal Exam No symptomatic pedal deformities noted bilateral. Muscular strength within normal limits.  ASSESSMENT 1. Onychodystrophic nails 1-5 bilateral with hyperkeratosis of nails.  2. Onychomycosis of nail due to dermatophyte bilateral 3. Pain in foot bilateral  PLAN OF CARE 1. Patient evaluated today.  2. Instructed to maintain good pedal hygiene and foot care.  3. Mechanical debridement of nails 1-5 bilaterally performed using a nail nipper. Filed with dremel without incident.  4. Return to clinic in 3 mos.    Brent M. Evans, DPM [facility]  Dr. Brent M. Evans, DPM    [address]                                        [place], [place] [address]                Office [phone]  Fax [phone]     

## 2018-10-26 ENCOUNTER — Ambulatory Visit: Payer: No Typology Code available for payment source | Admitting: Psychiatry

## 2018-11-09 ENCOUNTER — Ambulatory Visit (INDEPENDENT_AMBULATORY_CARE_PROVIDER_SITE_OTHER): Payer: Medicare Other | Admitting: Psychiatry

## 2018-11-09 ENCOUNTER — Encounter: Payer: Self-pay | Admitting: Psychiatry

## 2018-11-09 ENCOUNTER — Other Ambulatory Visit: Payer: Self-pay

## 2018-11-09 DIAGNOSIS — F0390 Unspecified dementia without behavioral disturbance: Secondary | ICD-10-CM | POA: Diagnosis not present

## 2018-11-09 DIAGNOSIS — F411 Generalized anxiety disorder: Secondary | ICD-10-CM

## 2018-11-09 MED ORDER — ESCITALOPRAM OXALATE 10 MG PO TABS: 10.0000 mg | ORAL_TABLET | Freq: Every day | ORAL | 2 refills | Status: DC

## 2018-11-09 MED ORDER — BUSPIRONE HCL 5 MG PO TABS: 5.0000 mg | ORAL_TABLET | Freq: Two times a day (BID) | ORAL | 1 refills | Status: DC

## 2018-11-09 MED ORDER — ARIPIPRAZOLE 2 MG PO TABS: 2.0000 mg | ORAL_TABLET | Freq: Every day | ORAL | 2 refills | Status: DC

## 2018-11-09 NOTE — Progress Notes (Signed)
Patient is a 85 year old female with history of dementia and anxiety who was contacted via telephone due to Covid pandemic. Her daughter Wylene Men also present. She reported that patient has been doing well and has some anxiety. She has been compliant with her medications. Her daughter reported that she  is staying at home and does not have any change in her symptoms. Her daughter was asking for refills of her medications.  No other acute symptoms noted. Advised patient to stay safe and avoid contact with other people and she agreed with the plan.  Plan  Medication refills with 90 days supply.  I discussed the assessment and treatment plan with the patient. The patient was provided an opportunity to ask questions and all were answered. The patient agreed with the plan and demonstrated an understanding of the instructions.   The patient was advised to call back or seek an in-person evaluation if the symptoms worsen or if the condition fails to improve as anticipated.   I provided 10 minutes of non-face-to-face time during this encounter.

## 2018-11-27 ENCOUNTER — Ambulatory Visit: Payer: Medicare Other | Admitting: Family

## 2018-11-27 ENCOUNTER — Ambulatory Visit (INDEPENDENT_AMBULATORY_CARE_PROVIDER_SITE_OTHER): Payer: Medicare Other | Admitting: Family

## 2018-11-27 DIAGNOSIS — M810 Age-related osteoporosis without current pathological fracture: Secondary | ICD-10-CM

## 2018-11-27 NOTE — Progress Notes (Signed)
Verbal consent for services obtained from patient prior to services given.  Location of call:  provider at work patient at home  Names of all persons present for services: [name], NP Chief complaint:  [name], daughter. Doing well. No concerns.   Short term memory loss. Depression and anxiety.   Dr Gabriel Carina 12/2018- hyperparathyroidism, hypercalcemia.  H/o esophageal stricture. Taking medication appropriately.  Fosamax started 12/2017. No plans for dental procedures. No ckd.   Ankle  swelling- 'good today.' Not on lasix. Comes and goes. Affects both legs. Right ankle  No heat, redness, wounds, sob.   OSA- wear cipap.   gerd- controlled on prilosec. No trouble swallowing.   History, background, results pertinent:  Dementia-follows with Dr. Gretel Acre.   A/P/next steps: Due tetanus/ Tdap. Will return for fasting labs.   No longer following with endocrine. Discussed risks of fosamax including jaw necrosis and atypical fracture. She is aware of these risks and will continue medication at this time.   I spent 15 min  discussing plan of care over the phone.

## 2018-11-27 NOTE — Patient Instructions (Addendum)
Non fasting labs when you can. You may call to schedule.   Due for Tdap - may go to local pharmacy.   May 2021 - due for bone density  We spoke at length about taking Fosamax and the risk thereof.  Please be sure you read the below in regards this medication in its entirety.  Fosamax (Alendronate) weekly tablets What is this medicine? ALENDRONATE (a LEN droe nate) slows calcium loss from bones. It helps to make healthy bone and to slow bone loss in people with osteoporosis. It may be used to treat Paget's disease. This medicine may be used for other purposes; ask your health care provider or pharmacist if you have questions. COMMON BRAND NAME(S): Fosamax What should I tell my health care provider before I take this medicine? They need to know if you have any of these conditions: -esophagus, stomach, or intestine problems, like acid-reflux or GERD -dental disease -kidney disease -low blood calcium -low vitamin D -problems swallowing -problems sitting or standing for 30 minutes -an unusual or allergic reaction to alendronate, other medicines, foods, dyes, or preservatives -pregnant or trying to get pregnant -breast-feeding How should I use this medicine? You must take this medicine exactly as directed or you will lower the amount of medicine you absorb into your body or you may cause yourself harm. Take your dose by mouth first thing in the morning, after you are up for the day. Do not eat or drink anything before you take this medicine. Swallow your medicine with a full glass (6 to 8 fluid ounces) of plain water. Do not take this tablet with any other drink. Do not chew or crush the tablet. After taking this medicine, do not eat breakfast, drink, or take any medicines or vitamins for at least 30 minutes. Stand or sit up for at least 30 minutes after you take this medicine; do not lie down. Take this medicine on the same day every week. Do not take your medicine more often than  directed. Talk to your pediatrician regarding the use of this medicine in children. Special care may be needed. Overdosage: If you think you have taken too much of this medicine contact a poison control center or emergency room at once. NOTE: This medicine is only for you. Do not share this medicine with others. What if I miss a dose? If you miss a dose, take the dose on the morning after you remember. Then take your next dose on your regular day of the week. Never take 2 tablets on the same day. Do not take double or extra doses. What may interact with this medicine? -aluminum hydroxide -antacids -aspirin -calcium supplements -drugs for inflammation like ibuprofen, naproxen, and others -iron supplements -magnesium supplements -vitamins with minerals This list may not describe all possible interactions. Give your health care provider a list of all the medicines, herbs, non-prescription drugs, or dietary supplements you use. Also tell them if you smoke, drink alcohol, or use illegal drugs. Some items may interact with your medicine. What should I watch for while using this medicine? Visit your doctor or health care professional for regular checks ups. It may be some time before you see benefit from this medicine. Do not stop taking your medication except on your doctor's advice. Your doctor or health care professional may order blood tests and other tests to see how you are doing. You should make sure you get enough calcium and vitamin D while you are taking this medicine, unless your doctor tells  you not to. Discuss the foods you eat and the vitamins you take with your health care professional. Some people who take this medicine have severe bone, joint, and/or muscle pain. This medicine may also increase your risk for a broken thigh bone. Tell your doctor right away if you have pain in your upper leg or groin. Tell your doctor if you have any pain that does not go away or that gets worse. This  medicine can make you more sensitive to the sun. If you get a rash while taking this medicine, sunlight may cause the rash to get worse. Keep out of the sun. If you cannot avoid being in the sun, wear protective clothing and use sunscreen. Do not use sun lamps or tanning beds/booths. What side effects may I notice from receiving this medicine? Side effects that you should report to your doctor or health care professional as soon as possible: -allergic reactions like skin rash, itching or hives, swelling of the face, lips, or tongue -black or tarry stools -bone, muscle or joint pain -changes in vision -chest pain -heartburn or stomach pain -jaw pain, especially after dental work -pain or trouble when swallowing -redness, blistering, peeling or loosening of the skin, including inside the mouth Side effects that usually do not require medical attention (report to your doctor or health care professional if they continue or are bothersome): -changes in taste -diarrhea or constipation -eye pain or itching -headache -nausea or vomiting -stomach gas or fullness This list may not describe all possible side effects. Call your doctor for medical advice about side effects. You may report side effects to FDA at 1-800-FDA-1088. Where should I keep my medicine? Keep out of the reach of children. Store at room temperature of 15 and 30 degrees C (59 and 86 degrees F). Throw away any unused medicine after the expiration date. NOTE: This sheet is a summary. It may not cover all possible information. If you have questions about this medicine, talk to your doctor, pharmacist, or health care provider.  2019 Elsevier/Gold Standard (2011-06-13 09:02:42)

## 2019-01-15 ENCOUNTER — Ambulatory Visit: Payer: Medicare Other | Admitting: Podiatry

## 2019-02-01 ENCOUNTER — Other Ambulatory Visit: Payer: Self-pay

## 2019-02-01 ENCOUNTER — Ambulatory Visit (INDEPENDENT_AMBULATORY_CARE_PROVIDER_SITE_OTHER): Payer: Medicare Other | Admitting: Psychiatry

## 2019-02-01 ENCOUNTER — Encounter: Payer: Self-pay | Admitting: Psychiatry

## 2019-02-01 DIAGNOSIS — F411 Generalized anxiety disorder: Secondary | ICD-10-CM

## 2019-02-01 DIAGNOSIS — F0390 Unspecified dementia without behavioral disturbance: Secondary | ICD-10-CM

## 2019-02-01 MED ORDER — ARIPIPRAZOLE 2 MG PO TABS: 2.0000 mg | ORAL_TABLET | Freq: Every day | ORAL | 2 refills | Status: DC

## 2019-02-01 MED ORDER — BUSPIRONE HCL 5 MG PO TABS: 5.0000 mg | ORAL_TABLET | Freq: Two times a day (BID) | ORAL | 1 refills | Status: DC

## 2019-02-01 MED ORDER — ESCITALOPRAM OXALATE 10 MG PO TABS: 10.0000 mg | ORAL_TABLET | Freq: Every day | ORAL | 2 refills | Status: DC

## 2019-02-01 NOTE — Progress Notes (Signed)
Patient ID: [name], female   DOB: August 19, 1931, 85 y.o.   MRN: [id number]    Patient is a 85 yo old female who was followed for medication management.her daughter was also present during the interview. Patient reported that she has been doing well and staying with her daughter. Her daughter is very supportive and has been managing her medications. She reported that she has been helping patients with her medications. She is mostly compliant but occasionally she will forget taking her medications. No other acute issues noted. She has been staying at home and trying to stay away from the virus. She sleeps well at night. She has been helping her daughter in household chores. No other medical or psychiatric issues noted at this time.  Plan Continue medications as prescribed Follow up in four months earlier.  I discussed the assessment and treatment plan with the patient. The patient was provided an opportunity to ask questions and all were answered. The patient agreed with the plan and demonstrated an understanding of the instructions.   The patient was advised to call back or seek an in-person evaluation if the symptoms worsen or if the condition fails to improve as anticipated.   I provided 15 minutes of non-face-to-face time during this encounter.

## 2019-02-19 ENCOUNTER — Ambulatory Visit: Payer: Medicare Other | Admitting: Podiatry

## 2019-03-26 ENCOUNTER — Other Ambulatory Visit: Payer: Self-pay | Admitting: Family

## 2019-03-26 DIAGNOSIS — K219 Gastro-esophageal reflux disease without esophagitis: Secondary | ICD-10-CM

## 2019-04-28 DIAGNOSIS — G4733 Obstructive sleep apnea (adult) (pediatric): Secondary | ICD-10-CM | POA: Diagnosis not present

## 2019-04-28 DIAGNOSIS — E669 Obesity, unspecified: Secondary | ICD-10-CM | POA: Diagnosis not present

## 2019-04-28 DIAGNOSIS — F419 Anxiety disorder, unspecified: Secondary | ICD-10-CM | POA: Diagnosis not present

## 2019-04-28 DIAGNOSIS — F329 Major depressive disorder, single episode, unspecified: Secondary | ICD-10-CM | POA: Diagnosis not present

## 2019-05-07 ENCOUNTER — Ambulatory Visit: Payer: Medicare Other | Admitting: Podiatry

## 2019-06-25 ENCOUNTER — Ambulatory Visit (INDEPENDENT_AMBULATORY_CARE_PROVIDER_SITE_OTHER): Payer: Medicare Other | Admitting: Psychiatry

## 2019-06-25 ENCOUNTER — Other Ambulatory Visit: Payer: Self-pay

## 2019-06-25 ENCOUNTER — Encounter: Payer: Self-pay | Admitting: Psychiatry

## 2019-06-25 DIAGNOSIS — F411 Generalized anxiety disorder: Secondary | ICD-10-CM | POA: Diagnosis not present

## 2019-06-25 DIAGNOSIS — F3342 Major depressive disorder, recurrent, in full remission: Secondary | ICD-10-CM

## 2019-06-25 DIAGNOSIS — F0390 Unspecified dementia without behavioral disturbance: Secondary | ICD-10-CM

## 2019-06-25 DIAGNOSIS — F03918 Unspecified dementia, unspecified severity, with other behavioral disturbance: Secondary | ICD-10-CM | POA: Insufficient documentation

## 2019-06-25 DIAGNOSIS — Z634 Disappearance and death of family member: Secondary | ICD-10-CM | POA: Insufficient documentation

## 2019-06-25 DIAGNOSIS — F0391 Unspecified dementia with behavioral disturbance: Secondary | ICD-10-CM | POA: Insufficient documentation

## 2019-06-25 MED ORDER — ARIPIPRAZOLE 2 MG PO TABS: 2.0000 mg | ORAL_TABLET | Freq: Every day | ORAL | 0 refills | Status: DC

## 2019-06-25 MED ORDER — ESCITALOPRAM OXALATE 10 MG PO TABS: 10.0000 mg | ORAL_TABLET | Freq: Every day | ORAL | 0 refills | Status: DC

## 2019-06-25 MED ORDER — BUSPIRONE HCL 5 MG PO TABS: 5.0000 mg | ORAL_TABLET | Freq: Two times a day (BID) | ORAL | 0 refills | Status: DC

## 2019-06-25 NOTE — Progress Notes (Signed)
[facility] MD OP Progress Note  I connected with  [name] on 06/25/19 by phone and verified that I am speaking with the correct person using two identifiers.   I discussed the limitations of evaluation and management by phone. The patient and her daughter expressed understanding and agreed to proceed.   06/25/2019 11:32 AM [name]  MRN:  [id number]  Chief Complaint:  As per daughter, " She gets anxious a lot."  HPI: This is a 85 y/o female who lives with her daughter. Her daughter and pt were contacted together via speaker phone conversation. Her daughter informed that pt stays very anxious on most occasions. She is excessively worried about her children that don't reside with her. She needs frequent reassurance. She calls them frequently during the week to check on them. She is able to sleep at night often wakes up asking about the dream she had in her sleep. Pt stated that she feels fine except for the worry about her children. She does not think her medications need any adjustment.  Pt's daughter informed that pt has frequent episodes of diarrhea and needs an anti-diarrheal.  Visit Diagnosis:    ICD-10-CM   1. MDD (major depressive disorder), recurrent, in full remission ([place])  F33.42   2. Anxiety state  F41.1   3. Bereavement  Z63.4   4. Dementia without behavioral disturbance, unspecified dementia type ([place])  F03.90     Past Psychiatric History: MDD, anxiety  Past Medical History:  Past Medical History:  Diagnosis Date  . Anxiety   . Depression   . GERD (gastroesophageal reflux disease)   . Hypercalcemia   . Pneumonia   . Sepsis ([place])   . Sleep apnea   . SOB (shortness of breath) on exertion     Past Surgical History:  Procedure Laterality Date  . ENDOMETRIAL ABLATION    . EYE SURGERY  2011   left cataract  . KNEE ARTHROSCOPY Left 05/03/2015   Procedure: left knee arthroscopy, partial medial menisectomy, chondroplasty;  Surgeon: Dereck Leep, MD;  Location:  [facility] ORS;  Service: Orthopedics;  Laterality: Left;  . TONSILLECTOMY      Family Psychiatric History: denied  Family History:  Family History  Problem Relation Age of Onset  . Cancer Mother 33       ovarian   . Pernicious anemia Mother   . COPD Father        nonsmoker  . Cancer Cousin        ovarian ca    Social History:  Social History   Socioeconomic History  . Marital status: Widowed    Spouse name: Not on file  . Number of children: 5  . Years of education: Not on file  . Highest education level: Not on file  Occupational History  . Occupation: retired  Scientific laboratory technician  . Financial resource strain: Somewhat hard  . Food insecurity    Worry: Sometimes true    Inability: Sometimes true  . Transportation needs    Medical: No    Non-medical: No  Tobacco Use  . Smoking status: Never Smoker  . Smokeless tobacco: Never Used  Substance and Sexual Activity  . Alcohol use: No  . Drug use: No  . Sexual activity: Not Currently  Lifestyle  . Physical activity    Days per week: 0 days    Minutes per session: 0 min  . Stress: Rather much  Relationships  . Social Herbalist on  phone: Once a week    Gets together: Once a week    Attends religious service: More than 4 times per year    Active member of club or organization: No    Attends meetings of clubs or organizations: Never    Relationship status: Widowed  Other Topics Concern  . Not on file  Social History Narrative   Regular exercise: not really   Caffeine use: some    Allergies:  Allergies  Allergen Reactions  . Codeine Other (See Comments)    Crying spells  . Darvon [Propoxyphene Hcl] Other (See Comments)    Crying spells  . Propoxyphene Other (See Comments)    Crying, altered mental status    Metabolic Disorder Labs: Lab Results  Component Value Date   HGBA1C 6.0 08/21/2017   No results found for: PROLACTIN Lab Results  Component Value Date   CHOL 178 06/26/2015   TRIG 292.0 (H)  06/26/2015   HDL 41.90 06/26/2015   CHOLHDL 4 06/26/2015   VLDL 58.4 (H) 06/26/2015   LDLCALC 117 (H) 05/07/2013   LDLCALC 115 (H) 12/25/2011   Lab Results  Component Value Date   TSH 3.73 08/21/2017   TSH 2.62 05/07/2013    Therapeutic Level Labs: No results found for: LITHIUM No results found for: VALPROATE No components found for:  CBMZ  Current Medications: Current Outpatient Medications  Medication Sig Dispense Refill  . alendronate (FOSAMAX) 70 MG tablet TK 1 T PO Q 7 DAYS WITH A FULL GLASS OF WATER. DO NOT LIE DOWN FOR THE NEXT 30 MIN  3  . ARIPiprazole (ABILIFY) 2 MG tablet Take 1 tablet (2 mg total) by mouth daily. 90 tablet 2  . aspirin 325 MG EC tablet Take 325 mg by mouth every 6 (six) hours as needed for pain.    . busPIRone (BUSPAR) 5 MG tablet Take 1 tablet (5 mg total) by mouth 2 (two) times daily. 180 tablet 1  . Cholecalciferol (VITAMIN D-3 PO) Take 1 capsule by mouth daily. 500iu    . escitalopram (LEXAPRO) 10 MG tablet Take 1 tablet (10 mg total) by mouth daily. 90 tablet 2  . loperamide (IMODIUM) 2 MG capsule Take 2 mg by mouth 2 (two) times daily.    Marland Kitchen omeprazole (PRILOSEC) 20 MG capsule TAKE 1 CAPSULE BY MOUTH DAILY 30 capsule 5  . triamcinolone cream (KENALOG) 0.1 % Apply topically 2 (two) times daily. (Patient not taking: Reported on 11/27/2018) 454 g 1  . vitamin B-12 (CYANOCOBALAMIN) 1000 MCG tablet Take by mouth.     No current facility-administered medications for this visit.       Psychiatric Specialty Exam: ROS  There were no vitals taken for this visit.There is no height or weight on file to calculate BMI.  General Appearance: Unable to assess due to phone visit  Eye Contact:  Unable to assess due to phone visit  Speech:  Slow and Soft  Volume:  Decreased  Mood:  Anxious  Affect:  Congruent  Thought Process:  Goal Directed, Linear and Descriptions of Associations: Intact  Orientation:  Full (Time, Place, and Person)  Thought Content:  Logical   Suicidal Thoughts:  No  Homicidal Thoughts:  No  Memory:  Recent;   Good  Judgement:  Fair  Insight:  Lacking  Psychomotor Activity:  Normal  Concentration:  Concentration: Fair and Attention Span: Fair  Recall:  [employer] of Knowledge: Fair  Language: Good  Akathisia:  Negative  Handed:  Right  AIMS (if indicated): not done  Assets:  Financial Resources/Insurance Housing Social Support Transportation  ADL's:  Intact  Cognition: WNL  Sleep:  Good   Screenings: AIMS     Office Visit from 06/16/2017 in [facility] Office Visit from 02/07/2016 in [facility] Total Score  0  0    Mini-Mental     Office Visit from 10/09/2015 in [facility]  Total Score (max 30 points )  27    PHQ2-9     Office Visit from 09/18/2017 in [facility] Visit from 05/30/2016 in [facility] Office Visit from 06/26/2015 in [facility] Office Visit from 10/27/2014 in [facility] Office Visit from 05/07/2013 in [facility]  PHQ-2 Total Score  0  0  0  0  0       Assessment and Plan: 85 year old lady for follow-up care.  Patient started informed that patient remains anxious however patient thinks she is okay and does not need any adjustment in her medicines.  Based on patient having frequent diarrhea will not increase the dose of BuSpar at this time.  1. MDD (major depressive disorder), recurrent, in full remission ([place])  - escitalopram (LEXAPRO) 10 MG tablet; Take 1 tablet (10 mg total) by mouth daily.  Dispense: 90 tablet; Refill: 0 - ARIPiprazole (ABILIFY) 2 MG tablet; Take 1 tablet (2 mg total) by mouth daily.  Dispense: 90 tablet; Refill: 0  2. Anxiety state  - busPIRone (BUSPAR) 5 MG tablet; Take 1 tablet (5 mg total) by mouth 2 (two) times daily.  Dispense: 180 tablet; Refill: 0 - escitalopram  (LEXAPRO) 10 MG tablet; Take 1 tablet (10 mg total) by mouth daily.  Dispense: 90 tablet; Refill: 0  3. Bereavement   4. Dementia without behavioral disturbance, unspecified dementia type ([place])  Continue her current medications.   Follow-up in 2 months.  [place] Crane, MD 06/25/2019, 11:32 AM

## 2019-06-28 ENCOUNTER — Ambulatory Visit (INDEPENDENT_AMBULATORY_CARE_PROVIDER_SITE_OTHER): Payer: Medicare Other

## 2019-06-28 ENCOUNTER — Ambulatory Visit: Payer: Medicare Other | Admitting: Psychiatry

## 2019-06-28 ENCOUNTER — Other Ambulatory Visit: Payer: Self-pay

## 2019-06-28 DIAGNOSIS — G4761 Periodic limb movement disorder: Secondary | ICD-10-CM | POA: Diagnosis not present

## 2019-06-28 DIAGNOSIS — Z0000 Encounter for general adult medical examination without abnormal findings: Secondary | ICD-10-CM

## 2019-06-28 DIAGNOSIS — G4733 Obstructive sleep apnea (adult) (pediatric): Secondary | ICD-10-CM | POA: Diagnosis not present

## 2019-06-28 DIAGNOSIS — G2581 Restless legs syndrome: Secondary | ICD-10-CM | POA: Diagnosis not present

## 2019-06-28 NOTE — Patient Instructions (Addendum)
  [name] , Thank you for taking time to come for your Medicare Wellness Visit. I appreciate your ongoing commitment to your health goals. Please review the following plan we discussed and let me know if I can assist you in the future.   These are the goals we discussed: Goals    . Healthy Lifestyle     Stay active as possible Eat healthy Stay hydrated       This is a list of the screening recommended for you and due dates:  Health Maintenance  Topic Date Due  . Tetanus Vaccine  03/14/1951  . Flu Shot  03/13/2019  . DEXA scan (bone density measurement)  Completed  . Pneumonia vaccines  Completed

## 2019-06-28 NOTE — Progress Notes (Signed)
Subjective:   [name] is a 85 y.o. female who presents for Medicare Annual (Subsequent) preventive examination.  Review of Systems:  No ROS.  Medicare Wellness Virtual Visit.  Visual/audio telehealth visit, UTA vital signs.   See social history for additional risk factors.   Cardiac Risk Factors include: advanced age (>23men, >46 women)     Objective:     Vitals: There were no vitals taken for this visit.  There is no height or weight on file to calculate BMI.  Advanced Directives 06/28/2019 06/26/2018 04/21/2015  Does Patient Have a Medical Advance Directive? Yes Yes Yes  Type of Paramedic of [place];Living will [place];Living will [place];Living will  Does patient want to make changes to medical advance directive? No - Patient declined No - Patient declined -  Copy of [place] in Chart? Yes - validated most recent copy scanned in chart (See row information) Yes - validated most recent copy scanned in chart (See row information) -  Some encounter information is confidential and restricted. Go to Review Flowsheets activity to see all data.    Tobacco Social History   Tobacco Use  Smoking Status Never Smoker  Smokeless Tobacco Never Used     Counseling given: Not Answered   Clinical Intake:  Pre-visit preparation completed: Yes        Diabetes: No  How often do you need to have someone help you when you read instructions, pamphlets, or other written materials from your doctor or pharmacy?: 5 - Always  Interpreter Needed?: No     Past Medical History:  Diagnosis Date  . Anxiety   . Depression   . GERD (gastroesophageal reflux disease)   . Hypercalcemia   . Pneumonia   . Sepsis ([place])   . Sleep apnea   . SOB (shortness of breath) on exertion    Past Surgical History:  Procedure Laterality Date  . ENDOMETRIAL ABLATION    . EYE SURGERY  2011   left cataract  .  KNEE ARTHROSCOPY Left 05/03/2015   Procedure: left knee arthroscopy, partial medial menisectomy, chondroplasty;  Surgeon: Dereck Leep, MD;  Location: [facility] ORS;  Service: Orthopedics;  Laterality: Left;  . TONSILLECTOMY     Family History  Problem Relation Age of Onset  . Cancer Mother 84       ovarian   . Pernicious anemia Mother   . COPD Father        nonsmoker  . Cancer Cousin        ovarian ca   Social History   Socioeconomic History  . Marital status: Widowed    Spouse name: Not on file  . Number of children: 5  . Years of education: Not on file  . Highest education level: Not on file  Occupational History  . Occupation: retired  Scientific laboratory technician  . Financial resource strain: Somewhat hard  . Food insecurity    Worry: Sometimes true    Inability: Sometimes true  . Transportation needs    Medical: No    Non-medical: No  Tobacco Use  . Smoking status: Never Smoker  . Smokeless tobacco: Never Used  Substance and Sexual Activity  . Alcohol use: No  . Drug use: No  . Sexual activity: Not Currently  Lifestyle  . Physical activity    Days per week: 0 days    Minutes per session: 0 min  . Stress: Rather much  Relationships  .  Social Herbalist on phone: Once a week    Gets together: Once a week    Attends religious service: More than 4 times per year    Active member of club or organization: No    Attends meetings of clubs or organizations: Never    Relationship status: Widowed  Other Topics Concern  . Not on file  Social History Narrative   Regular exercise: not really   Caffeine use: some    Outpatient Encounter Medications as of 06/28/2019  Medication Sig  . ARIPiprazole (ABILIFY) 2 MG tablet Take 1 tablet (2 mg total) by mouth daily.  Marland Kitchen aspirin 325 MG EC tablet Take 325 mg by mouth every 6 (six) hours as needed for pain.  . busPIRone (BUSPAR) 5 MG tablet Take 1 tablet (5 mg total) by mouth 2 (two) times daily.  . Cholecalciferol (VITAMIN D-3 PO)  Take 1 capsule by mouth daily. 500iu  . escitalopram (LEXAPRO) 10 MG tablet Take 1 tablet (10 mg total) by mouth daily.  Marland Kitchen loperamide (IMODIUM) 2 MG capsule Take 2 mg by mouth 2 (two) times daily.  Marland Kitchen omeprazole (PRILOSEC) 20 MG capsule TAKE 1 CAPSULE BY MOUTH DAILY  . triamcinolone cream (KENALOG) 0.1 % Apply topically 2 (two) times daily.  . vitamin B-12 (CYANOCOBALAMIN) 1000 MCG tablet Take by mouth.  . [DISCONTINUED] alendronate (FOSAMAX) 70 MG tablet TK 1 T PO Q 7 DAYS WITH A FULL GLASS OF WATER. DO NOT LIE DOWN FOR THE NEXT 30 MIN   No facility-administered encounter medications on file as of 06/28/2019.     Activities of Daily Living In your present state of health, do you have any difficulty performing the following activities: 06/28/2019  Hearing? Y  Comment Hearing loss; she does not wear her hearing aids.  Vision? N  Difficulty concentrating or making decisions? Y  Comment Dementia.  Walking or climbing stairs? Y  Comment Unsteady gait. Walker in use.  Dressing or bathing? N  Doing errands, shopping? Y  Comment She does not drive.  Preparing Food and eating ? Y  Using the Toilet? N  In the past six months, have you accidently leaked urine? N  Do you have problems with loss of bowel control? N  Managing your Medications? Y  Managing your Finances? Y  Housekeeping or managing your Housekeeping? Y  Some recent data might be hidden    Patient Care Team: Burnard Hawthorne, FNP as PCP - General (Family Medicine)    Assessment:   This is a routine wellness examination for [name].  Nurse connected with patient and her daughter [name] (HIPAA complaint) 06/28/19 at 11:30 AM EST by a telephone enabled telemedicine application and verified that I am speaking with the correct person using two identifiers. Patient stated full name and DOB. Patient gave permission to continue with virtual visit. Patient's location was at home and Nurse's location was at [place] office.   Health  Maintenance Due: -Influenza vaccine 2020- discussed; to be completed in season with doctor or local pharmacy.   -Tdap- discussed; to be completed with doctor in visit or local pharmacy.   Update all pending maintenance due as appropriate.   See completed HM at the end of note.   Eye: Visual acuity not assessed. Virtual visit.   Dental: Dentures- yes  Hearing: Demonstrates normal hearing during visit. Hearing aids- yes, however she does not wear them.  Safety:  Patient feels safe at home- yes Patient does have smoke detectors at  home- yes Patient does wear sunscreen or protective clothing when in direct sunlight - yes Patient does wear seat belt when in a moving vehicle - yes Patient drives- no Adequate lighting in walkways free from debris- yes Grab bars and handrails used as appropriate- yes Ambulates with a walker as an assistive device  Social: Alcohol intake - no    Smoking history- never   Smokers in home? none Illicit drug use? none  Depression: PHQ 2 &9 complete. See screening below. Denies irritability, anhedonia, sadness/tearfullness.  Stable.  Followed by psychiatry every 3 months.  Falls: See screening below.    Medication: Taking as directed and without issues. Daughter manages.   Covid-19: Precautions and sickness symptoms discussed. Wears mask, social distancing, hand hygiene as appropriate.   Activities of Daily Living Patient denies needing assistance with: feeding themselves. getting from bed to chair, getting to the toilet.  Daughter assists with bathing/showering, dressing, managing money, household chores, and preparing meals.   Memory: Patient is alert. Dementia.   BMI- discussed the importance of a healthy diet, water intake and the benefits of aerobic exercise.  Educational material provided.  Physical activity- no routine.  Diet:  Regular Water: fair intake  Other Providers Patient Care Team: Burnard Hawthorne, FNP as PCP - General  (Family Medicine)  Exercise Activities and Dietary recommendations Current Exercise Habits: The patient does not participate in regular exercise at present  Goals    . Healthy Lifestyle     Stay active as possible Eat healthy Stay hydrated       Fall Risk Fall Risk  06/28/2019 06/26/2018 09/18/2017 05/30/2016 06/26/2015  Falls in the past year? 0 0 No No No  Number falls in past yr: 0 - - - -  Injury with Fall? - - - - -  Risk for fall due to : - - - - -  Follow up Falls prevention discussed - - - -   Timed Get Up and Go performed: no, virtual visit  Depression Screen PHQ 2/9 Scores 06/28/2019 06/26/2018 09/18/2017 05/30/2016  PHQ - 2 Score - - 0 0  Exception Documentation Other- indicate reason in comment box Other- indicate reason in comment box - -     Cognitive Function MMSE - Mini Mental State Exam 10/09/2015  Registration-comments 3  Some encounter information is confidential and restricted. Go to Review Flowsheets activity to see all data.     6CIT Screen 06/26/2018  What Year? 0 points  What month? 0 points  What time? 0 points  Count back from 20 0 points  Months in reverse 0 points  Repeat phrase 0 points  Total Score 0    Immunization History  Administered Date(s) Administered  . Influenza Split 06/24/2014, 06/12/2015  . Influenza, High Dose Seasonal PF 05/30/2016, 05/09/2017, 06/12/2018  . Influenza,inj,Quad PF,6+ Mos 05/07/2013  . Influenza-Unspecified 05/09/2017  . Pneumococcal Conjugate-13 06/26/2015  . Pneumococcal Polysaccharide-23 12/28/2010, 11/02/2012   Screening Tests Health Maintenance  Topic Date Due  . TETANUS/TDAP  03/14/1951  . INFLUENZA VACCINE  03/13/2019  . DEXA SCAN  Completed  . PNA vac Low Risk Adult  Completed      Plan:   Keep all routine maintenance appointments.   Medicare Attestation I have personally reviewed: The patient's medical and social history Their use of alcohol, tobacco or illicit drugs Their current  medications and supplements The patient's functional ability including ADLs,fall risks, home safety risks, cognitive, and hearing and visual impairment Diet and physical activities Evidence  for depression   In addition, I have reviewed and discussed with patient certain preventive protocols, quality metrics, and best practice recommendations. A written personalized care plan for preventive services as well as general preventive health recommendations were provided to patient via mail.     Varney Biles, LPN  [id number]

## 2019-07-02 ENCOUNTER — Ambulatory Visit: Payer: Medicare Other | Admitting: Podiatry

## 2019-08-23 ENCOUNTER — Other Ambulatory Visit: Payer: Self-pay

## 2019-08-23 ENCOUNTER — Ambulatory Visit (INDEPENDENT_AMBULATORY_CARE_PROVIDER_SITE_OTHER): Payer: Medicare Other | Admitting: Psychiatry

## 2019-08-23 ENCOUNTER — Encounter: Payer: Self-pay | Admitting: Psychiatry

## 2019-08-23 DIAGNOSIS — F0390 Unspecified dementia without behavioral disturbance: Secondary | ICD-10-CM | POA: Diagnosis not present

## 2019-08-23 DIAGNOSIS — Z634 Disappearance and death of family member: Secondary | ICD-10-CM | POA: Diagnosis not present

## 2019-08-23 DIAGNOSIS — F411 Generalized anxiety disorder: Secondary | ICD-10-CM

## 2019-08-23 DIAGNOSIS — F3342 Major depressive disorder, recurrent, in full remission: Secondary | ICD-10-CM | POA: Diagnosis not present

## 2019-08-23 MED ORDER — ARIPIPRAZOLE 2 MG PO TABS: 2.0000 mg | ORAL_TABLET | Freq: Every day | ORAL | 0 refills | Status: DC

## 2019-08-23 MED ORDER — BUSPIRONE HCL 5 MG PO TABS: 5.0000 mg | ORAL_TABLET | Freq: Two times a day (BID) | ORAL | 0 refills | Status: DC

## 2019-08-23 MED ORDER — ESCITALOPRAM OXALATE 10 MG PO TABS: 10.0000 mg | ORAL_TABLET | Freq: Every day | ORAL | 0 refills | Status: DC

## 2019-08-23 NOTE — Progress Notes (Signed)
[place] [facility] MD OP Progress Note  I connected with  [name] on 08/23/19 by phone and verified that I am speaking with the correct person using two identifiers.   I discussed the limitations of evaluation and management by phone. The patient and her daughter expressed understanding and agreed to proceed.   08/23/2019 3:36 PM [name]  MRN:  [id number]  Chief Complaint:  As per daughter, " She seems to be doing fine."  HPI: This is a 86 y/o female who lives with her daughter. Her daughter and pt were contacted together via speaker phone conversation. Her daughter informed that pt is doing fine. She still worries about her other children. She still has diarrhea on a daily basis and needs to take imodium regularly. Daughter has tried to discontinue giving her Buspar in the past but that made her very anxious and she started having vivid dreams which improved once she was restarted on Buspar. Pt denied any concerns at this time. She informed she sleeps fine at night for the most part.  Visit Diagnosis:    ICD-10-CM   1. MDD (major depressive disorder), recurrent, in full remission ([place])  F33.42   2. Anxiety state  F41.1   3. Dementia without behavioral disturbance, unspecified dementia type ([place])  F03.90   4. Bereavement  Z63.4     Past Psychiatric History: MDD, anxiety  Past Medical History:  Past Medical History:  Diagnosis Date  . Anxiety   . Depression   . GERD (gastroesophageal reflux disease)   . Hypercalcemia   . Pneumonia   . Sepsis ([place])   . Sleep apnea   . SOB (shortness of breath) on exertion     Past Surgical History:  Procedure Laterality Date  . ENDOMETRIAL ABLATION    . EYE SURGERY  2011   left cataract  . KNEE ARTHROSCOPY Left 05/03/2015   Procedure: left knee arthroscopy, partial medial menisectomy, chondroplasty;  Surgeon: Dereck Leep, MD;  Location: [facility] ORS;  Service: Orthopedics;  Laterality: Left;  . TONSILLECTOMY      Family Psychiatric History:  denied  Family History:  Family History  Problem Relation Age of Onset  . Cancer Mother 76       ovarian   . Pernicious anemia Mother   . COPD Father        nonsmoker  . Cancer Cousin        ovarian ca    Social History:  Social History   Socioeconomic History  . Marital status: Widowed    Spouse name: Not on file  . Number of children: 5  . Years of education: Not on file  . Highest education level: Not on file  Occupational History  . Occupation: retired  Tobacco Use  . Smoking status: Never Smoker  . Smokeless tobacco: Never Used  Substance and Sexual Activity  . Alcohol use: No  . Drug use: No  . Sexual activity: Not Currently  Other Topics Concern  . Not on file  Social History Narrative   Regular exercise: not really   Caffeine use: some   Social Determinants of Health   Financial Resource Strain:   . Difficulty of Paying Living Expenses: Not on file  Food Insecurity:   . Worried About Charity fundraiser in the Last Year: Not on file  . Ran Out of Food in the Last Year: Not on file  Transportation Needs:   . Lack of Transportation (Medical): Not on file  .  Lack of Transportation (Non-Medical): Not on file  Physical Activity:   . Days of Exercise per Week: Not on file  . Minutes of Exercise per Session: Not on file  Stress:   . Feeling of Stress : Not on file  Social Connections:   . Frequency of Communication with Friends and Family: Not on file  . Frequency of Social Gatherings with Friends and Family: Not on file  . Attends Religious Services: Not on file  . Active Member of Clubs or Organizations: Not on file  . Attends Archivist Meetings: Not on file  . Marital Status: Not on file    Allergies:  Allergies  Allergen Reactions  . Codeine Other (See Comments)    Crying spells  . Darvon [Propoxyphene Hcl] Other (See Comments)    Crying spells  . Propoxyphene Other (See Comments)    Crying, altered mental status    Metabolic  Disorder Labs: Lab Results  Component Value Date   HGBA1C 6.0 08/21/2017   No results found for: PROLACTIN Lab Results  Component Value Date   CHOL 178 06/26/2015   TRIG 292.0 (H) 06/26/2015   HDL 41.90 06/26/2015   CHOLHDL 4 06/26/2015   VLDL 58.4 (H) 06/26/2015   LDLCALC 117 (H) 05/07/2013   LDLCALC 115 (H) 12/25/2011   Lab Results  Component Value Date   TSH 3.73 08/21/2017   TSH 2.62 05/07/2013    Therapeutic Level Labs: No results found for: LITHIUM No results found for: VALPROATE No components found for:  CBMZ  Current Medications: Current Outpatient Medications  Medication Sig Dispense Refill  . ARIPiprazole (ABILIFY) 2 MG tablet Take 1 tablet (2 mg total) by mouth daily. 90 tablet 0  . aspirin 325 MG EC tablet Take 325 mg by mouth every 6 (six) hours as needed for pain.    . busPIRone (BUSPAR) 5 MG tablet Take 1 tablet (5 mg total) by mouth 2 (two) times daily. 180 tablet 0  . Cholecalciferol (VITAMIN D-3 PO) Take 1 capsule by mouth daily. 500iu    . escitalopram (LEXAPRO) 10 MG tablet Take 1 tablet (10 mg total) by mouth daily. 90 tablet 0  . loperamide (IMODIUM) 2 MG capsule Take 2 mg by mouth 2 (two) times daily.    Marland Kitchen omeprazole (PRILOSEC) 20 MG capsule TAKE 1 CAPSULE BY MOUTH DAILY 30 capsule 5  . triamcinolone cream (KENALOG) 0.1 % Apply topically 2 (two) times daily. 454 g 1  . vitamin B-12 (CYANOCOBALAMIN) 1000 MCG tablet Take by mouth.     No current facility-administered medications for this visit.      Psychiatric Specialty Exam: ROS  There were no vitals taken for this visit.There is no height or weight on file to calculate BMI.  General Appearance: Unable to assess due to phone visit  Eye Contact:  Unable to assess due to phone visit  Speech:  Slow and Soft  Volume:  Decreased  Mood:  Anxious  Affect:  Congruent  Thought Process:  Goal Directed, Linear and Descriptions of Associations: Intact  Orientation:  Full (Time, Place, and Person)   Thought Content: Logical   Suicidal Thoughts:  No  Homicidal Thoughts:  No  Memory:  Recent;   Good  Judgement:  Fair  Insight:  Lacking  Psychomotor Activity:  Normal  Concentration:  Concentration: Fair and Attention Span: Fair  Recall:  [employer] of Knowledge: Fair  Language: Good  Akathisia:  Negative  Handed:  Right  AIMS (if indicated):  not done  Assets:  Financial Resources/Insurance Housing Social Support Transportation  ADL's:  Intact  Cognition: WNL  Sleep:  Good      Screenings: AIMS     Office Visit from 06/16/2017 in [facility] Office Visit from 02/07/2016 in [facility] Total Score  0  0    Mini-Mental     Office Visit from 10/09/2015 in [place]  Total Score (max 30 points )  27    PHQ2-9     Office Visit from 09/18/2017 in [facility] [place] Visit from 05/30/2016 in [facility] Office Visit from 06/26/2015 in [facility] Office Visit from 10/27/2014 in [facility] Office Visit from 05/07/2013 in [facility]  PHQ-2 Total Score  0  0  0  0  0       Assessment and Plan: Pt's daughter believes pt is doing well on her current regimen, so will continue the same.  1. MDD (major depressive disorder), recurrent, in full remission ([place])  - escitalopram (LEXAPRO) 10 MG tablet; Take 1 tablet (10 mg total) by mouth daily.  Dispense: 90 tablet; Refill: 0 - ARIPiprazole (ABILIFY) 2 MG tablet; Take 1 tablet (2 mg total) by mouth daily.  Dispense: 90 tablet; Refill: 0  2. Anxiety state  - busPIRone (BUSPAR) 5 MG tablet; Take 1 tablet (5 mg total) by mouth 2 (two) times daily.  Dispense: 180 tablet; Refill: 0 - escitalopram (LEXAPRO) 10 MG tablet; Take 1 tablet (10 mg total) by mouth daily.  Dispense: 90 tablet; Refill: 0  3. Bereavement   4. Dementia without behavioral  disturbance, unspecified dementia type ([place])  Continue current medications.   Follow-up in 3 months.  [place] Crane, MD 08/23/2019, 3:36 PM

## 2019-09-20 ENCOUNTER — Other Ambulatory Visit: Payer: Self-pay | Admitting: Psychiatry

## 2019-09-20 ENCOUNTER — Other Ambulatory Visit: Payer: Self-pay | Admitting: Family

## 2019-09-20 DIAGNOSIS — K219 Gastro-esophageal reflux disease without esophagitis: Secondary | ICD-10-CM

## 2019-09-20 DIAGNOSIS — F3342 Major depressive disorder, recurrent, in full remission: Secondary | ICD-10-CM

## 2019-10-14 ENCOUNTER — Other Ambulatory Visit: Payer: Self-pay | Admitting: Family

## 2019-10-14 DIAGNOSIS — K219 Gastro-esophageal reflux disease without esophagitis: Secondary | ICD-10-CM

## 2019-10-15 ENCOUNTER — Ambulatory Visit: Payer: Medicare Other | Admitting: Podiatry

## 2019-11-15 ENCOUNTER — Ambulatory Visit (INDEPENDENT_AMBULATORY_CARE_PROVIDER_SITE_OTHER): Payer: Medicare Other | Admitting: Psychiatry

## 2019-11-15 ENCOUNTER — Other Ambulatory Visit: Payer: Self-pay

## 2019-11-15 ENCOUNTER — Encounter: Payer: Self-pay | Admitting: Psychiatry

## 2019-11-15 DIAGNOSIS — F0390 Unspecified dementia without behavioral disturbance: Secondary | ICD-10-CM

## 2019-11-15 DIAGNOSIS — F411 Generalized anxiety disorder: Secondary | ICD-10-CM

## 2019-11-15 DIAGNOSIS — F3342 Major depressive disorder, recurrent, in full remission: Secondary | ICD-10-CM

## 2019-11-15 NOTE — Progress Notes (Signed)
[facility] MD OP Progress Note  I connected with  [name] on 11/15/19 by phone and verified that I am speaking with the correct person using two identifiers.   I discussed the limitations of evaluation and management by phone. The patient and her daughter expressed understanding and agreed to proceed.   11/15/2019 11:01 AM [name]  MRN:  [id number]  Chief Complaint:    HPI: This is a 86 y/o female who lives with her daughter. Her daughter and pt were contacted together via speaker phone conversation. Patient reported that she is doing well for the most part. She stated that she is able to sleep well at night. She informed that she has occasional nightmares. She stated her anxiety is better. She acknowledged getting COVID-19 vaccination and denied having any major problems after getting it. She informed her diarrhea is a little better now. Her daughter acknowledged everything the patient said and stated that patient is doing well for the most part. The requested to keep the medicine regimen the same way for now.  Visit Diagnosis:    ICD-10-CM   1. MDD (major depressive disorder), recurrent, in full remission ([place])  F33.42   2. Anxiety state  F41.1   3. Dementia without behavioral disturbance, unspecified dementia type ([place])  F03.90     Past Psychiatric History: MDD, anxiety  Past Medical History:  Past Medical History:  Diagnosis Date  . Anxiety   . Depression   . GERD (gastroesophageal reflux disease)   . Hypercalcemia   . Pneumonia   . Sepsis ([place])   . Sleep apnea   . SOB (shortness of breath) on exertion     Past Surgical History:  Procedure Laterality Date  . ENDOMETRIAL ABLATION    . EYE SURGERY  2011   left cataract  . KNEE ARTHROSCOPY Left 05/03/2015   Procedure: left knee arthroscopy, partial medial menisectomy, chondroplasty;  Surgeon: Dereck Leep, MD;  Location: [facility] ORS;  Service: Orthopedics;  Laterality: Left;  . TONSILLECTOMY      Family Psychiatric  History: denied  Family History:  Family History  Problem Relation Age of Onset  . Cancer Mother 49       ovarian   . Pernicious anemia Mother   . COPD Father        nonsmoker  . Cancer Cousin        ovarian ca    Social History:  Social History   Socioeconomic History  . Marital status: Widowed    Spouse name: Not on file  . Number of children: 5  . Years of education: Not on file  . Highest education level: Not on file  Occupational History  . Occupation: retired  Tobacco Use  . Smoking status: Never Smoker  . Smokeless tobacco: Never Used  Substance and Sexual Activity  . Alcohol use: No  . Drug use: No  . Sexual activity: Not Currently  Other Topics Concern  . Not on file  Social History Narrative   Regular exercise: not really   Caffeine use: some   Social Determinants of Health   Financial Resource Strain:   . Difficulty of Paying Living Expenses:   Food Insecurity:   . Worried About Charity fundraiser in the Last Year:   . Arboriculturist in the Last Year:   Transportation Needs:   . Film/video editor (Medical):   Marland Kitchen Lack of Transportation (Non-Medical):   Physical Activity:   . Days of  Exercise per Week:   . Minutes of Exercise per Session:   Stress:   . Feeling of Stress :   Social Connections:   . Frequency of Communication with Friends and Family:   . Frequency of Social Gatherings with Friends and Family:   . Attends Religious Services:   . Active Member of Clubs or Organizations:   . Attends Archivist Meetings:   Marland Kitchen Marital Status:     Allergies:  Allergies  Allergen Reactions  . Codeine Other (See Comments)    Crying spells  . Darvon [Propoxyphene Hcl] Other (See Comments)    Crying spells  . Propoxyphene Other (See Comments)    Crying, altered mental status    Metabolic Disorder Labs: Lab Results  Component Value Date   HGBA1C 6.0 08/21/2017   No results found for: PROLACTIN Lab Results  Component Value Date    CHOL 178 06/26/2015   TRIG 292.0 (H) 06/26/2015   HDL 41.90 06/26/2015   CHOLHDL 4 06/26/2015   VLDL 58.4 (H) 06/26/2015   LDLCALC 117 (H) 05/07/2013   LDLCALC 115 (H) 12/25/2011   Lab Results  Component Value Date   TSH 3.73 08/21/2017   TSH 2.62 05/07/2013    Therapeutic Level Labs: No results found for: LITHIUM No results found for: VALPROATE No components found for:  CBMZ  Current Medications: Current Outpatient Medications  Medication Sig Dispense Refill  . ARIPiprazole (ABILIFY) 2 MG tablet Take 1 tablet (2 mg total) by mouth daily. 90 tablet 0  . aspirin 325 MG EC tablet Take 325 mg by mouth every 6 (six) hours as needed for pain.    . busPIRone (BUSPAR) 5 MG tablet Take 1 tablet (5 mg total) by mouth 2 (two) times daily. 180 tablet 0  . Cholecalciferol (VITAMIN D-3 PO) Take 1 capsule by mouth daily. 500iu    . escitalopram (LEXAPRO) 10 MG tablet Take 1 tablet (10 mg total) by mouth daily. 90 tablet 0  . loperamide (IMODIUM) 2 MG capsule Take 2 mg by mouth 2 (two) times daily.    Marland Kitchen omeprazole (PRILOSEC) 20 MG capsule TAKE 1 CAPSULE BY MOUTH DAILY 30 capsule 5  . triamcinolone cream (KENALOG) 0.1 % Apply topically 2 (two) times daily. 454 g 1  . vitamin B-12 (CYANOCOBALAMIN) 1000 MCG tablet Take by mouth.     No current facility-administered medications for this visit.      Psychiatric Specialty Exam: ROS  There were no vitals taken for this visit.There is no height or weight on file to calculate BMI.  General Appearance: Unable to assess due to phone visit  Eye Contact:  Unable to assess due to phone visit  Speech:  Slow and Soft  Volume:  Decreased  Mood:  Anxious  Affect:  Congruent  Thought Process:  Goal Directed, Linear and Descriptions of Associations: Intact  Orientation:  Full (Time, Place, and Person)  Thought Content: Logical   Suicidal Thoughts:  No  Homicidal Thoughts:  No  Memory:  Recent;   Good  Judgement:  Fair  Insight:  Lacking   Psychomotor Activity:  Normal  Concentration:  Concentration: Fair and Attention Span: Fair  Recall:  [employer] of Knowledge: Fair  Language: Good  Akathisia:  Negative  Handed:  Right  AIMS (if indicated): not done  Assets:  Financial Resources/Insurance Housing Social Support Transportation  ADL's:  Intact  Cognition: WNL  Sleep:  Good      Screenings: AIMS  Office Visit from 06/16/2017 in [facility] Office Visit from 02/07/2016 in [place] Total Score  0  0    Mini-Mental     Office Visit from 10/09/2015 in [place]  Total Score (max 30 points )  27    PHQ2-9     Office Visit from 09/18/2017 in [facility] Visit from 05/30/2016 in [facility] Office Visit from 06/26/2015 in [place] Office Visit from 10/27/2014 in [place] Office Visit from 05/07/2013 in [place]  PHQ-2 Total Score  0  0  0  0  0       Assessment and Plan: Patient and her daughter informed that she is doing well on the current regimen. We will continue the same medications.   1. MDD (major depressive disorder), recurrent, in full remission ([place])  - escitalopram (LEXAPRO) 10 MG tablet; Take 1 tablet (10 mg total) by mouth daily.  Dispense: 90 tablet; Refill: 0 - ARIPiprazole (ABILIFY) 2 MG tablet; Take 1 tablet (2 mg total) by mouth daily.  Dispense: 90 tablet; Refill: 0  2. Anxiety state  - busPIRone (BUSPAR) 5 MG tablet; Take 1 tablet (5 mg total) by mouth 2 (two) times daily.  Dispense: 180 tablet; Refill: 0 - escitalopram (LEXAPRO) 10 MG tablet; Take 1 tablet (10 mg total) by mouth daily.  Dispense: 90 tablet; Refill: 0  3. Dementia without behavioral disturbance, unspecified dementia type ([place])  Continue current medications.   Follow-up in 3 months.  [place] Crane, MD 11/15/2019,  11:01 AM

## 2019-12-17 ENCOUNTER — Ambulatory Visit: Payer: Medicare Other | Admitting: Podiatry

## 2019-12-27 ENCOUNTER — Telehealth: Payer: Self-pay

## 2019-12-27 DIAGNOSIS — F3342 Major depressive disorder, recurrent, in full remission: Secondary | ICD-10-CM

## 2019-12-27 DIAGNOSIS — F411 Generalized anxiety disorder: Secondary | ICD-10-CM

## 2019-12-27 MED ORDER — ARIPIPRAZOLE 2 MG PO TABS: 2.0000 mg | ORAL_TABLET | Freq: Every day | ORAL | 0 refills | Status: DC

## 2019-12-27 MED ORDER — BUSPIRONE HCL 5 MG PO TABS: 5.0000 mg | ORAL_TABLET | Freq: Two times a day (BID) | ORAL | 0 refills | Status: DC

## 2019-12-27 MED ORDER — ESCITALOPRAM OXALATE 10 MG PO TABS: 10.0000 mg | ORAL_TABLET | Freq: Every day | ORAL | 0 refills | Status: DC

## 2019-12-27 NOTE — Telephone Encounter (Signed)
Prescriptions sent

## 2019-12-27 NOTE — Telephone Encounter (Signed)
pt called states that her mother needs refills on all her medications she is completly out .

## 2019-12-31 ENCOUNTER — Encounter: Payer: Self-pay | Admitting: Podiatry

## 2019-12-31 ENCOUNTER — Other Ambulatory Visit: Payer: Self-pay

## 2019-12-31 ENCOUNTER — Ambulatory Visit: Payer: Medicare Other | Admitting: Podiatry

## 2019-12-31 DIAGNOSIS — B351 Tinea unguium: Secondary | ICD-10-CM | POA: Diagnosis not present

## 2019-12-31 DIAGNOSIS — M79676 Pain in unspecified toe(s): Secondary | ICD-10-CM

## 2020-01-03 NOTE — Progress Notes (Signed)
   SUBJECTIVE Patient presents to office today complaining of elongated, thickened nails that cause pain while ambulating in shoes. She is unable to trim her own nails. Patient is here for further evaluation and treatment.  Past Medical History:  Diagnosis Date  . Anxiety   . Depression   . GERD (gastroesophageal reflux disease)   . Hypercalcemia   . Pneumonia   . Sepsis ([place])   . Sleep apnea   . SOB (shortness of breath) on exertion     OBJECTIVE General Patient is awake, alert, and oriented x 3 and in no acute distress. Derm Skin is dry and supple bilateral. Negative open lesions or macerations. Remaining integument unremarkable. Nails are tender, long, thickened and dystrophic with subungual debris, consistent with onychomycosis, 1-5 bilateral. No signs of infection noted. Vasc  DP and PT pedal pulses palpable bilaterally. Temperature gradient within normal limits.  Neuro Epicritic and protective threshold sensation grossly intact bilaterally.  Musculoskeletal Exam No symptomatic pedal deformities noted bilateral. Muscular strength within normal limits.  ASSESSMENT 1. Onychodystrophic nails 1-5 bilateral with hyperkeratosis of nails.  2. Onychomycosis of nail due to dermatophyte bilateral 3. Pain in foot bilateral  PLAN OF CARE 1. Patient evaluated today.  2. Instructed to maintain good pedal hygiene and foot care.  3. Mechanical debridement of nails 1-5 bilaterally performed using a nail nipper. Filed with dremel without incident.  4. Return to clinic in 3 mos.    Edrick Kins, DPM [facility]  Dr. Edrick Kins, [address]                                        [place], [place] [address]                Office [phone]  Fax [phone]

## 2020-01-04 ENCOUNTER — Telehealth (INDEPENDENT_AMBULATORY_CARE_PROVIDER_SITE_OTHER): Payer: Medicare Other | Admitting: Psychiatry

## 2020-01-04 ENCOUNTER — Encounter: Payer: Self-pay | Admitting: Psychiatry

## 2020-01-04 ENCOUNTER — Other Ambulatory Visit: Payer: Self-pay

## 2020-01-04 DIAGNOSIS — F3342 Major depressive disorder, recurrent, in full remission: Secondary | ICD-10-CM

## 2020-01-04 DIAGNOSIS — F0390 Unspecified dementia without behavioral disturbance: Secondary | ICD-10-CM

## 2020-01-04 DIAGNOSIS — F411 Generalized anxiety disorder: Secondary | ICD-10-CM

## 2020-01-04 MED ORDER — BUSPIRONE HCL 5 MG PO TABS: 5.0000 mg | ORAL_TABLET | Freq: Two times a day (BID) | ORAL | 0 refills | Status: DC

## 2020-01-04 MED ORDER — ESCITALOPRAM OXALATE 10 MG PO TABS: 10.0000 mg | ORAL_TABLET | Freq: Every day | ORAL | 0 refills | Status: DC

## 2020-01-04 MED ORDER — ARIPIPRAZOLE 2 MG PO TABS: 2.0000 mg | ORAL_TABLET | Freq: Every day | ORAL | 0 refills | Status: DC

## 2020-01-04 NOTE — Progress Notes (Addendum)
[facility] MD OP Progress Note Virtual Visit via Video Note  I connected with [name] on 01/04/20 at 11:00 AM EDT by a video enabled telemedicine application and verified that I am speaking with the correct person using two identifiers.  Location: Patient: Home Provider: Clinic   I discussed the limitations of evaluation and management by telemedicine and the availability of in person appointments. The patient expressed understanding and agreed to proceed.  I provided 12 minutes of non-face-to-face time during this encounter.    01/04/2020 11:20 AM [name]  MRN:  [id number]  Chief Complaint:  "I'm doing pretty well"  HPI: Patient was seen with her daughter and son. Today she reports that she is doing well overall however reports that at times she feels anxious. Patient reports that her anxiety is at baseline and reports that she believes Lexapro and Buspar has been effective in managing her anxiety. She also report that Abilify has been effective in managing her depressive symptoms. She endorses adequate sleep.  Her daughter reports that her mother's mood and anxiety has been more stable on current regimen. She report that her mother becomes anxious when she thinks about things that are going on around her. She reports that she is concerned to make any medication adjustments at this time due to potential side effects. Patient and her daughter are agreeable to continue current medication regimen.   Patients daughter reports that her mother continues to have loose stools. No other concerns noted at this time.    Visit Diagnosis:    ICD-10-CM   1. MDD (major depressive disorder), recurrent, in full remission ([place])  F33.42 ARIPiprazole (ABILIFY) 2 MG tablet    escitalopram (LEXAPRO) 10 MG tablet  2. Anxiety state  F41.1 busPIRone (BUSPAR) 5 MG tablet    escitalopram (LEXAPRO) 10 MG tablet  3. Dementia without behavioral disturbance, unspecified dementia type ([place])  F03.90      Past Psychiatric History: MDD, anxiety  Past Medical History:  Past Medical History:  Diagnosis Date  . Anxiety   . Depression   . GERD (gastroesophageal reflux disease)   . Hypercalcemia   . Pneumonia   . Sepsis ([place])   . Sleep apnea   . SOB (shortness of breath) on exertion     Past Surgical History:  Procedure Laterality Date  . ENDOMETRIAL ABLATION    . EYE SURGERY  2011   left cataract  . KNEE ARTHROSCOPY Left 05/03/2015   Procedure: left knee arthroscopy, partial medial menisectomy, chondroplasty;  Surgeon: Dereck Leep, MD;  Location: [facility] ORS;  Service: Orthopedics;  Laterality: Left;  . TONSILLECTOMY      Family Psychiatric History: denied  Family History:  Family History  Problem Relation Age of Onset  . Cancer Mother 23       ovarian   . Pernicious anemia Mother   . COPD Father        nonsmoker  . Cancer Cousin        ovarian ca    Social History:  Social History   Socioeconomic History  . Marital status: Widowed    Spouse name: Not on file  . Number of children: 5  . Years of education: Not on file  . Highest education level: Not on file  Occupational History  . Occupation: retired  Tobacco Use  . Smoking status: Never Smoker  . Smokeless tobacco: Never Used  Substance and Sexual Activity  . Alcohol use: No  . Drug use: No  .  Sexual activity: Not Currently  Other Topics Concern  . Not on file  Social History Narrative   Regular exercise: not really   Caffeine use: some   Social Determinants of Health   Financial Resource Strain:   . Difficulty of Paying Living Expenses:   Food Insecurity:   . Worried About Charity fundraiser in the Last Year:   . Arboriculturist in the Last Year:   Transportation Needs:   . Film/video editor (Medical):   Marland Kitchen Lack of Transportation (Non-Medical):   Physical Activity:   . Days of Exercise per Week:   . Minutes of Exercise per Session:   Stress:   . Feeling of Stress :   Social  Connections:   . Frequency of Communication with Friends and Family:   . Frequency of Social Gatherings with Friends and Family:   . Attends Religious Services:   . Active Member of Clubs or Organizations:   . Attends Archivist Meetings:   Marland Kitchen Marital Status:     Allergies:  Allergies  Allergen Reactions  . Codeine Other (See Comments)    Crying spells  . Darvon [Propoxyphene Hcl] Other (See Comments)    Crying spells  . Propoxyphene Other (See Comments)    Crying, altered mental status    Metabolic Disorder Labs: Lab Results  Component Value Date   HGBA1C 6.0 08/21/2017   No results found for: PROLACTIN Lab Results  Component Value Date   CHOL 178 06/26/2015   TRIG 292.0 (H) 06/26/2015   HDL 41.90 06/26/2015   CHOLHDL 4 06/26/2015   VLDL 58.4 (H) 06/26/2015   LDLCALC 117 (H) 05/07/2013   LDLCALC 115 (H) 12/25/2011   Lab Results  Component Value Date   TSH 3.73 08/21/2017   TSH 2.62 05/07/2013    Therapeutic Level Labs: No results found for: LITHIUM No results found for: VALPROATE No components found for:  CBMZ  Current Medications: Current Outpatient Medications  Medication Sig Dispense Refill  . alendronate (FOSAMAX) 70 MG tablet TAKE 1 TABLET BY MOUTH EVERY 7 DAYS WITH A FULL GLASS OF WATER. DO NOT LIE DOWN FOR THE NEXT 30 MINUTES    . ARIPiprazole (ABILIFY) 2 MG tablet Take 1 tablet (2 mg total) by mouth daily. 90 tablet 0  . aspirin 325 MG EC tablet Take 325 mg by mouth every 6 (six) hours as needed for pain.    . busPIRone (BUSPAR) 5 MG tablet Take 1 tablet (5 mg total) by mouth 2 (two) times daily. 180 tablet 0  . Cholecalciferol (VITAMIN D-3 PO) Take 1 capsule by mouth daily. 500iu    . escitalopram (LEXAPRO) 10 MG tablet Take 1 tablet (10 mg total) by mouth daily. 90 tablet 0  . loperamide (IMODIUM) 2 MG capsule Take 2 mg by mouth 2 (two) times daily.    Marland Kitchen omeprazole (PRILOSEC) 20 MG capsule TAKE 1 CAPSULE BY MOUTH DAILY 30 capsule 5  .  triamcinolone cream (KENALOG) 0.1 % Apply topically 2 (two) times daily. 454 g 1  . vitamin B-12 (CYANOCOBALAMIN) 1000 MCG tablet Take by mouth.     No current facility-administered medications for this visit.      Psychiatric Specialty Exam: ROS  There were no vitals taken for this visit.There is no height or weight on file to calculate BMI.  General Appearance: Well Groomed  Eye Contact:  Good  Speech:  Slow and Soft  Volume:  Decreased  Mood:  Anxious  Affect:  Congruent  Thought Process:  Goal Directed, Linear and Descriptions of Associations: Intact  Orientation:  Full (Time, Place, and Person)  Thought Content: Logical   Suicidal Thoughts:  No  Homicidal Thoughts:  No  Memory:  Recent;   Good  Judgement:  Fair  Insight:  Lacking  Psychomotor Activity:  Normal  Concentration:  Concentration: Fair and Attention Span: Fair  Recall:  [employer] of Knowledge: Fair  Language: Good  Akathisia:  Negative  Handed:  Right  AIMS (if indicated): not done  Assets:  Financial Resources/Insurance Housing Social Support Transportation  ADL's:  Intact  Cognition: WNL  Sleep:  Good      Screenings: AIMS     Office Visit from 06/16/2017 in [place] Visit from 02/07/2016 in [place] Total Score  0  0    Mini-Mental     Office Visit from 10/09/2015 in [place]  Total Score (max 30 points )  27    PHQ2-9     Office Visit from 09/18/2017 in [facility] from 05/30/2016 in [facility] Office Visit from 06/26/2015 in [facility] from 10/27/2014 in [facility] Visit from 05/07/2013 in [facility]  PHQ-2 Total Score  0  0  0  0  0       Assessment and Plan: Patient, her daughter, and her son reports that she is doing well on current medicationt  regimen. She is agreeable to continue same medications.   1. MDD (major depressive disorder), recurrent, in full remission ([place])  - ARIPiprazole (ABILIFY) 2 MG tablet; Take 1 tablet (2 mg total) by mouth daily.  Dispense: 90 tablet; Refill: 0 - escitalopram (LEXAPRO) 10 MG tablet; Take 1 tablet (10 mg total) by mouth daily.  Dispense: 90 tablet; Refill: 0  2. Anxiety state  - busPIRone (BUSPAR) 5 MG tablet; Take 1 tablet (5 mg total) by mouth 2 (two) times daily.  Dispense: 180 tablet; Refill: 0 - escitalopram (LEXAPRO) 10 MG tablet; Take 1 tablet (10 mg total) by mouth daily.  Dispense: 90 tablet; Refill: 0  3. Dementia without behavioral disturbance, unspecified dementia type ([place])   Continue current medications.   Follow-up in 3 months.  Salley Slaughter, NP 01/04/2020, 11:20 AM   I saw and managed this patient with NP B. Ronne Binning.  [place] Crane, MD 01/04/2020 11:55 AM

## 2020-03-12 ENCOUNTER — Other Ambulatory Visit: Payer: Self-pay | Admitting: Family

## 2020-03-12 DIAGNOSIS — K219 Gastro-esophageal reflux disease without esophagitis: Secondary | ICD-10-CM

## 2020-03-20 ENCOUNTER — Other Ambulatory Visit: Payer: Self-pay | Admitting: Psychiatry

## 2020-03-20 DIAGNOSIS — F3342 Major depressive disorder, recurrent, in full remission: Secondary | ICD-10-CM

## 2020-03-30 ENCOUNTER — Telehealth (INDEPENDENT_AMBULATORY_CARE_PROVIDER_SITE_OTHER): Payer: Medicare Other | Admitting: Psychiatry

## 2020-03-30 ENCOUNTER — Other Ambulatory Visit: Payer: Self-pay

## 2020-03-30 ENCOUNTER — Encounter: Payer: Self-pay | Admitting: Psychiatry

## 2020-03-30 DIAGNOSIS — Z79899 Other long term (current) drug therapy: Secondary | ICD-10-CM | POA: Diagnosis not present

## 2020-03-30 DIAGNOSIS — Z9189 Other specified personal risk factors, not elsewhere classified: Secondary | ICD-10-CM | POA: Diagnosis not present

## 2020-03-30 DIAGNOSIS — F0391 Unspecified dementia with behavioral disturbance: Secondary | ICD-10-CM

## 2020-03-30 DIAGNOSIS — F3342 Major depressive disorder, recurrent, in full remission: Secondary | ICD-10-CM | POA: Diagnosis not present

## 2020-03-30 MED ORDER — BUSPIRONE HCL 5 MG PO TABS: 5.0000 mg | ORAL_TABLET | Freq: Two times a day (BID) | ORAL | 0 refills | Status: DC

## 2020-03-30 MED ORDER — ESCITALOPRAM OXALATE 10 MG PO TABS: 10.0000 mg | ORAL_TABLET | Freq: Every day | ORAL | 0 refills | Status: DC

## 2020-03-30 NOTE — Progress Notes (Signed)
Provider Location : [facility] Patient Location : Home  Participants: Patient ,Daughter - [name], Provider  Virtual Visit via Video Note  I connected with LIZETTE PAZOS on 03/30/20 at  3:00 PM EDT by a video enabled telemedicine application and verified that I am speaking with the correct person using two identifiers.   I discussed the limitations of evaluation and management by telemedicine and the availability of in person appointments. The patient expressed understanding and agreed to proceed.    I discussed the assessment and treatment plan with the patient. The patient was provided an opportunity to ask questions and all were answered. The patient agreed with the plan and demonstrated an understanding of the instructions.   The patient was advised to call back or seek an in-person evaluation if the symptoms worsen or if the condition fails to improve as anticipated.   [place] MD OP Progress Note  03/30/2020 4:08 PM Iram REMMY CRASS  MRN:  [id number]  Chief Complaint:  Chief Complaint    Follow-up     HPI: WILSON SAMPLE is a 86 year old Caucasian female, has a history of depression, anxiety, dementia , gastroesophageal reflux disease, obstructive sleep apnea compliant on CPAP, osteoporosis, chronic kidney disease stage III, was evaluated by telemedicine today.  Patient being a limited historian-daughter provided information in session today.  Patient today appeared to be alert, oriented to person situation and place.  Patient's speech appeared to be slow however she was able to answer questions in short phrases.  Patient reports sleep is good and appetite is very good.  Per daughter patient has been observed as possibly paranoid recently.  She has been dealing with paranoia since the past several years.  However recently she has been talking about people looking at her from TV.  She also currently tends to close her bathroom door since she is worried about these people looking at her from TV.   Otherwise patient does not seem to have any significant depression or anxiety symptoms at this time.  Her paranoia also does not seem to be too distressing for her.  She likely is able to cope with it.  She is sleeping well.  She uses her CPAP machine.  Her CPAP has not been working properly so they are planning to get that fixed.  She tends to have a sweet tooth and craves sweets a lot.  Patient otherwise is eating okay.  She is compliant on her medications.  She has not had any significant falls recently other than once when she fell since her chair tripped due to the way she was sitting on it.  She uses a cane or a walker when she walks.  She does have mild tremors of her right hand which has been going on since the past several years.  Unknown if this is secondary Parkinsonism due to her medications.  Patient denies any suicidality, homicidality.  Patient denies any other concerns today.    Visit Diagnosis:    ICD-10-CM   1. MDD (major depressive disorder), recurrent, in full remission ([place])  F33.42 escitalopram (LEXAPRO) 10 MG tablet  2. Dementia with behavioral disturbance, unspecified dementia type (HCC)  F03.91 busPIRone (BUSPAR) 5 MG tablet    escitalopram (LEXAPRO) 10 MG tablet  3. High risk medication use  Z79.899 Lipid panel    Basic metabolic panel    Prolactin  4. At risk for prolonged QT interval syndrome  Z91.89 EKG 12-Lead    Past Psychiatric History: I have reviewed medical records  in [facility] per Dr. Toy Care dated 01/04/2020 as well as Dr. Jennet Maduro recent one 02/01/2019.  Past trials of multiple medications including Exelon, Namenda  Past Medical History:  Past Medical History:  Diagnosis Date  . Anxiety   . Depression   . GERD (gastroesophageal reflux disease)   . Hypercalcemia   . Pneumonia   . Sepsis ([place])   . Sleep apnea   . SOB (shortness of breath) on exertion     Past Surgical History:  Procedure Laterality Date  . ENDOMETRIAL ABLATION    . EYE SURGERY  2011    left cataract  . KNEE ARTHROSCOPY Left 05/03/2015   Procedure: left knee arthroscopy, partial medial menisectomy, chondroplasty;  Surgeon: Dereck Leep, MD;  Location: [facility] ORS;  Service: Orthopedics;  Laterality: Left;  . TONSILLECTOMY      Family Psychiatric History: I have reviewed family psychiatric history from progress note per Dr. Toy Care dated 01/04/2020  Family History:  Family History  Problem Relation Age of Onset  . Cancer Mother 85       ovarian   . Pernicious anemia Mother   . COPD Father        nonsmoker  . Cancer Cousin        ovarian ca    Social History: I have reviewed social history per progress note by Dr. Toy Care dated 01/04/2020 Social History   Socioeconomic History  . Marital status: Widowed    Spouse name: Not on file  . Number of children: 5  . Years of education: Not on file  . Highest education level: Not on file  Occupational History  . Occupation: retired  Tobacco Use  . Smoking status: Never Smoker  . Smokeless tobacco: Never Used  Vaping Use  . Vaping Use: Never used  Substance and Sexual Activity  . Alcohol use: No  . Drug use: No  . Sexual activity: Not Currently  Other Topics Concern  . Not on file  Social History Narrative   Regular exercise: not really   Caffeine use: some   Social Determinants of Health   Financial Resource Strain:   . Difficulty of Paying Living Expenses: Not on file  Food Insecurity:   . Worried About Charity fundraiser in the Last Year: Not on file  . Ran Out of Food in the Last Year: Not on file  Transportation Needs:   . Lack of Transportation (Medical): Not on file  . Lack of Transportation (Non-Medical): Not on file  Physical Activity:   . Days of Exercise per Week: Not on file  . Minutes of Exercise per Session: Not on file  Stress:   . Feeling of Stress : Not on file  Social Connections:   . Frequency of Communication with Friends and Family: Not on file  . Frequency of Social Gatherings with  Friends and Family: Not on file  . Attends Religious Services: Not on file  . Active Member of Clubs or Organizations: Not on file  . Attends Archivist Meetings: Not on file  . Marital Status: Not on file    Allergies:  Allergies  Allergen Reactions  . Codeine Other (See Comments)    Crying spells  . Darvon [Propoxyphene Hcl] Other (See Comments)    Crying spells  . Propoxyphene Other (See Comments)    Crying, altered mental status    Metabolic Disorder Labs: Lab Results  Component Value Date   HGBA1C 6.0 08/21/2017   No results found for:  PROLACTIN Lab Results  Component Value Date   CHOL 178 06/26/2015   TRIG 292.0 (H) 06/26/2015   HDL 41.90 06/26/2015   CHOLHDL 4 06/26/2015   VLDL 58.4 (H) 06/26/2015   LDLCALC 117 (H) 05/07/2013   LDLCALC 115 (H) 12/25/2011   Lab Results  Component Value Date   TSH 3.73 08/21/2017   TSH 2.62 05/07/2013    Therapeutic Level Labs: No results found for: LITHIUM No results found for: VALPROATE No components found for:  CBMZ  Current Medications: Current Outpatient Medications  Medication Sig Dispense Refill  . ARIPiprazole (ABILIFY) 2 MG tablet TAKE 1 TABLET(2 MG) BY MOUTH DAILY 90 tablet 0  . aspirin 325 MG EC tablet Take 325 mg by mouth every 6 (six) hours as needed for pain.    . busPIRone (BUSPAR) 5 MG tablet Take 1 tablet (5 mg total) by mouth 2 (two) times daily. 180 tablet 0  . Cholecalciferol (VITAMIN D-3 PO) Take 1 capsule by mouth daily. 500iu    . escitalopram (LEXAPRO) 10 MG tablet Take 1 tablet (10 mg total) by mouth daily. 90 tablet 0  . loperamide (IMODIUM) 2 MG capsule Take 2 mg by mouth 2 (two) times daily.    Marland Kitchen omeprazole (PRILOSEC) 20 MG capsule TAKE 1 CAPSULE BY MOUTH DAILY 90 capsule 1  . triamcinolone cream (KENALOG) 0.1 % Apply topically 2 (two) times daily. 454 g 1  . vitamin B-12 (CYANOCOBALAMIN) 1000 MCG tablet Take by mouth.    Marland Kitchen alendronate (FOSAMAX) 70 MG tablet TAKE 1 TABLET BY MOUTH  EVERY 7 DAYS WITH A FULL GLASS OF WATER. DO NOT LIE DOWN FOR THE NEXT 30 MINUTES (Patient not taking: Reported on 03/30/2020)     No current facility-administered medications for this visit.     Musculoskeletal: Strength & Muscle Tone: UTA Gait & Station: Observed as seated Patient leans: N/A  Psychiatric Specialty Exam: Review of Systems  Neurological: Positive for tremors.  Psychiatric/Behavioral:       Paranoia   All other systems reviewed and are negative.   There were no vitals taken for this visit.There is no height or weight on file to calculate BMI.  General Appearance: Casual  Eye Contact:  Fair  Speech:  Slow  Volume:  Normal  Mood:  Euthymic  Affect:  Flat  Thought Process:  Goal Directed and Descriptions of Associations: Intact  Orientation:  Other:  day, month, year,situation  Thought Content: Paranoid Ideation able to cope well  Suicidal Thoughts:  No  Homicidal Thoughts:  No  Memory:  Immediate;   Fair Recent;   Poor Remote;   Poor  Judgement:  Fair  Insight:  Fair  Psychomotor Activity:  Tremor  Concentration:  Concentration: Fair and Attention Span: Fair  Recall:  [employer] of Knowledge: Fair  Language: Fair  Akathisia:  No  Handed:  Right  AIMS (if indicated): UTA  Assets:  Communication Skills Housing Social Support  ADL's:  Intact  Cognition: Impaired,  Mild  Sleep:  Fair   Screenings: AIMS     Office Visit from 06/16/2017 in [facility] Office Visit from 02/07/2016 in [facility] Total Score 0 0    Mini-Mental     Office Visit from 10/09/2015 in [place]  Total Score (max 30 points ) 27    PHQ2-9     Office Visit from 09/18/2017 in [facility] Office Visit from 05/30/2016 in [place]  [place] Visit from 06/26/2015 in [facility] Office Visit from 10/27/2014 in [place] Office Visit from 05/07/2013 in [place]  PHQ-2 Total Score 0 0 0 0 0       Assessment and Plan: TWYLA DAIS is a 86 year old Caucasian female, lives in [place], has a history of MDD, dementia, anxiety disorder was evaluated by telemedicine today.  Patient is biologically predisposed given her history of medical problems.  Patient with psychosocial stressors of the current pandemic and her own health issues.  Patient currently does report paranoia however is not interested in medication changes and per collateral information obtained from daughter does not seem to be overtly affected by her current paranoia.  Discussed plan as noted below.  Plan MDD in full remission Lexapro 10 mg p.o. daily Abilify 2 mg p.o. daily BuSpar 5 mg p.o. twice daily  Dementia with behavioral disturbance-stable Patient has tried and failed medications like Namenda, Exelon-had side effects to them. She is not interested in any medication management for her memory problems at this time. She is not interested in referral to neurology. We will monitor closely. She has good social support system from her daughter.  High risk medication use-will order lipid panel, BMP, prolactin.  Patient will also benefit from vitamin B12 level however she is currently on vitamin B12 replacement daily.  She will have a discussion with her primary care provider.  At risk for QT syndrome-since she is on multiple medications like Abilify and Lexapro will order EKG.  Patient however will have this done at her primary care office.  Patient with history of tremors, likely chronic.  Unable to evaluate patient due to the virtual appointment.  Patient will talk to her primary care provider, she has upcoming appointment.  She will discuss the need for neurology referral.  Collateral information was obtained from daughter-Jean as noted above.  I have reviewed medical records in [facility] per Dr. Toy Care, Dr. Gretel Acre-  10/09/2015 - 01/04/2020.  Follow-up in clinic in 3 to 4 months or sooner if needed.  I have spent atleast 20 minutes  face to face with patient today. More than 50 % of the time was spent for preparing to see the patient ( e.g., review of test, records ), obtaining and to review and separately obtained history , ordering medications and test ,psychoeducation and supportive psychotherapy and care coordination,as well as documenting clinical information in electronic health record. This note was generated in part or whole with voice recognition software. Voice recognition is usually quite accurate but there are transcription errors that can and very often do occur. I apologize for any typographical errors that were not detected and corrected.        Ursula Alert, MD 03/30/2020, 4:08 PM

## 2020-03-30 NOTE — Patient Instructions (Signed)
No changes made to medications today.  We will recommend the following labs-lipid panel, prolactin, BMP.  Will recommend EKG of her heart to monitor her QTC since she is on multiple psychotropics.  Due to concerns of right-sided tremors, will recommend in person evaluation with neurologist.  But could talk to primary care provider at the upcoming appointment first.

## 2020-04-03 ENCOUNTER — Ambulatory Visit: Payer: Medicare Other | Admitting: Podiatry

## 2020-04-04 ENCOUNTER — Telehealth: Payer: Self-pay | Admitting: Family

## 2020-04-04 DIAGNOSIS — F0390 Unspecified dementia without behavioral disturbance: Secondary | ICD-10-CM

## 2020-04-04 NOTE — Telephone Encounter (Signed)
Yes we can see and draw those labs here :)

## 2020-04-04 NOTE — Telephone Encounter (Signed)
LM on daughter, Janna's answering machine to call back.

## 2020-04-04 NOTE — Telephone Encounter (Signed)
[name],   Can you see [name] labs ( PRL, lipid, BMP)- can you draw them in our offie?    [name],  Call pt and daughter if on DPR  Per [name]'s note: She needs:  b12 ordered.  I have ordered as well. We are trying to get this lab done when she has [name]'s done  She also needs EKG in our office as she is on multiple medications like Abilify and Lexapro   Please move up appt and put reason for appt, ' EKG and follow up' so we know to do ekg at visit.

## 2020-04-07 NOTE — Telephone Encounter (Signed)
noted 

## 2020-04-07 NOTE — Telephone Encounter (Signed)
Just FYI her patient's appointment is 10/1 & I have added note to to EKG.

## 2020-05-04 ENCOUNTER — Other Ambulatory Visit: Payer: Self-pay

## 2020-05-04 ENCOUNTER — Ambulatory Visit: Payer: Medicare Other | Admitting: Podiatry

## 2020-05-04 ENCOUNTER — Encounter: Payer: Self-pay | Admitting: Podiatry

## 2020-05-04 DIAGNOSIS — N183 Chronic kidney disease, stage 3 unspecified: Secondary | ICD-10-CM

## 2020-05-04 DIAGNOSIS — M79676 Pain in unspecified toe(s): Secondary | ICD-10-CM

## 2020-05-04 DIAGNOSIS — B351 Tinea unguium: Secondary | ICD-10-CM

## 2020-05-04 NOTE — Progress Notes (Signed)
This patient returns to my office for at risk foot care.  This patient requires this care by a professional since this patient will be at risk due to having chronic kidney disease.  This patient is unable to cut nails herself since the patient cannot reach her nails.These nails are painful walking and wearing shoes. Patient is accompanied by caregiver. This patient presents for at risk foot care today.  General Appearance  Alert, conversant and in no acute stress.  Vascular  Dorsalis pedis and posterior tibial  pulses are palpable  bilaterally.  Capillary return is within normal limits  bilaterally. Temperature is within normal limits  bilaterally.  Neurologic  Senn-Weinstein monofilament wire test within normal limits  bilaterally. Muscle power within normal limits bilaterally.  Nails Thick disfigured discolored nails with subungual debris  from hallux to fifth toes bilaterally. No evidence of bacterial infection or drainage bilaterally.  Orthopedic  No limitations of motion  feet .  No crepitus or effusions noted.  No bony pathology or digital deformities noted.  Skin  normotropic skin with no porokeratosis noted bilaterally.  No signs of infections or ulcers noted.     Onychomycosis  Pain in right toes  Pain in left toes  Consent was obtained for treatment procedures.   Mechanical debridement of nails 1-5  bilaterally performed with a nail nipper.  Filed with dremel without incident.    Return office visit     3 months                 Told patient to return for periodic foot care and evaluation due to potential at risk complications.   Gardiner Barefoot DPM

## 2020-05-05 DIAGNOSIS — H903 Sensorineural hearing loss, bilateral: Secondary | ICD-10-CM | POA: Diagnosis not present

## 2020-05-05 DIAGNOSIS — J300 Vasomotor rhinitis: Secondary | ICD-10-CM | POA: Diagnosis not present

## 2020-05-05 DIAGNOSIS — L03211 Cellulitis of face: Secondary | ICD-10-CM | POA: Diagnosis not present

## 2020-05-12 ENCOUNTER — Other Ambulatory Visit: Payer: Self-pay

## 2020-05-12 ENCOUNTER — Ambulatory Visit (INDEPENDENT_AMBULATORY_CARE_PROVIDER_SITE_OTHER): Payer: Medicare Other | Admitting: Family

## 2020-05-12 ENCOUNTER — Encounter: Payer: Self-pay | Admitting: Family

## 2020-05-12 VITALS — BP 118/52 | HR 89 | Temp 98.6°F | Ht 62.99 in | Wt 190.8 lb

## 2020-05-12 DIAGNOSIS — F329 Major depressive disorder, single episode, unspecified: Secondary | ICD-10-CM | POA: Diagnosis not present

## 2020-05-12 DIAGNOSIS — Z79899 Other long term (current) drug therapy: Secondary | ICD-10-CM

## 2020-05-12 DIAGNOSIS — M810 Age-related osteoporosis without current pathological fracture: Secondary | ICD-10-CM

## 2020-05-12 DIAGNOSIS — Z23 Encounter for immunization: Secondary | ICD-10-CM | POA: Diagnosis not present

## 2020-05-12 DIAGNOSIS — E210 Primary hyperparathyroidism: Secondary | ICD-10-CM

## 2020-05-12 DIAGNOSIS — R251 Tremor, unspecified: Secondary | ICD-10-CM | POA: Diagnosis not present

## 2020-05-12 DIAGNOSIS — G4733 Obstructive sleep apnea (adult) (pediatric): Secondary | ICD-10-CM

## 2020-05-12 DIAGNOSIS — F0390 Unspecified dementia without behavioral disturbance: Secondary | ICD-10-CM | POA: Diagnosis not present

## 2020-05-12 DIAGNOSIS — R197 Diarrhea, unspecified: Secondary | ICD-10-CM

## 2020-05-12 NOTE — Patient Instructions (Addendum)
Stop using aspirin for pain, please use tylenol arthritis.   Wean off prilosec , taking 20mg  every other day for couple weeks, then stop.   Long term use beyond 3 months of proton pump inhibitors , also called PPI's, is associated with malabsorption of vitamins, chronic kidney disease, fracture risk, and diarrheal illnesses. PPI's include Nexium, Prilosec, Protonix, Dexilant, and Prevacid.   I generally recommend trying to control acid reflux with lifestyle modifications including avoiding trigger foods, not eating 2 hours prior to bedtime. You may use histamine 2 blockers daily to twice daily ( this is Pepcid) and then when symptoms flare, start back on PPI for short course. PLEASE NOTE: Zantac is a histamine blocker like Pepcid and the FDA has requested that Zantac be taken OFF THE MARKET. Please do NOT take this medication as there are concerns that it is cancer causing.   Of note, we will need to do an endoscopy ( upper GI) to evaluate your esophagus, stomach in the future if acid reflux persists are you develop red flag symptoms: trouble swallowing, hoarseness, chronic cough, unexplained weight loss.    I will share your EKG with [name].   Stay safe.

## 2020-05-12 NOTE — Assessment & Plan Note (Addendum)
Stable, follows with psychiatry.  EKG shows SR , no significant changes from prior EKG done in 2016. No ischemia. QT 458, prior 456.  Dr Charlcie Cradle office is closed this afternoon so I have sent her a staff message that she is aware and can make medication changes if she feels necessary. EKG reviewed with supervising, Dr Deborra Medina, and she and I jointly agreed on management plan.

## 2020-05-12 NOTE — Assessment & Plan Note (Addendum)
Didn't tolerate fosamax. Declines dexa and declines treatment a this time; we discussed prolia, reclast as potential options after dexa. She will let me know if she changes her mind.

## 2020-05-12 NOTE — Assessment & Plan Note (Signed)
Very fine resting tremor right hand. Pending TSH, referral to neurology for further evaluation.

## 2020-05-12 NOTE — Progress Notes (Signed)
Subjective:    Patient ID: [name], female    DOB: 11-Jun-1932, 86 y.o.   MRN: [id number]  CC: [name] is a 86 y.o. female who presents today for follow up.   HPI: Accompanied by daughter who lives with her.  She doesn't drive.  Patient and daughter are not sure why they are here. No concerns today.  Primarily here for EKG and labs as advised by [name].  No cp, sob.  I asked them about tremor noted by [name]. Daughter describes tremor in right hand, fine shaking movement. Worse when nervous or tired.  1 cup coffee in the morning. No alcohol. Daughter noticed tremor this past year. Patient hasnt noticed tremor. Not worsening. Never seen neurology for this.   Using walker. No falls.   MDD with anxiety- compliant with abilify, buspar, lexapro.    Dementia- diagnosed by  Psychiatry years ago, unsure of date.  Dresses with assistance from daughter. Appetite is good.   OSA- compliant with cipap  GERD- compliant with prilosec. No trouble swallowing.   Chronic diarrhea-unchanged. has taken imodium daily for years with relief. H/o Cdiff.  takes asa 325mg  prn Follows with [name] for MDD HISTORY:  Past Medical History:  Diagnosis Date  . Anxiety   . Depression   . GERD (gastroesophageal reflux disease)   . Hypercalcemia   . Pneumonia   . Sepsis ([employer])   . Sleep apnea   . SOB (shortness of breath) on exertion    Past Surgical History:  Procedure Laterality Date  . ENDOMETRIAL ABLATION    . EYE SURGERY  2011   left cataract  . KNEE ARTHROSCOPY Left 05/03/2015   Procedure: left knee arthroscopy, partial medial menisectomy, chondroplasty;  Surgeon: Dereck Leep, MD;  Location: [facility] ORS;  Service: Orthopedics;  Laterality: Left;  . TONSILLECTOMY     Family History  Problem Relation Age of Onset  . Cancer Mother 38       ovarian   . Pernicious anemia Mother   . COPD Father        nonsmoker  . Cancer Cousin        ovarian ca    Allergies: Codeine, Darvon  [propoxyphene hcl], and Propoxyphene Current Outpatient Medications on File Prior to Visit  Medication Sig Dispense Refill  . ARIPiprazole (ABILIFY) 2 MG tablet TAKE 1 TABLET(2 MG) BY MOUTH DAILY 90 tablet 0  . busPIRone (BUSPAR) 5 MG tablet Take 1 tablet (5 mg total) by mouth 2 (two) times daily. 180 tablet 0  . Cholecalciferol (VITAMIN D-3 PO) Take 1 capsule by mouth daily. 500iu    . escitalopram (LEXAPRO) 10 MG tablet Take 1 tablet (10 mg total) by mouth daily. 90 tablet 0  . gentamicin ointment (GARAMYCIN) 0.1 % Apply topically 3 (three) times daily.    Marland Kitchen ipratropium (ATROVENT) 0.03 % nasal spray SMARTSIG:1-2 Spray(s) Both Nares 3 Times Daily PRN    . loperamide (IMODIUM) 2 MG capsule Take 2 mg by mouth 2 (two) times daily.    Marland Kitchen omeprazole (PRILOSEC) 20 MG capsule TAKE 1 CAPSULE BY MOUTH DAILY 90 capsule 1  . triamcinolone cream (KENALOG) 0.1 % Apply topically 2 (two) times daily. 454 g 1  . vitamin B-12 (CYANOCOBALAMIN) 1000 MCG tablet Take by mouth.     No current facility-administered medications on file prior to visit.    Social History   Tobacco Use  . Smoking status: Never Smoker  . Smokeless tobacco: Never Used  Vaping Use  . Vaping Use: Never used  Substance Use Topics  . Alcohol use: No  . Drug use: No    Review of Systems  Constitutional: Negative for chills and fever.  Respiratory: Negative for cough.   Cardiovascular: Negative for chest pain and palpitations.  Gastrointestinal: Negative for nausea and vomiting.  Neurological: Positive for tremors. Negative for weakness and headaches.  Psychiatric/Behavioral: Negative for sleep disturbance and suicidal ideas. The patient is not nervous/anxious.       Objective:    BP (!) 118/52 (BP Location: Left Arm, Patient Position: Sitting)   Pulse 89   Temp 98.6 F (37 C)   Ht 5' 2.99" (1.6 m)   Wt 190 lb 12.8 oz (86.5 kg)   SpO2 96%   BMI 33.81 kg/m  BP Readings from Last 3 Encounters:  05/12/20 (!) 118/52    06/26/18 118/70  01/26/18 122/70   Wt Readings from Last 3 Encounters:  05/12/20 190 lb 12.8 oz (86.5 kg)  06/26/18 194 lb 12.8 oz (88.4 kg)  01/26/18 195 lb (88.5 kg)    Physical Exam Vitals reviewed.  Constitutional:      Appearance: She is well-developed.  Eyes:     Conjunctiva/sclera: Conjunctivae normal.  Cardiovascular:     Rate and Rhythm: Normal rate and regular rhythm.     Pulses: Normal pulses.     Heart sounds: Normal heart sounds.  Pulmonary:     Effort: Pulmonary effort is normal.     Breath sounds: Normal breath sounds. No wheezing, rhonchi or rales.  Skin:    General: Skin is warm and dry.  Neurological:     Mental Status: She is alert.     Sensory: Sensation is intact.     Deep Tendon Reflexes:     Reflex Scores:      Bicep reflexes are 2+ on the right side and 2+ on the left side.    Comments: Fine tremor right hand when resting on arm rest. Resolved with intention, finger to nose test.  No cogwheeling.  Gait is steady, slow with walker but no shuffling noted.   Psychiatric:        Speech: Speech normal.        Behavior: Behavior normal.        Thought Content: Thought content normal.        Assessment & Plan:   Problem List Items Addressed This Visit      Respiratory   OSA (obstructive sleep apnea)    Compliant with cipap. Continue.         Endocrine   Primary hyperparathyroidism ([place])    Strongly advised to follow up with endocrine. Explained that depression may be a manifestation of parathyrid disease. Declines follow up with dr Gabriel Carina or repeat DEXA.        Nervous and Auditory   Dementia without behavioral disturbance ([place]) - Primary    No formal consult with neurology to date. Referral placed. Will follow      Relevant Orders   EKG 12-Lead   Prolactin (Completed)   B12 and Folate Panel (Completed)   Lipid panel (Completed)   Comprehensive metabolic panel (Completed)   TSH (Completed)   Ambulatory referral to Neurology   CBC  with Differential/Platelet (Completed)     Musculoskeletal and Integument   Osteoporosis    Didn't tolerate fosamax. Declines dexa and declines treatment a this time; we discussed prolia, reclast as potential options after dexa. She will let me know if  she changes her mind.        Other   Diarrhea    Chronic diarrhea. Unchanged. Imodium OTC daily works to control symptom. Will continue.       High risk medication use   Relevant Orders   EKG 12-Lead   CBC with Differential/Platelet (Completed)   Major depressive disorder with single episode    Stable, follows with psychiatry.  EKG shows SR , no significant changes from prior EKG done in 2016. No ischemia. QT 458, prior 456.  Dr Charlcie Cradle office is closed this afternoon so I have sent her a staff message that she is aware and can make medication changes if she feels necessary. EKG reviewed with supervising, Dr Deborra Medina, and she and I jointly agreed on management plan.       Tremor    Very fine resting tremor right hand. Pending TSH, referral to neurology for further evaluation.        Other Visit Diagnoses    Need for immunization against influenza       Relevant Orders   Flu Vaccine QUAD High Dose(Fluad) (Completed)       I have discontinued Marysa M. Wisler's aspirin and alendronate. I am also having her maintain her Cholecalciferol (VITAMIN D-3 PO), loperamide, triamcinolone cream, vitamin B-12, omeprazole, ARIPiprazole, busPIRone, escitalopram, ipratropium, and gentamicin ointment.   No orders of the defined types were placed in this encounter.   Return precautions given.   Risks, benefits, and alternatives of the medications and treatment plan prescribed today were discussed, and patient expressed understanding.   Education regarding symptom management and diagnosis given to patient on AVS.  Continue to follow with Burnard Hawthorne, FNP for routine health maintenance.   [name] and I agreed with plan.    [name], FNP

## 2020-05-12 NOTE — Assessment & Plan Note (Addendum)
Strongly advised to follow up with endocrine. Explained that depression may be a manifestation of parathyrid disease. Declines follow up with dr Gabriel Carina or repeat DEXA.

## 2020-05-12 NOTE — Assessment & Plan Note (Signed)
No formal consult with neurology to date. Referral placed. Will follow

## 2020-05-12 NOTE — Assessment & Plan Note (Signed)
Chronic diarrhea. Unchanged. Imodium OTC daily works to control symptom. Will continue.

## 2020-05-12 NOTE — Assessment & Plan Note (Signed)
Compliant with cipap. Continue.

## 2020-05-13 LAB — TSH: TSH: 3.68 mIU/L (ref 0.40–4.50)

## 2020-05-13 LAB — COMPREHENSIVE METABOLIC PANEL
AG Ratio: 1.6 (calc) (ref 1.0–2.5)
ALT: 14 U/L (ref 6–29)
AST: 17 U/L (ref 10–35)
Albumin: 4.2 g/dL (ref 3.6–5.1)
Alkaline phosphatase (APISO): 57 U/L (ref 37–153)
BUN/Creatinine Ratio: 13 (calc) (ref 6–22)
BUN: 15 mg/dL (ref 7–25)
CO2: 28 mmol/L (ref 20–32)
Calcium: 10.8 mg/dL — ABNORMAL HIGH (ref 8.6–10.4)
Chloride: 102 mmol/L (ref 98–110)
Creat: 1.17 mg/dL — ABNORMAL HIGH (ref 0.60–0.88)
Globulin: 2.6 g/dL (calc) (ref 1.9–3.7)
Glucose, Bld: 95 mg/dL (ref 65–99)
Potassium: 3.9 mmol/L (ref 3.5–5.3)
Sodium: 140 mmol/L (ref 135–146)
Total Bilirubin: 0.4 mg/dL (ref 0.2–1.2)
Total Protein: 6.8 g/dL (ref 6.1–8.1)

## 2020-05-13 LAB — PROLACTIN: Prolactin: 11.9 ng/mL

## 2020-05-13 LAB — CBC WITH DIFFERENTIAL/PLATELET
Absolute Monocytes: 527 cells/uL (ref 200–950)
Basophils Absolute: 52 cells/uL (ref 0–200)
Basophils Relative: 0.8 %
Eosinophils Absolute: 202 cells/uL (ref 15–500)
Eosinophils Relative: 3.1 %
HCT: 44.1 % (ref 35.0–45.0)
Hemoglobin: 14.8 g/dL (ref 11.7–15.5)
Lymphs Abs: 1976 cells/uL (ref 850–3900)
MCH: 29.4 pg (ref 27.0–33.0)
MCHC: 33.6 g/dL (ref 32.0–36.0)
MCV: 87.7 fL (ref 80.0–100.0)
MPV: 11.4 fL (ref 7.5–12.5)
Monocytes Relative: 8.1 %
Neutro Abs: 3744 cells/uL (ref 1500–7800)
Neutrophils Relative %: 57.6 %
Platelets: 191 10*3/uL (ref 140–400)
RBC: 5.03 10*6/uL (ref 3.80–5.10)
RDW: 12.8 % (ref 11.0–15.0)
Total Lymphocyte: 30.4 %
WBC: 6.5 10*3/uL (ref 3.8–10.8)

## 2020-05-13 LAB — LIPID PANEL
Cholesterol: 204 mg/dL — ABNORMAL HIGH (ref <200)
HDL: 40 mg/dL — ABNORMAL LOW (ref >=50)
LDL Cholesterol (Calc): 120 mg/dL (calc) — ABNORMAL HIGH
Non-HDL Cholesterol (Calc): 164 mg/dL (calc) — ABNORMAL HIGH (ref <130)
Total CHOL/HDL Ratio: 5.1 (calc) — ABNORMAL HIGH (ref <5.0)
Triglycerides: 284 mg/dL — ABNORMAL HIGH (ref <150)

## 2020-05-13 LAB — B12 AND FOLATE PANEL
Folate: 9.6 ng/mL
Vitamin B-12: 776 pg/mL (ref 200–1100)

## 2020-05-18 NOTE — Progress Notes (Signed)
I have faxed to Dr. Charlcie Cradle office.

## 2020-06-26 ENCOUNTER — Other Ambulatory Visit: Payer: Self-pay | Admitting: Psychiatry

## 2020-06-26 DIAGNOSIS — F3342 Major depressive disorder, recurrent, in full remission: Secondary | ICD-10-CM

## 2020-06-27 ENCOUNTER — Other Ambulatory Visit: Payer: Self-pay

## 2020-06-27 ENCOUNTER — Encounter: Payer: Self-pay | Admitting: Psychiatry

## 2020-06-27 ENCOUNTER — Telehealth (INDEPENDENT_AMBULATORY_CARE_PROVIDER_SITE_OTHER): Payer: Medicare Other | Admitting: Psychiatry

## 2020-06-27 DIAGNOSIS — Z79899 Other long term (current) drug therapy: Secondary | ICD-10-CM

## 2020-06-27 DIAGNOSIS — F3342 Major depressive disorder, recurrent, in full remission: Secondary | ICD-10-CM | POA: Diagnosis not present

## 2020-06-27 DIAGNOSIS — F0391 Unspecified dementia with behavioral disturbance: Secondary | ICD-10-CM

## 2020-06-27 DIAGNOSIS — Z9189 Other specified personal risk factors, not elsewhere classified: Secondary | ICD-10-CM | POA: Diagnosis not present

## 2020-06-27 MED ORDER — ESCITALOPRAM OXALATE 10 MG PO TABS: 10.0000 mg | ORAL_TABLET | Freq: Every day | ORAL | 3 refills | Status: DC

## 2020-06-27 MED ORDER — BUSPIRONE HCL 5 MG PO TABS: 5.0000 mg | ORAL_TABLET | Freq: Two times a day (BID) | ORAL | 3 refills | Status: DC

## 2020-06-27 NOTE — Progress Notes (Signed)
Virtual Visit via Video Note  I connected with [name] on 06/27/20 at 11:30 AM EST by a video enabled telemedicine application and verified that I am speaking with the correct person using two identifiers.  Location Provider Location : [facility] Patient Location : Home  Participants: Patient ,Daughter, Provider   I discussed the limitations of evaluation and management by telemedicine and the availability of in person appointments. The patient expressed understanding and agreed to proceed.  I discussed the assessment and treatment plan with the patient. The patient was provided an opportunity to ask questions and all were answered. The patient agreed with the plan and demonstrated an understanding of the instructions.  The patient was advised to call back or seek an in-person evaluation if the symptoms worsen or if the condition fails to improve as anticipated.   [place] MD OP Progress Note  06/27/2020 12:01 PM [name]  MRN:  [id number]  Chief Complaint:  Chief Complaint    Follow-up     HPI: [name] is a 86 year old Caucasian female who has a history of MDD, dementia, gastroesophageal reflux disease, obstructive sleep apnea compliant on CPAP, osteoporosis, chronic kidney disease stage III was evaluated by telemedicine today.  Patient being a limited historian-daughter provided information during the session.  Patient appeared to be alert, was oriented to person, situation.  She could not give a lot of information and had to be prompted for answers.  She however was able to give the year, was able to do serial 3 's , was able to give her date of birth, cell phone number.  According to daughter, patient continues to have paranoia especially when she is in the shower when she feels people in the TV are watching her.  She currently uses hearing aid and has been asking her questions about what she hears around the home.  She continues to have limited memory and does have  episodes of confusion.  She however does have upcoming appointment with neurology coming up.  She is compliant on medications as prescribed.  She does have tremors which has been chronic.  She is not interested in medication changes today and wants to follow-up with neurology before making further changes.  Patient denies any other concerns today.  Visit Diagnosis:    ICD-10-CM   1. MDD (major depressive disorder), recurrent, in full remission ([place])  F33.42 escitalopram (LEXAPRO) 10 MG tablet  2. Dementia with behavioral disturbance, unspecified dementia type ([facility])  F03.91 busPIRone (BUSPAR) 5 MG tablet    escitalopram (LEXAPRO) 10 MG tablet  3. High risk medication use  Z79.899   4. At risk for prolonged QT interval syndrome  Z91.89     Past Psychiatric History: I have reviewed past psychiatric history from my progress note on 01/04/2020  Past Medical History:  Past Medical History:  Diagnosis Date  . Anxiety   . Depression   . GERD (gastroesophageal reflux disease)   . Hypercalcemia   . Pneumonia   . Sepsis ([place])   . Sleep apnea   . SOB (shortness of breath) on exertion     Past Surgical History:  Procedure Laterality Date  . ENDOMETRIAL ABLATION    . EYE SURGERY  2011   left cataract  . KNEE ARTHROSCOPY Left 05/03/2015   Procedure: left knee arthroscopy, partial medial menisectomy, chondroplasty;  Surgeon: Dereck Leep, MD;  Location: [facility] ORS;  Service: Orthopedics;  Laterality: Left;  . TONSILLECTOMY      Family Psychiatric  History: Reviewed family psychiatric history from my progress note from 01/04/2020.  Family History:  Family History  Problem Relation Age of Onset  . Cancer Mother 52       ovarian   . Pernicious anemia Mother   . COPD Father        nonsmoker  . Cancer Cousin        ovarian ca    Social History: Reviewed social history from my progress note on 01/04/2020. Social History   Socioeconomic History  . Marital status: Widowed    Spouse name:  Not on file  . Number of children: 5  . Years of education: Not on file  . Highest education level: Not on file  Occupational History  . Occupation: retired  Tobacco Use  . Smoking status: Never Smoker  . Smokeless tobacco: Never Used  Vaping Use  . Vaping Use: Never used  Substance and Sexual Activity  . Alcohol use: No  . Drug use: No  . Sexual activity: Not Currently  Other Topics Concern  . Not on file  Social History Narrative   Regular exercise: not really   Caffeine use: some   Social Determinants of Health   Financial Resource Strain:   . Difficulty of Paying Living Expenses: Not on file  Food Insecurity:   . Worried About Charity fundraiser in the Last Year: Not on file  . Ran Out of Food in the Last Year: Not on file  Transportation Needs:   . Lack of Transportation (Medical): Not on file  . Lack of Transportation (Non-Medical): Not on file  Physical Activity:   . Days of Exercise per Week: Not on file  . Minutes of Exercise per Session: Not on file  Stress:   . Feeling of Stress : Not on file  Social Connections:   . Frequency of Communication with Friends and Family: Not on file  . Frequency of Social Gatherings with Friends and Family: Not on file  . Attends Religious Services: Not on file  . Active Member of Clubs or Organizations: Not on file  . Attends Archivist Meetings: Not on file  . Marital Status: Not on file    Allergies:  Allergies  Allergen Reactions  . Codeine Other (See Comments)    Crying spells  . Darvon [Propoxyphene Hcl] Other (See Comments)    Crying spells  . Propoxyphene Other (See Comments)    Crying, altered mental status    Metabolic Disorder Labs: Lab Results  Component Value Date   HGBA1C 6.0 08/21/2017   Lab Results  Component Value Date   PROLACTIN 11.9 05/12/2020   Lab Results  Component Value Date   CHOL 204 (H) 05/12/2020   TRIG 284 (H) 05/12/2020   HDL 40 (L) 05/12/2020   CHOLHDL 5.1 (H)  05/12/2020   VLDL 58.4 (H) 06/26/2015   LDLCALC 120 (H) 05/12/2020   LDLCALC 117 (H) 05/07/2013   Lab Results  Component Value Date   TSH 3.68 05/12/2020   TSH 3.73 08/21/2017    Therapeutic Level Labs: No results found for: LITHIUM No results found for: VALPROATE No components found for:  CBMZ  Current Medications: Current Outpatient Medications  Medication Sig Dispense Refill  . ARIPiprazole (ABILIFY) 2 MG tablet TAKE 1 TABLET(2 MG) BY MOUTH DAILY 90 tablet 0  . busPIRone (BUSPAR) 5 MG tablet Take 1 tablet (5 mg total) by mouth 2 (two) times daily. 180 tablet 3  . Cholecalciferol (VITAMIN D-3 PO) Take  1 capsule by mouth daily. 500iu    . escitalopram (LEXAPRO) 10 MG tablet Take 1 tablet (10 mg total) by mouth daily. 90 tablet 3  . gentamicin ointment (GARAMYCIN) 0.1 % Apply topically 3 (three) times daily.    Marland Kitchen ipratropium (ATROVENT) 0.03 % nasal spray SMARTSIG:1-2 Spray(s) Both Nares 3 Times Daily PRN    . loperamide (IMODIUM) 2 MG capsule Take 2 mg by mouth 2 (two) times daily.    Marland Kitchen triamcinolone cream (KENALOG) 0.1 % Apply topically 2 (two) times daily. 454 g 1  . vitamin B-12 (CYANOCOBALAMIN) 1000 MCG tablet Take by mouth.     No current facility-administered medications for this visit.     Musculoskeletal: Strength & Muscle Tone: UTA Gait & Station: UTA Patient leans: N/A  Psychiatric Specialty Exam: Review of Systems  Unable to perform ROS: Dementia    There were no vitals taken for this visit.There is no height or weight on file to calculate BMI.  General Appearance: Casual  Eye Contact:  Fair  Speech:  Slow  Volume:  Decreased  Mood:  Euthymic  Affect:  Blunt  Thought Process:  Goal Directed and Descriptions of Associations: Intact  Orientation:  Other:  Year  Thought Content: Paranoid Ideation   Suicidal Thoughts:  No  Homicidal Thoughts:  No  Memory:  Immediate;   Fair Recent;   limited Remote;   Poor  Judgement:  Impaired  Insight:  Shallow   Psychomotor Activity:  Tremor  Concentration:  Concentration: Fair and Attention Span: Fair  Recall:  Poor  Fund of Knowledge: Fair  Language: Fair  Akathisia:  No  Handed:  Right  AIMS (if indicated):UTA  Assets:  Communication Skills Desire for Improvement Housing Social Support  ADL's:  Intact  Cognition: WNL  Sleep:  Fair   Screenings: AIMS     Office Visit from 06/16/2017 in [facility] Office Visit from 02/07/2016 in [facility] Total Score 0 0    Mini-Mental     Office Visit from 10/09/2015 in [facility]  Total Score (max 30 points ) 27    PHQ2-9     Office Visit from 05/12/2020 in [facility] Office Visit from 09/18/2017 in [facility] Office Visit from 05/30/2016 in [facility] Office Visit from 06/26/2015 in [facility] Office Visit from 10/27/2014 in [facility]  PHQ-2 Total Score 0 0 0 0 0       Assessment and Plan: [name] is a 86 year old Caucasian female, lives in [place], has a history of MDD, dementia, anxiety disorder was evaluated by telemedicine today.  Patient is biologically predisposed given her history of medical problems.  Patient with psychosocial stressors of the current pandemic and her own health issues.  Patient continues to have perceptual disturbances, paranoia however otherwise is doing okay.  She is not interested in medication changes at this time.  Plan MDD in full remission Lexapro 10 mg p.o. daily Abilify 2 mg p.o. daily BuSpar 5 mg p.o. twice daily  Dementia with behavioral disturbances-stable Patient has been referred for neurology consult by primary care provider.  Patient also with tremors will need benefit from neurology consultation.  She has upcoming appointment.  I have reviewed the following labs in [facility] and discussed with patient-dated  05/12/2020-TSH-within normal limits, lipid panel-elevated-she will continue to follow-up with primary care provider for management, CMP-creatinine elevated at 1.17, calcium elevated  at 10.8-patient was referred to endocrinology for parathyroidism which could be contributing to it, vitamin B12 and folate within normal limits. Reviewed EKG dated 05/12/2020-within normal limits.  Follow-up in clinic in 4 to 5 months or sooner if needed.  I have spent atleast 20 minutes face to face by video with patient today. More than 50 % of the time was spent for preparing to see the patient ( e.g., review of test, records ), obtaining and to review and separately obtained history , ordering medications and test ,psychoeducation and supportive psychotherapy and care coordination,as well as documenting clinical information in electronic health record,interpreting and communication of test results This note was generated in part or whole with voice recognition software. Voice recognition is usually quite accurate but there are transcription errors that can and very often do occur. I apologize for any typographical errors that were not detected and corrected.     Ursula Alert, MD 06/27/2020, 12:01 PM

## 2020-06-28 ENCOUNTER — Ambulatory Visit (INDEPENDENT_AMBULATORY_CARE_PROVIDER_SITE_OTHER): Payer: Medicare Other

## 2020-06-28 VITALS — Ht 62.99 in | Wt 190.0 lb

## 2020-06-28 DIAGNOSIS — Z0000 Encounter for general adult medical examination without abnormal findings: Secondary | ICD-10-CM

## 2020-06-28 NOTE — Progress Notes (Addendum)
Subjective:   [name] is a 86 y.o. female who presents for Medicare Annual (Subsequent) preventive examination.  Review of Systems    No ROS.  Medicare Wellness Virtual Visit.           Objective:    Today's Vitals   06/28/20 1133  Weight: 190 lb (86.2 kg)  Height: 5' 2.99" (1.6 m)   Body mass index is 33.67 kg/m.  Advanced Directives 06/28/2020 06/28/2019 06/26/2018 04/21/2015  Does Patient Have a Medical Advance Directive? Yes Yes Yes Yes  Type of Paramedic of [place];Living will [place];Living will [place];Living will [place];Living will  Does patient want to make changes to medical advance directive? No - Patient declined No - Patient declined No - Patient declined -  Copy of [place] in Chart? Yes - validated most recent copy scanned in chart (See row information) Yes - validated most recent copy scanned in chart (See row information) Yes - validated most recent copy scanned in chart (See row information) -  Some encounter information is confidential and restricted. Go to Review Flowsheets activity to see all data.    Current Medications (verified) Outpatient Encounter Medications as of 06/28/2020  Medication Sig   ARIPiprazole (ABILIFY) 2 MG tablet TAKE 1 TABLET(2 MG) BY MOUTH DAILY   busPIRone (BUSPAR) 5 MG tablet Take 1 tablet (5 mg total) by mouth 2 (two) times daily.   Cholecalciferol (VITAMIN D-3 PO) Take 1 capsule by mouth daily. 500iu   escitalopram (LEXAPRO) 10 MG tablet Take 1 tablet (10 mg total) by mouth daily.   gentamicin ointment (GARAMYCIN) 0.1 % Apply topically 3 (three) times daily.   ipratropium (ATROVENT) 0.03 % nasal spray SMARTSIG:1-2 Spray(s) Both Nares 3 Times Daily PRN   loperamide (IMODIUM) 2 MG capsule Take 2 mg by mouth 2 (two) times daily.   triamcinolone cream (KENALOG) 0.1 % Apply topically 2 (two) times daily.    vitamin B-12 (CYANOCOBALAMIN) 1000 MCG tablet Take by mouth.   No facility-administered encounter medications on file as of 06/28/2020.    Allergies (verified) Codeine, Darvon [propoxyphene hcl], and Propoxyphene   History: Past Medical History:  Diagnosis Date   Anxiety    Depression    GERD (gastroesophageal reflux disease)    Hypercalcemia    Pneumonia    Sepsis ([place])    Sleep apnea    SOB (shortness of breath) on exertion    Past Surgical History:  Procedure Laterality Date   ENDOMETRIAL ABLATION     EYE SURGERY  2011   left cataract   KNEE ARTHROSCOPY Left 05/03/2015   Procedure: left knee arthroscopy, partial medial menisectomy, chondroplasty;  Surgeon: Dereck Leep, MD;  Location: [facility] ORS;  Service: Orthopedics;  Laterality: Left;   TONSILLECTOMY     Family History  Problem Relation Age of Onset   Cancer Mother 69       ovarian    Pernicious anemia Mother    COPD Father        nonsmoker   Cancer Cousin        ovarian ca   Social History   Socioeconomic History   Marital status: Widowed    Spouse name: Not on file   Number of children: 5   Years of education: Not on file   Highest education level: Not on file  Occupational History   Occupation: retired  Tobacco Use   Smoking status: Never Smoker  Smokeless tobacco: Never Used  Vaping Use   Vaping Use: Never used  Substance and Sexual Activity   Alcohol use: No   Drug use: No   Sexual activity: Not Currently  Other Topics Concern   Not on file  Social History Narrative   Regular exercise: not really   Caffeine use: some   Social Determinants of Health   Financial Resource Strain: Low Risk    Difficulty of Paying Living Expenses: Not hard at all  Food Insecurity: No Food Insecurity   Worried About Charity fundraiser in the Last Year: Never true   Ran Out of Food in the Last Year: Never true  Transportation Needs: No Transportation Needs   Lack of  Transportation (Medical): No   Lack of Transportation (Non-Medical): No  Physical Activity: Unknown   Days of Exercise per Week: 0 days   Minutes of Exercise per Session: Not on file  Stress: No Stress Concern Present   Feeling of Stress : Not at all  Social Connections: Unknown   Frequency of Communication with Friends and Family: Not on file   Frequency of Social Gatherings with Friends and Family: More than three times a week   Attends Religious Services: Not on file   Active Member of Clubs or Organizations: Not on file   Attends Archivist Meetings: Not on file   Marital Status: Widowed    Tobacco Counseling Counseling given: Not Answered   Clinical Intake:  Pre-visit preparation completed: Yes           How often do you need to have someone help you when you read instructions, pamphlets, or other written materials from your doctor or pharmacy?: 3 - Sometimes   Interpreter Needed?: No      Activities of Daily Living In your present state of health, do you have any difficulty performing the following activities: 06/28/2020  Hearing? Y  Vision? N  Difficulty concentrating or making decisions? N  Walking or climbing stairs? N  Dressing or bathing? N  Doing errands, shopping? Y  Preparing Food and eating ? Y  Using the Toilet? N  In the past six months, have you accidently leaked urine? Y  Comment Managed with daily brief  Do you have problems with loss of bowel control? Y  Comment Managed with daily depend  Managing your Medications? N  Managing your Finances? N  Housekeeping or managing your Housekeeping? N  Some recent data might be hidden    Patient Care Team: Burnard Hawthorne, FNP as PCP - General (Family Medicine)  Indicate any recent Medical Services you may have received from other than [facility] providers in the past year (date may be approximate).     Assessment:   This is a routine wellness examination for [name].  I  connected with [name] today by telephone and verified that I am speaking with the correct person using two identifiers. Location patient: home Location provider: work Persons participating in the virtual visit: patient, daughter, Marine scientist.    I discussed the limitations, risks, security and privacy concerns of performing an evaluation and management service by telephone and the availability of in person appointments. The patient expressed understanding and verbally consented to this telephonic visit.    Interactive audio and video telecommunications were attempted between this provider and patient, however failed, due to patient having technical difficulties OR patient did not have access to video capability.  We continued and completed visit with audio only.  Some vital signs  may be absent or patient reported.   Hearing/Vision screen  Hearing Screening   125Hz  250Hz  500Hz  1000Hz  2000Hz  3000Hz  4000Hz  6000Hz  8000Hz   Right ear:           Left ear:           Comments: Hearing aids  Vision Screening Comments: Followed by Dr. Ellin Mayhew  Wears reading glasses  Annual visits   Dietary issues and exercise activities discussed:    Healthy diet Good water intake  Goals     Healthy Lifestyle     Stay active as possible Eat healthy Stay hydrated      Depression Screen PHQ 2/9 Scores 06/28/2020 05/12/2020 06/28/2019 06/26/2018 09/18/2017 05/30/2016 06/26/2015  PHQ - 2 Score 0 0 - - 0 0 0  Exception Documentation - Medical reason Other- indicate reason in comment box Other- indicate reason in comment box - - -    Fall Risk Fall Risk  06/28/2020 05/12/2020 06/28/2019 06/26/2018 09/18/2017  Falls in the past year? 0 0 0 0 No  Number falls in past yr: 0 0 0 - -  Injury with Fall? 0 0 - - -  Risk for fall due to : - - - - -  Follow up Falls evaluation completed Falls evaluation completed Falls prevention discussed - -   Handrails in use when climbing stairs? Yes Home free of loose throw rugs in  walkways, pet beds, electrical cords, etc? Yes  Adequate lighting in your home to reduce risk of falls? Yes   ASSISTIVE DEVICES UTILIZED TO PREVENT FALLS: Life alert? Yes  Use of a cane, walker or w/c? Yes , walker in use.  Grab bars in the bathroom? Yes  Shower chair or bench in shower? Yes  Elevated toilet seat or a handicapped toilet? Yes    TIMED UP AND GO: Was the test performed? No . Virtual visit.   Cognitive Function: Dementia. Followed by Neurology.   MMSE - Mini Mental State Exam 06/28/2020 10/09/2015  Not completed: Unable to complete -  Registration-comments - 3  Some encounter information is confidential and restricted. Go to Review Flowsheets activity to see all data.     6CIT Screen 06/26/2018  What Year? 0 points  What month? 0 points  What time? 0 points  Count back from 20 0 points  Months in reverse 0 points  Repeat phrase 0 points  Total Score 0    Immunizations Immunization History  Administered Date(s) Administered   Fluad Quad(high Dose 65+) 05/12/2020   Influenza Split 06/24/2014, 06/12/2015   Influenza, High Dose Seasonal PF 05/30/2016, 05/09/2017, 06/12/2018   Influenza,inj,Quad PF,6+ Mos 05/07/2013   Influenza-Unspecified 05/09/2017   PFIZER SARS-COV-2 Vaccination 09/09/2019, 09/26/2019   Pneumococcal Conjugate-13 06/26/2015   Pneumococcal Polysaccharide-23 12/28/2010, 11/02/2012   TDAP status: Due, Education has been provided regarding the importance of this vaccine. Advised may receive this vaccine at local pharmacy or Health Dept. Aware to provide a copy of the vaccination record if obtained from local pharmacy or Health Dept. Verbalized acceptance and understanding.  Deferred.   Health Maintenance Health Maintenance  Topic Date Due   TETANUS/TDAP  06/28/2021 (Originally 03/14/1951)   INFLUENZA VACCINE  Completed   DEXA SCAN  Completed   COVID-19 Vaccine  Completed   PNA vac Low Risk Adult  Completed   Colorectal cancer  screening: No longer required.    Mammogram status: No longer required.    Bone Density- Vitamin D3 500 daily. Osteoporosis. Following with Dr. Gabriel Carina.   Lung  Cancer Screening: (Low Dose CT Chest recommended if Age 33-80 years, 30 pack-year currently smoking OR have quit w/in 15years.) does not qualify.   Hepatitis C Screening: does not qualify.  Dental Screening: Recommended annual dental exams for proper oral hygiene  Community Resource Referral / Chronic Care Management: CRR required this visit?  No   CCM required this visit?  No      Plan:   Keep all routine maintenance appointments.   Follow up 08/14/20 @ 11:00  I have personally reviewed and noted the following in the patients chart:    Medical and social history  Use of alcohol, tobacco or illicit drugs   Current medications and supplements  Functional ability and status  Nutritional status  Physical activity  Advanced directives  List of other physicians  Hospitalizations, surgeries, and ER visits in previous 12 months  Vitals  Screenings to include cognitive, depression, and falls  Referrals and appointments  In addition, I have reviewed and discussed with patient certain preventive protocols, quality metrics, and best practice recommendations. A written personalized care plan for preventive services as well as general preventive health recommendations were provided to patient via mychart.     Varney Biles, LPN   [id number]     Agree with plan. [name], NP

## 2020-06-28 NOTE — Patient Instructions (Addendum)
[name] , Thank you for taking time to come for your Medicare Wellness Visit. I appreciate your ongoing commitment to your health goals. Please review the following plan we discussed and let me know if I can assist you in the future.   These are the goals we discussed: Goals    . Healthy Lifestyle     Stay active as possible Eat healthy Stay hydrated       This is a list of the screening recommended for you and due dates:  Health Maintenance  Topic Date Due  . Tetanus Vaccine  06/28/2021*  . Flu Shot  Completed  . DEXA scan (bone density measurement)  Completed  . COVID-19 Vaccine  Completed  . Pneumonia vaccines  Completed  *Topic was postponed. The date shown is not the original due date.   Immunizations Immunization History  Administered Date(s) Administered  . Fluad Quad(high Dose 65+) 05/12/2020  . Influenza Split 06/24/2014, 06/12/2015  . Influenza, High Dose Seasonal PF 05/30/2016, 05/09/2017, 06/12/2018  . Influenza,inj,Quad PF,6+ Mos 05/07/2013  . Influenza-Unspecified 05/09/2017  . PFIZER SARS-COV-2 Vaccination 09/09/2019, 09/26/2019  . Pneumococcal Conjugate-13 06/26/2015  . Pneumococcal Polysaccharide-23 12/28/2010, 11/02/2012   Keep all routine maintenance appointments.   Follow up 08/14/20 @ 11:00  Advanced directives: completed, on file  Conditions/risks identified: none new  Follow up in one year for your annual wellness visit    Preventive Care 65 Years and Older, Female Preventive care refers to lifestyle choices and visits with your health care provider that can promote health and wellness. What does preventive care include?  A yearly physical exam. This is also called an annual well check.  Dental exams once or twice a year.  Routine eye exams. Ask your health care provider how often you should have your eyes checked.  Personal lifestyle choices, including:  Daily care of your teeth and gums.  Regular physical activity.  Eating a  healthy diet.  Avoiding tobacco and drug use.  Limiting alcohol use.  Practicing safe sex.  Taking low-dose aspirin every day.  Taking vitamin and mineral supplements as recommended by your health care provider. What happens during an annual well check? The services and screenings done by your health care provider during your annual well check will depend on your age, overall health, lifestyle risk factors, and family history of disease. Counseling  Your health care provider may ask you questions about your:  Alcohol use.  Tobacco use.  Drug use.  Emotional well-being.  Home and relationship well-being.  Sexual activity.  Eating habits.  History of falls.  Memory and ability to understand (cognition).  Work and work Statistician.  Reproductive health. Screening  You may have the following tests or measurements:  Height, weight, and BMI.  Blood pressure.  Lipid and cholesterol levels. These may be checked every 5 years, or more frequently if you are over 39 years old.  Skin check.  Lung cancer screening. You may have this screening every year starting at age 83 if you have a 30-pack-year history of smoking and currently smoke or have quit within the past 15 years.  Fecal occult blood test (FOBT) of the stool. You may have this test every year starting at age 43.  Flexible sigmoidoscopy or colonoscopy. You may have a sigmoidoscopy every 5 years or a colonoscopy every 10 years starting at age 74.  Hepatitis C blood test.  Hepatitis B blood test.  Sexually transmitted disease (STD) testing.  Diabetes screening. This is done by  checking your blood sugar (glucose) after you have not eaten for a while (fasting). You may have this done every 1-3 years.  Bone density scan. This is done to screen for osteoporosis. You may have this done starting at age 25.  Mammogram. This may be done every 1-2 years. Talk to your health care provider about how often you should  have regular mammograms. Talk with your health care provider about your test results, treatment options, and if necessary, the need for more tests. Vaccines  Your health care provider may recommend certain vaccines, such as:  Influenza vaccine. This is recommended every year.  Tetanus, diphtheria, and acellular pertussis (Tdap, Td) vaccine. You may need a Td booster every 10 years.  Zoster vaccine. You may need this after age 67.  Pneumococcal 13-valent conjugate (PCV13) vaccine. One dose is recommended after age 8.  Pneumococcal polysaccharide (PPSV23) vaccine. One dose is recommended after age 22. Talk to your health care provider about which screenings and vaccines you need and how often you need them. This information is not intended to replace advice given to you by your health care provider. Make sure you discuss any questions you have with your health care provider. Document Released: 08/25/2015 Document Revised: 04/17/2016 Document Reviewed: 05/30/2015 Elsevier Interactive Patient Education  2017 [place] Prevention in the Home Falls can cause injuries. They can happen to people of all ages. There are many things you can do to make your home safe and to help prevent falls. What can I do on the outside of my home?  Regularly fix the edges of walkways and driveways and fix any cracks.  Remove anything that might make you trip as you walk through a door, such as a raised step or threshold.  Trim any bushes or trees on the path to your home.  Use bright outdoor lighting.  Clear any walking paths of anything that might make someone trip, such as rocks or tools.  Regularly check to see if handrails are loose or broken. Make sure that both sides of any steps have handrails.  Any raised decks and porches should have guardrails on the edges.  Have any leaves, snow, or ice cleared regularly.  Use sand or salt on walking paths during winter.  Clean up any spills in  your garage right away. This includes oil or grease spills. What can I do in the bathroom?  Use night lights.  Install grab bars by the toilet and in the tub and shower. Do not use towel bars as grab bars.  Use non-skid mats or decals in the tub or shower.  If you need to sit down in the shower, use a plastic, non-slip stool.  Keep the floor dry. Clean up any water that spills on the floor as soon as it happens.  Remove soap buildup in the tub or shower regularly.  Attach bath mats securely with double-sided non-slip rug tape.  Do not have throw rugs and other things on the floor that can make you trip. What can I do in the bedroom?  Use night lights.  Make sure that you have a light by your bed that is easy to reach.  Do not use any sheets or blankets that are too big for your bed. They should not hang down onto the floor.  Have a firm chair that has side arms. You can use this for support while you get dressed.  Do not have throw rugs and other things on the  floor that can make you trip. What can I do in the kitchen?  Clean up any spills right away.  Avoid walking on wet floors.  Keep items that you use a lot in easy-to-reach places.  If you need to reach something above you, use a strong step stool that has a grab bar.  Keep electrical cords out of the way.  Do not use floor polish or wax that makes floors slippery. If you must use wax, use non-skid floor wax.  Do not have throw rugs and other things on the floor that can make you trip. What can I do with my stairs?  Do not leave any items on the stairs.  Make sure that there are handrails on both sides of the stairs and use them. Fix handrails that are broken or loose. Make sure that handrails are as long as the stairways.  Check any carpeting to make sure that it is firmly attached to the stairs. Fix any carpet that is loose or worn.  Avoid having throw rugs at the top or bottom of the stairs. If you do have  throw rugs, attach them to the floor with carpet tape.  Make sure that you have a light switch at the top of the stairs and the bottom of the stairs. If you do not have them, ask someone to add them for you. What else can I do to help prevent falls?  Wear shoes that:  Do not have high heels.  Have rubber bottoms.  Are comfortable and fit you well.  Are closed at the toe. Do not wear sandals.  If you use a stepladder:  Make sure that it is fully opened. Do not climb a closed stepladder.  Make sure that both sides of the stepladder are locked into place.  Ask someone to hold it for you, if possible.  Clearly mark and make sure that you can see:  Any grab bars or handrails.  First and last steps.  Where the edge of each step is.  Use tools that help you move around (mobility aids) if they are needed. These include:  Canes.  Walkers.  Scooters.  Crutches.  Turn on the lights when you go into a dark area. Replace any light bulbs as soon as they burn out.  Set up your furniture so you have a clear path. Avoid moving your furniture around.  If any of your floors are uneven, fix them.  If there are any pets around you, be aware of where they are.  Review your medicines with your doctor. Some medicines can make you feel dizzy. This can increase your chance of falling. Ask your doctor what other things that you can do to help prevent falls. This information is not intended to replace advice given to you by your health care provider. Make sure you discuss any questions you have with your health care provider. Document Released: 05/25/2009 Document Revised: 01/04/2016 Document Reviewed: 09/02/2014 Elsevier Interactive Patient Education  2017 [employer].

## 2020-07-03 DIAGNOSIS — R2689 Other abnormalities of gait and mobility: Secondary | ICD-10-CM | POA: Diagnosis not present

## 2020-07-03 DIAGNOSIS — R413 Other amnesia: Secondary | ICD-10-CM | POA: Diagnosis not present

## 2020-07-03 DIAGNOSIS — E519 Thiamine deficiency, unspecified: Secondary | ICD-10-CM | POA: Diagnosis not present

## 2020-07-10 ENCOUNTER — Other Ambulatory Visit: Payer: Self-pay | Admitting: Neurology

## 2020-07-10 ENCOUNTER — Other Ambulatory Visit (HOSPITAL_COMMUNITY): Payer: Self-pay | Admitting: Neurology

## 2020-07-10 DIAGNOSIS — R413 Other amnesia: Secondary | ICD-10-CM

## 2020-07-13 ENCOUNTER — Ambulatory Visit: Payer: Medicare Other

## 2020-07-14 DIAGNOSIS — F32A Depression, unspecified: Secondary | ICD-10-CM | POA: Diagnosis not present

## 2020-07-14 DIAGNOSIS — N189 Chronic kidney disease, unspecified: Secondary | ICD-10-CM | POA: Diagnosis not present

## 2020-07-14 DIAGNOSIS — I349 Nonrheumatic mitral valve disorder, unspecified: Secondary | ICD-10-CM | POA: Diagnosis not present

## 2020-07-14 DIAGNOSIS — I519 Heart disease, unspecified: Secondary | ICD-10-CM | POA: Diagnosis not present

## 2020-07-14 DIAGNOSIS — G309 Alzheimer's disease, unspecified: Secondary | ICD-10-CM | POA: Diagnosis not present

## 2020-07-14 DIAGNOSIS — E538 Deficiency of other specified B group vitamins: Secondary | ICD-10-CM | POA: Diagnosis not present

## 2020-07-14 DIAGNOSIS — R443 Hallucinations, unspecified: Secondary | ICD-10-CM | POA: Diagnosis not present

## 2020-07-14 DIAGNOSIS — K219 Gastro-esophageal reflux disease without esophagitis: Secondary | ICD-10-CM | POA: Diagnosis not present

## 2020-07-14 DIAGNOSIS — M810 Age-related osteoporosis without current pathological fracture: Secondary | ICD-10-CM | POA: Diagnosis not present

## 2020-07-14 DIAGNOSIS — Z9181 History of falling: Secondary | ICD-10-CM | POA: Diagnosis not present

## 2020-07-14 DIAGNOSIS — M1990 Unspecified osteoarthritis, unspecified site: Secondary | ICD-10-CM | POA: Diagnosis not present

## 2020-07-14 DIAGNOSIS — F0151 Vascular dementia with behavioral disturbance: Secondary | ICD-10-CM | POA: Diagnosis not present

## 2020-07-14 DIAGNOSIS — R2689 Other abnormalities of gait and mobility: Secondary | ICD-10-CM | POA: Diagnosis not present

## 2020-07-14 DIAGNOSIS — F0281 Dementia in other diseases classified elsewhere with behavioral disturbance: Secondary | ICD-10-CM | POA: Diagnosis not present

## 2020-07-20 ENCOUNTER — Ambulatory Visit
Admission: RE | Admit: 2020-07-20 | Discharge: 2020-07-20 | Disposition: A | Discharge status: Home / Self Care | Payer: Medicare Other | Source: Ambulatory Visit | Attending: Neurology | Admitting: Neurology

## 2020-07-20 ENCOUNTER — Other Ambulatory Visit: Payer: Self-pay

## 2020-07-20 DIAGNOSIS — R413 Other amnesia: Secondary | ICD-10-CM | POA: Diagnosis not present

## 2020-08-10 ENCOUNTER — Other Ambulatory Visit: Payer: Self-pay

## 2020-08-13 DIAGNOSIS — I349 Nonrheumatic mitral valve disorder, unspecified: Secondary | ICD-10-CM | POA: Diagnosis not present

## 2020-08-13 DIAGNOSIS — G309 Alzheimer's disease, unspecified: Secondary | ICD-10-CM | POA: Diagnosis not present

## 2020-08-13 DIAGNOSIS — R443 Hallucinations, unspecified: Secondary | ICD-10-CM | POA: Diagnosis not present

## 2020-08-13 DIAGNOSIS — K219 Gastro-esophageal reflux disease without esophagitis: Secondary | ICD-10-CM | POA: Diagnosis not present

## 2020-08-13 DIAGNOSIS — F0281 Dementia in other diseases classified elsewhere with behavioral disturbance: Secondary | ICD-10-CM | POA: Diagnosis not present

## 2020-08-13 DIAGNOSIS — F0151 Vascular dementia with behavioral disturbance: Secondary | ICD-10-CM | POA: Diagnosis not present

## 2020-08-13 DIAGNOSIS — Z9181 History of falling: Secondary | ICD-10-CM | POA: Diagnosis not present

## 2020-08-13 DIAGNOSIS — N189 Chronic kidney disease, unspecified: Secondary | ICD-10-CM | POA: Diagnosis not present

## 2020-08-13 DIAGNOSIS — M810 Age-related osteoporosis without current pathological fracture: Secondary | ICD-10-CM | POA: Diagnosis not present

## 2020-08-13 DIAGNOSIS — R2689 Other abnormalities of gait and mobility: Secondary | ICD-10-CM | POA: Diagnosis not present

## 2020-08-13 DIAGNOSIS — F32A Depression, unspecified: Secondary | ICD-10-CM | POA: Diagnosis not present

## 2020-08-13 DIAGNOSIS — I519 Heart disease, unspecified: Secondary | ICD-10-CM | POA: Diagnosis not present

## 2020-08-13 DIAGNOSIS — E538 Deficiency of other specified B group vitamins: Secondary | ICD-10-CM | POA: Diagnosis not present

## 2020-08-13 DIAGNOSIS — M1990 Unspecified osteoarthritis, unspecified site: Secondary | ICD-10-CM | POA: Diagnosis not present

## 2020-08-14 ENCOUNTER — Telehealth (INDEPENDENT_AMBULATORY_CARE_PROVIDER_SITE_OTHER): Payer: Medicare Other | Admitting: Family

## 2020-08-14 ENCOUNTER — Encounter: Payer: Self-pay | Admitting: Family

## 2020-08-14 VITALS — Ht 62.99 in | Wt 190.0 lb

## 2020-08-14 DIAGNOSIS — F329 Major depressive disorder, single episode, unspecified: Secondary | ICD-10-CM

## 2020-08-14 DIAGNOSIS — F0391 Unspecified dementia with behavioral disturbance: Secondary | ICD-10-CM | POA: Diagnosis not present

## 2020-08-14 DIAGNOSIS — G4733 Obstructive sleep apnea (adult) (pediatric): Secondary | ICD-10-CM

## 2020-08-14 DIAGNOSIS — M810 Age-related osteoporosis without current pathological fracture: Secondary | ICD-10-CM

## 2020-08-14 DIAGNOSIS — B351 Tinea unguium: Secondary | ICD-10-CM

## 2020-08-14 DIAGNOSIS — I700 Atherosclerosis of aorta: Secondary | ICD-10-CM

## 2020-08-14 NOTE — Progress Notes (Unsigned)
Virtual Visit via Video Note  I connected with@  on 08/15/20 at 11:00 AM EST by a video enabled telemedicine application and verified that I am speaking with the correct person using two identifiers.  Location patient: home Location provider:work  Persons participating in the virtual visit: patient, provider  I discussed the limitations of evaluation and management by telemedicine and the availability of in person appointments. The patient expressed understanding and agreed to proceed.   HPI: Accompanied by daughter, [name], whom lives with her. [name] is with her mother most of the time at home.   No significant changes in memory.   Patient participates in some cleaning of house. [name] has also hired person who helps with cleaning and getting patient dressed. Patient is no longer cooking or driving.  No recent falls. Doing PT for lower body strength.   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 Declined rivastigmine. Referred to PT.   Delusion and paranoia- compliant with abilify, lexapro, buspar- follows with [name], last visit 2 months ago without changes to regimen.   Osteoporosis- patient and daughter declines repeat dexa, medication. She remains off of calcium supplement.   CT head 06/2020 cerebral atrophy, progressed from 2014.   ROS: See pertinent positives and negatives per HPI.    EXAM:  VITALS per patient if applicable: Ht 5' 123XX123" (1.6 m)   Wt 190 lb (86.2 kg)   BMI 33.67 kg/m  BP Readings from Last 3 Encounters:  05/12/20 (!) 118/52  06/29/18 (!) 144/76  06/26/18 118/70   Wt Readings from Last 3 Encounters:  08/14/20 190 lb (86.2 kg)  06/28/20 190 lb (86.2 kg)  05/12/20 190 lb 12.8 oz (86.5 kg)    GENERAL: alert, oriented, appears well and in no acute distress  HEENT: atraumatic, conjunttiva clear, no obvious abnormalities on inspection of external nose  and ears  NECK: normal movements of the head and neck  LUNGS: on inspection no signs of respiratory distress, breathing rate appears normal, no obvious gross SOB, gasping or wheezing  CV: no obvious cyanosis  MS: moves all visible extremities without noticeable abnormality  PSYCH/NEURO: pleasant and cooperative, no obvious depression or anxiety, speech and thought processing grossly intact  ASSESSMENT AND PLAN:  Discussed the following assessment and plan:  Problem List Items Addressed This Visit      Respiratory   OSA (obstructive sleep apnea)    Compliant with cipap        Nervous and Auditory   Dementia with behavioral disturbance (HCC)    Stable. Reiterated the importance of continued follow up with dr Manuella Ghazi and discussion at follow up regarding medications which may slow progression of disease.         Musculoskeletal and Integument   Onychomycosis   Relevant Orders   Ambulatory referral to Podiatry   Osteoporosis    Declines any treatment nor return to follow with endocrine.         Other   Major depressive disorder with single episode    Stable. Will follow       Other Visit Diagnoses    Atherosclerosis of aorta ([place])    -  Primary   Relevant Orders   US Carotid Duplex Bilateral      -we discussed possible serious and likely etiologies, options for evaluation and workup, limitations of telemedicine visit vs in person visit, treatment, treatment risks and precautions. Pt  prefers to treat via telemedicine empirically rather then risking or undertaking an in person visit at this moment.  .   I discussed the assessment and treatment plan with the patient. The patient was provided an opportunity to ask questions and all were answered. The patient agreed with the plan and demonstrated an understanding of the instructions.   The patient was advised to call back or seek an in-person evaluation if the symptoms worsen or if the condition fails to improve as  anticipated.   Rennie Plowman, FNP

## 2020-08-14 NOTE — Patient Instructions (Addendum)
Referral for ultrasound Let us know if you dont hear back within a week in regards to an appointment being scheduled.   Let me know how you are doing.

## 2020-08-15 NOTE — Assessment & Plan Note (Signed)
Compliant with cipap.  

## 2020-08-15 NOTE — Assessment & Plan Note (Signed)
Stable. Will follow.  

## 2020-08-15 NOTE — Assessment & Plan Note (Signed)
Declines any treatment nor return to follow with endocrine.

## 2020-08-15 NOTE — Assessment & Plan Note (Signed)
Stable. Reiterated the importance of continued follow up with dr Sherryll Burger and discussion at follow up regarding medications which may slow progression of disease.

## 2020-08-17 ENCOUNTER — Ambulatory Visit: Payer: Medicare Other | Admitting: Podiatry

## 2020-08-31 DIAGNOSIS — M79675 Pain in left toe(s): Secondary | ICD-10-CM | POA: Diagnosis not present

## 2020-08-31 DIAGNOSIS — B351 Tinea unguium: Secondary | ICD-10-CM | POA: Diagnosis not present

## 2020-08-31 DIAGNOSIS — M79674 Pain in right toe(s): Secondary | ICD-10-CM | POA: Diagnosis not present

## 2020-09-14 ENCOUNTER — Ambulatory Visit
Admission: RE | Admit: 2020-09-14 | Discharge: 2020-09-14 | Disposition: A | Discharge status: Home / Self Care | Payer: Medicare Other | Source: Ambulatory Visit | Attending: Family | Admitting: Family

## 2020-09-14 ENCOUNTER — Other Ambulatory Visit: Payer: Self-pay | Admitting: Family

## 2020-09-14 ENCOUNTER — Other Ambulatory Visit: Payer: Self-pay

## 2020-09-14 DIAGNOSIS — I6522 Occlusion and stenosis of left carotid artery: Secondary | ICD-10-CM | POA: Diagnosis not present

## 2020-09-14 DIAGNOSIS — H539 Unspecified visual disturbance: Secondary | ICD-10-CM | POA: Diagnosis not present

## 2020-09-14 DIAGNOSIS — K219 Gastro-esophageal reflux disease without esophagitis: Secondary | ICD-10-CM

## 2020-09-14 DIAGNOSIS — I700 Atherosclerosis of aorta: Secondary | ICD-10-CM | POA: Diagnosis not present

## 2020-09-18 ENCOUNTER — Other Ambulatory Visit: Payer: Self-pay | Admitting: Psychiatry

## 2020-09-18 DIAGNOSIS — F3342 Major depressive disorder, recurrent, in full remission: Secondary | ICD-10-CM

## 2020-09-25 ENCOUNTER — Telehealth: Payer: Self-pay

## 2020-09-25 DIAGNOSIS — F3342 Major depressive disorder, recurrent, in full remission: Secondary | ICD-10-CM

## 2020-09-25 MED ORDER — ARIPIPRAZOLE 2 MG PO TABS: ORAL_TABLET | ORAL | 0 refills | Status: DC

## 2020-09-25 NOTE — Telephone Encounter (Signed)
Pt daughter called pt needs refills ARIPiprazole (ABILIFY) 2 MG tablet Medication Date: 06/26/2020 Department: [facility] Ordering/Authorizing: [place] Crane, MD    Order Providers  Prescribing Provider Encounter Provider  [place] Crane, MD [place] Crane, MD   Outpatient Medication Detail   Disp Refills Start End   ARIPiprazole (ABILIFY) 2 MG tablet 90 tablet 0 06/26/2020    Sig: TAKE 1 TABLET(2 MG) BY MOUTH DAILY   Sent to pharmacy as: ARIPiprazole (ABILIFY) 2 MG tablet   E-Prescribing Status: Receipt confirmed by pharmacy (06/26/2020 3:31 PM EST)    Associated Diagnoses  MDD (major depressive disorder), recurrent, in full remission [facility])      [place], [place] [facility] [place]

## 2020-09-25 NOTE — Telephone Encounter (Signed)
I have sent Abilify to pharmacy.

## 2020-10-26 ENCOUNTER — Other Ambulatory Visit: Payer: Self-pay

## 2020-10-26 ENCOUNTER — Encounter: Payer: Self-pay | Admitting: Psychiatry

## 2020-10-26 ENCOUNTER — Telehealth (INDEPENDENT_AMBULATORY_CARE_PROVIDER_SITE_OTHER): Payer: Medicare Other | Admitting: Psychiatry

## 2020-10-26 DIAGNOSIS — F3342 Major depressive disorder, recurrent, in full remission: Secondary | ICD-10-CM | POA: Diagnosis not present

## 2020-10-26 DIAGNOSIS — F0391 Unspecified dementia with behavioral disturbance: Secondary | ICD-10-CM

## 2020-10-26 MED ORDER — ARIPIPRAZOLE 2 MG PO TABS: 1.0000 mg | ORAL_TABLET | Freq: Every day | ORAL | 1 refills | Status: DC

## 2020-10-26 NOTE — Progress Notes (Signed)
Virtual Visit via Video Note  I connected with [name] on 10/26/20 at 11:30 AM EDT by a video enabled telemedicine application and verified that I am speaking with the correct person using two identifiers.  Location Provider Location : [facility] Patient Location : Home  Participants: Patient , Daughter ,Provider   I discussed the limitations of evaluation and management by telemedicine and the availability of in person appointments. The patient expressed understanding and agreed to proceed.  I discussed the assessment and treatment plan with the patient. The patient was provided an opportunity to ask questions and all were answered. The patient agreed with the plan and demonstrated an understanding of the instructions.   The patient was advised to call back or seek an in-person evaluation if the symptoms worsen or if the condition fails to improve as anticipated.  Video connection was lost at less than 50% of the duration of the visit, at which time the remainder of the visit was completed through audio only  [facility] MD OP Progress Note  10/26/2020 12:10 PM [name]  MRN:  [id number]  Chief Complaint:  Chief Complaint    Anxiety; Depression     HPI: [name] is a 87 year old Caucasian female who has a history of MDD, dementia, gastroesophageal reflux disease, obstructive sleep apnea, compliant on CPAP, osteoporosis, chronic kidney disease stage III was evaluated by telemedicine today.  Patient being a limited historian-daughter provided collateral information.  Patient today appeared to be alert and was oriented to person self and situation.  She was unable to give the date.  She however was able to give her date of birth and her address.  Patient answered questions in short phrases only.  She described her mood as not depressed.  Patient denied any suicidality, homicidality.  She did not appear to be preoccupied with any delusions.  According to daughter patient is currently  doing okay.  She continues to not do much and just sits .  She however has not had any crying spells.  She is eating okay.  She tends to crave sweets.  She continues to have chronic paranoia that people on TV watch her when she showers if the door to her bathroom is open.  If they close the bathroom she is fine.  She does not have any hallucinations.  Her hearing aid is working better now and she does not hearing any noises from it.  According to daughter she could hear the noises in the hearing aid when she tried it on in the past and she does not believe it was auditory hallucinations.  She had neurology appointment and her neurologist is planning to start her on a medication at the next visit for her memory changes.  According to neurologist her tremors likely are due to her being on medication like Abilify.  Daughter hence wonders whether she can be taken off of it.  Patient as well as daughter denies any other concerns today.  Visit Diagnosis:    ICD-10-CM   1. MDD (major depressive disorder), recurrent, in full remission ([place])  F33.42 ARIPiprazole (ABILIFY) 2 MG tablet  2. Dementia with behavioral disturbance, unspecified dementia type ([place])  F03.91     Past Psychiatric History: I have reviewed past psychiatric history from my progress note on 01/04/2020  Past Medical History:  Past Medical History:  Diagnosis Date  . Anxiety   . Depression   . GERD (gastroesophageal reflux disease)   . Hypercalcemia   . Pneumonia   .  Sepsis ([place])   . Sleep apnea   . SOB (shortness of breath) on exertion     Past Surgical History:  Procedure Laterality Date  . ENDOMETRIAL ABLATION    . EYE SURGERY  2011   left cataract  . KNEE ARTHROSCOPY Left 05/03/2015   Procedure: left knee arthroscopy, partial medial menisectomy, chondroplasty;  Surgeon: Dereck Leep, MD;  Location: [facility] ORS;  Service: Orthopedics;  Laterality: Left;  . TONSILLECTOMY      Family Psychiatric History: I have reviewed family  psychiatric history from my progress note on 01/04/2020  Family History:  Family History  Problem Relation Age of Onset  . Cancer Mother 55       ovarian   . Pernicious anemia Mother   . COPD Father        nonsmoker  . Cancer Cousin        ovarian ca    Social History: I have reviewed social history from my progress note on 01/04/2020 Social History   Socioeconomic History  . Marital status: Widowed    Spouse name: Not on file  . Number of children: 5  . Years of education: Not on file  . Highest education level: Not on file  Occupational History  . Occupation: retired  Tobacco Use  . Smoking status: Never Smoker  . Smokeless tobacco: Never Used  Vaping Use  . Vaping Use: Never used  Substance and Sexual Activity  . Alcohol use: No  . Drug use: No  . Sexual activity: Not Currently  Other Topics Concern  . Not on file  Social History Narrative   Regular exercise: not really   Caffeine use: some   Daughter lives with her.    Social Determinants of Health   Financial Resource Strain: Low Risk   . Difficulty of Paying Living Expenses: Not hard at all  Food Insecurity: No Food Insecurity  . Worried About Charity fundraiser in the Last Year: Never true  . Ran Out of Food in the Last Year: Never true  Transportation Needs: No Transportation Needs  . Lack of Transportation (Medical): No  . Lack of Transportation (Non-Medical): No  Physical Activity: Unknown  . Days of Exercise per Week: 0 days  . Minutes of Exercise per Session: Not on file  Stress: No Stress Concern Present  . Feeling of Stress : Not at all  Social Connections: Unknown  . Frequency of Communication with Friends and Family: Not on file  . Frequency of Social Gatherings with Friends and Family: More than three times a week  . Attends Religious Services: Not on file  . Active Member of Clubs or Organizations: Not on file  . Attends Archivist Meetings: Not on file  . Marital Status:  Widowed    Allergies:  Allergies  Allergen Reactions  . Codeine Other (See Comments)    Crying spells  . Darvon [Propoxyphene Hcl] Other (See Comments)    Crying spells  . Propoxyphene Other (See Comments)    Crying, altered mental status    Metabolic Disorder Labs: Lab Results  Component Value Date   HGBA1C 6.0 08/21/2017   Lab Results  Component Value Date   PROLACTIN 11.9 05/12/2020   Lab Results  Component Value Date   CHOL 204 (H) 05/12/2020   TRIG 284 (H) 05/12/2020   HDL 40 (L) 05/12/2020   CHOLHDL 5.1 (H) 05/12/2020   VLDL 58.4 (H) 06/26/2015   LDLCALC 120 (H) 05/12/2020   [place]  117 (H) 05/07/2013   Lab Results  Component Value Date   TSH 3.68 05/12/2020   TSH 3.73 08/21/2017    Therapeutic Level Labs: No results found for: LITHIUM No results found for: VALPROATE No components found for:  CBMZ  Current Medications: Current Outpatient Medications  Medication Sig Dispense Refill  . ARIPiprazole (ABILIFY) 2 MG tablet Take 0.5 tablets (1 mg total) by mouth daily. Take half tablet daily for 30 days and then take half tablet every other day for 2 weeks and stop 30 tablet 1  . busPIRone (BUSPAR) 5 MG tablet Take 1 tablet (5 mg total) by mouth 2 (two) times daily. 180 tablet 3  . Cholecalciferol (VITAMIN D-3 PO) Take 1 capsule by mouth daily. 500iu    . escitalopram (LEXAPRO) 10 MG tablet Take 1 tablet (10 mg total) by mouth daily. 90 tablet 3  . gentamicin ointment (GARAMYCIN) 0.1 % Apply topically 3 (three) times daily.    Marland Kitchen ipratropium (ATROVENT) 0.03 % nasal spray SMARTSIG:1-2 Spray(s) Both Nares 3 Times Daily PRN    . loperamide (IMODIUM) 2 MG capsule Take 2 mg by mouth 2 (two) times daily.    Marland Kitchen triamcinolone cream (KENALOG) 0.1 % Apply topically 2 (two) times daily. 454 g 1  . vitamin B-12 (CYANOCOBALAMIN) 1000 MCG tablet Take by mouth.     No current facility-administered medications for this visit.     Musculoskeletal: Strength & Muscle Tone:  UTA Gait & Station: UTA Patient leans: N/A  Psychiatric Specialty Exam: Review of Systems  Unable to perform ROS: Dementia    There were no vitals taken for this visit.There is no height or weight on file to calculate BMI.  General Appearance: UTA  Eye Contact:  UTA  Speech:  Clear and Coherent  Volume:  Normal  Mood:  Euthymic  Affect:  UTA  Thought Process:  Goal Directed and Descriptions of Associations: Intact  Orientation:  Other:  Self, situation  Thought Content: Delusions Chronic  Suicidal Thoughts:  No  Homicidal Thoughts:  No  Memory:  Immediate;   Fair Recent;   limited Remote;   limited  Judgement:  Fair  Insight:  Shallow  Psychomotor Activity:  UTA  Concentration:  Concentration: Fair and Attention Span: Fair  Recall:  Poor  Fund of Knowledge: Fair  Language: Fair  Akathisia:  No  Handed:  Right  AIMS (if indicated): UTA  Assets:  Housing Social Support  ADL's:  Intact  Cognition: Impaired,  Mild  Sleep:  Fair   Screenings: [place] Office Visit from 06/16/2017 in [facility] Office Visit from 02/07/2016 in [facility] Total Score 0 0    Mini-Mental   [place] Office Visit from 10/09/2015 in [facility]  Total Score (max 30 points ) 27    PHQ2-9   Flowsheet Row Video Visit from 10/26/2020 in [facility] from 06/28/2020 in [facility] Office Visit from 05/12/2020 in [facility] Office Visit from 09/18/2017 in [facility] Office Visit from 05/30/2016 in [facility]  PHQ-2 Total Score 1 0 0 0 0    Flowsheet Row Video Visit from 10/26/2020 in [facility] No Risk       Assessment and Plan: [name] is a 87 year old Caucasian female, lives in [place], has a history of MDD,  dementia, anxiety disorder was  evaluated by telemedicine today.  Patient is biologically predisposed given her history of medical problems.  Patient with psychosocial stressors of the current pandemic, her own health issues.  Patient is currently making progress.  Discussed plan as noted below.  Plan MDD in full remission Lexapro 10 mg p.o. daily BuSpar 5 mg p.o. twice daily Taper off Abilify.  Advised to start taking Abilify 1 mg p.o. daily, half tablet for 30 days.  If she continues to do well after 30 days she could start taking it every other day for 15 days and stop taking it. Daughter to monitor  patient's symptoms closely.  If she has any worsening mood symptoms or psychosis she could restart the Abilify 2 mg and let writer know.  Dementia with behavioral disturbances-patient will continue to follow-up with neurology. I have reviewed notes per neurology dated 07/03/2020-Dr. Shah-patient with moderate mixed dementia-Alzheimer's and vascular dementia with psychologic features versus component of Lewy body dementia.  Delusions and paranoia related to dementia.  Resting hand tremors right-sided-drug-induced versus parkinsonian son.  For balance problems will order physical therapy.  Vitamin B12 deficiency-taking oral vitamin B12 supplementation.  obstructive sleep apnea using OSA.'  Collateral information obtained from daughter as summarized above.  Follow-up in clinic in person in 2 months or sooner if needed.  I have spent atleast 25 minutes non face to face with patient today which includes the time spent for preparing to see the patient ( e.g., review of test, records ), obtaining and to review and separately obtained history , ordering medications and test ,psychoeducation and supportive psychotherapy and care coordination,as well as documenting clinical information in electronic health record. This note was generated in part or whole with voice recognition software. Voice recognition is  usually quite accurate but there are transcription errors that can and very often do occur. I apologize for any typographical errors that were not detected and corrected.      Allene Dillon, MD 10/27/2020, 1:03 PM

## 2020-10-26 NOTE — Patient Instructions (Signed)
https://point-[email or web address]-care.elsevierperformancemanager.com/skills">  Dementia Caregiver Guide Dementia is a term used to describe a number of symptoms that affect memory and thinking. The most common symptoms include:  Memory loss.  Trouble with language and communication.  Trouble concentrating.  Poor judgment and problems with reasoning.  Wandering from home or public places.  Extreme anxiety or depression.  Being suspicious or having angry outbursts and accusations.  Child-like behavior and language. Dementia can be frightening and confusing. And taking care of someone with dementia can be challenging. This guide provides tips to help you when providing care for a person with dementia. How to help manage lifestyle changes Dementia usually gets worse slowly over time. In the early stages, people with dementia can stay independent and safe with some help. In later stages, they need help with daily tasks such as dressing, grooming, and using the bathroom. There are actions you can take to help a person manage his or her life while living with this condition. Communicating  When the person is talking or seems frustrated, make eye contact and hold the person's hand.  Ask specific questions that need yes or no answers.  Use simple words, short sentences, and a calm voice. Only give one direction at a time.  When offering choices, limit the person to just one or two.  Avoid correcting the person in a negative way.  If the person is struggling to find the right words, gently try to help him or her. Preventing injury  Keep floors clear of clutter. Remove rugs, magazine racks, and floor lamps.  Keep hallways well lit, especially at night.  Put a handrail and nonslip mat in the bathtub or shower.  Put childproof locks on cabinets that contain dangerous items, such as medicines, alcohol, guns, toxic cleaning items, sharp tools or utensils, matches, and lighters.  For doors to the  outside of the house, put the locks in places where the person cannot see or reach them easily. This will help ensure that the person does not wander out of the house and get lost.  Be prepared for emergencies. Keep a list of emergency phone numbers and addresses in a convenient area.  Remove car keys and lock garage doors so that the person does not try to get in the car and drive.  Have the person wear a bracelet that tracks locations and identifies the person as having memory problems. This should be worn at all times for safety.   Helping with daily life  Keep the person on track with his or her routine.  Try to identify areas where the person may need help.  Be supportive, patient, calm, and encouraging.  Gently remind the person that adjusting to changes takes time.  Help with the tasks that the person has asked for help with.  Keep the person involved in daily tasks and decisions as much as possible.  Encourage conversation, but try not to get frustrated if the person struggles to find words or does not seem to appreciate your help.   How to recognize stress Look for signs of stress in yourself and in the person you are caring for. If you notice signs of stress, take steps to manage it. Symptoms of stress include:  Feeling anxious, irritable, frustrated, or angry.  Denying that the person has dementia or that his or her symptoms will not improve.  Feeling depressed, hopeless, or unappreciated.  Difficulty sleeping.  Difficulty concentrating.  Developing stress-related health problems.  Feeling like you have too little time  for your own life. Follow these instructions at home: Take care of your health Make sure that you and the person you are caring for:  Get regular sleep.  Exercise regularly.  Eat regular, nutritious meals.  Take over-the-counter and prescription medicines only as told by your health care providers.  Drink enough fluid to keep your urine pale  yellow.  Attend all scheduled health care appointments.   General instructions  Join a support group with others who are caregivers.  Ask about respite care resources. Respite care can provide short-term care for the person so that you can have a regular break from the stress of caregiving.  Consider any safety risks and take steps to avoid them.  Organize medicines in a pill box for each day of the week.  Create a plan to handle any legal or financial matters. Get legal or financial advice if needed.  Keep a calendar in a central location to remind the person of appointments or other activities. Where to find support: Many individuals and organizations offer support. These include:  Support groups for people with dementia.  Support groups for caregivers.  Counselors or therapists.  Home health care services.  Adult day care centers. Where to find more information  Centers for Disease Control and Prevention: http://www.wolf.info/  Alzheimer's Association: [place].co.nz  Family Caregiver Alliance: www.caregiver.[place]: www.alzfdn.org Contact a health care provider if:  The person's health is rapidly getting worse.  You are no longer able to care for the person.  Caring for the person is affecting your physical and emotional health.  You are feeling depressed or anxious about caring for the person. Get help right away if:  The person threatens himself or herself, you, or anyone else.  You feel depressed or sad, or feel that you want to harm yourself. If you ever feel like your loved one may hurt himself or herself or others, or if he or she shares thoughts about taking his or her own life, get help right away. You can go to your nearest emergency department or:  Call your local emergency services (911 in the [place]  Call a suicide crisis helpline, such as the [place] at 914-680-2487. This is open 24 hours a day in  the [place]  Text the Crisis Text Line at 773-264-0673 (in the [place].). Summary  Dementia is a term used to describe a number of symptoms that affect memory and thinking.  Dementia usually gets worse slowly over time.  Take steps to reduce the person's risk of injury and to plan for future care.  Caregivers need support, relief from caregiving, and time for their own lives. This information is not intended to replace advice given to you by your health care provider. Make sure you discuss any questions you have with your health care provider. Document Revised: 12/13/2019 Document Reviewed: 12/13/2019 Elsevier Patient Education  [place].

## 2020-11-06 DIAGNOSIS — R413 Other amnesia: Secondary | ICD-10-CM | POA: Diagnosis not present

## 2020-11-06 DIAGNOSIS — F0280 Dementia in other diseases classified elsewhere without behavioral disturbance: Secondary | ICD-10-CM | POA: Diagnosis not present

## 2020-11-06 DIAGNOSIS — G3183 Dementia with Lewy bodies: Secondary | ICD-10-CM | POA: Diagnosis not present

## 2020-11-13 ENCOUNTER — Ambulatory Visit (INDEPENDENT_AMBULATORY_CARE_PROVIDER_SITE_OTHER): Payer: Medicare Other | Admitting: Family

## 2020-11-13 ENCOUNTER — Encounter: Payer: Self-pay | Admitting: Family

## 2020-11-13 ENCOUNTER — Other Ambulatory Visit: Payer: Self-pay

## 2020-11-13 DIAGNOSIS — G3183 Dementia with Lewy bodies: Secondary | ICD-10-CM

## 2020-11-13 DIAGNOSIS — G4733 Obstructive sleep apnea (adult) (pediatric): Secondary | ICD-10-CM | POA: Diagnosis not present

## 2020-11-13 DIAGNOSIS — F32A Depression, unspecified: Secondary | ICD-10-CM | POA: Diagnosis not present

## 2020-11-13 DIAGNOSIS — E210 Primary hyperparathyroidism: Secondary | ICD-10-CM | POA: Diagnosis not present

## 2020-11-13 DIAGNOSIS — F0281 Dementia in other diseases classified elsewhere with behavioral disturbance: Secondary | ICD-10-CM

## 2020-11-13 DIAGNOSIS — F02818 Dementia in other diseases classified elsewhere, unspecified severity, with other behavioral disturbance: Secondary | ICD-10-CM

## 2020-11-13 NOTE — Patient Instructions (Signed)
Nice to see you!   

## 2020-11-13 NOTE — Assessment & Plan Note (Signed)
Compliant with cipap.  

## 2020-11-13 NOTE — Assessment & Plan Note (Signed)
She is no longer following with Dr Gabriel Carina. Advised to continue to abstain from calcium supplements.

## 2020-11-13 NOTE — Assessment & Plan Note (Signed)
Stable. Following with [name], continue abilify, lexapro, buspirone

## 2020-11-13 NOTE — Assessment & Plan Note (Signed)
Appears stable today. She will continue to follow with [name], will follow.

## 2020-11-13 NOTE — Progress Notes (Signed)
Subjective:    Patient ID: [name], female    DOB: 09-Jun-1932, 87 y.o.   MRN: [id number]  CC: [name] is a 87 y.o. female who presents today for follow up.   HPI: Accompanied by caregiver , [name], whom patient lives with.  Caregiver is primary historian. Patient is also hard of hearing, wears hearing aids.  She is eating well, eating more sweets. Caregiver hasnt noticed weight loss at home and reports her to have good appetite.  No recent falls.  Lewy body dementia- following with neurology, [name] whom started rivastigmine 1.5mg  three days ago. Follow up scheduled in 6 months.   Cholesterol has been elevated in the past ( 05/2020); It is difficult for patient to fast per caregiver.   Hypercalcemia- no longer following with endocrine and patient has declined parathyroid surgery   Delusion and paranoia- follows with [name] , she compliant with abilify, lexapro, buspirone  OSA- using cipap      HISTORY:  Past Medical History:  Diagnosis Date  . Anxiety   . Depression   . GERD (gastroesophageal reflux disease)   . Hypercalcemia   . Pneumonia   . Sepsis ([place])   . Sleep apnea   . SOB (shortness of breath) on exertion    Past Surgical History:  Procedure Laterality Date  . ENDOMETRIAL ABLATION    . EYE SURGERY  2011   left cataract  . KNEE ARTHROSCOPY Left 05/03/2015   Procedure: left knee arthroscopy, partial medial menisectomy, chondroplasty;  Surgeon: Dereck Leep, MD;  Location: [facility] ORS;  Service: Orthopedics;  Laterality: Left;  . TONSILLECTOMY     Family History  Problem Relation Age of Onset  . Cancer Mother 77       ovarian   . Pernicious anemia Mother   . COPD Father        nonsmoker  . Cancer Cousin        ovarian ca    Allergies: Codeine, Darvon [propoxyphene hcl], and Propoxyphene Current Outpatient Medications on File Prior to Visit  Medication Sig Dispense Refill  . ARIPiprazole (ABILIFY) 2 MG tablet Take 0.5 tablets (1 mg  total) by mouth daily. Take half tablet daily for 30 days and then take half tablet every other day for 2 weeks and stop 30 tablet 1  . busPIRone (BUSPAR) 5 MG tablet Take 1 tablet (5 mg total) by mouth 2 (two) times daily. 180 tablet 3  . Cholecalciferol (VITAMIN D-3 PO) Take 1 capsule by mouth daily. 500iu    . escitalopram (LEXAPRO) 10 MG tablet Take 1 tablet (10 mg total) by mouth daily. 90 tablet 3  . ipratropium (ATROVENT) 0.03 % nasal spray SMARTSIG:1-2 Spray(s) Both Nares 3 Times Daily PRN    . loperamide (IMODIUM) 2 MG capsule Take 2 mg by mouth 2 (two) times daily.    Marland Kitchen triamcinolone cream (KENALOG) 0.1 % Apply topically 2 (two) times daily. 454 g 1  . vitamin B-12 (CYANOCOBALAMIN) 1000 MCG tablet Take by mouth.    Marland Kitchen gentamicin ointment (GARAMYCIN) 0.1 % Apply topically 3 (three) times daily. (Patient not taking: Reported on 11/13/2020)    . rivastigmine (EXELON) 1.5 MG capsule Take 1.5 mg by mouth 2 (two) times daily.     No current facility-administered medications on file prior to visit.    Social History   Tobacco Use  . Smoking status: Never Smoker  . Smokeless tobacco: Never Used  Vaping Use  . Vaping Use: Never  used  Substance Use Topics  . Alcohol use: No  . Drug use: No    Review of Systems  Constitutional: Negative for chills and fever.  Respiratory: Negative for cough and shortness of breath.   Cardiovascular: Negative for chest pain, palpitations and leg swelling.  Gastrointestinal: Negative for nausea and vomiting.      Objective:    BP (!) 112/58 (BP Location: Left Arm, Patient Position: Sitting, Cuff Size: Normal)   Pulse 75   Temp 98.4 F (36.9 C) (Oral)   Resp 16   Ht 5\' 2"  (1.575 m)   Wt 182 lb 11 oz (82.9 kg)   SpO2 98%   BMI 33.41 kg/m  BP Readings from Last 3 Encounters:  11/13/20 (!) 112/58  05/12/20 (!) 118/52  06/26/18 118/70   Wt Readings from Last 3 Encounters:  11/13/20 182 lb 11 oz (82.9 kg)  08/14/20 190 lb (86.2 kg)  06/28/20  190 lb (86.2 kg)    Physical Exam Vitals reviewed.  Constitutional:      Appearance: She is well-developed.  Eyes:     Conjunctiva/sclera: Conjunctivae normal.  Cardiovascular:     Rate and Rhythm: Normal rate and regular rhythm.     Pulses: Normal pulses.     Heart sounds: Normal heart sounds.  Pulmonary:     Effort: Pulmonary effort is normal.     Breath sounds: Normal breath sounds. No wheezing, rhonchi or rales.  Musculoskeletal:     Right lower leg: No edema.     Left lower leg: No edema.  Skin:    General: Skin is warm and dry.  Neurological:     Mental Status: She is alert.     Comments: Appropriately dressed, groomed.   Psychiatric:        Speech: Speech normal.        Behavior: Behavior normal.        Thought Content: Thought content normal.        Assessment & Plan:   Problem List Items Addressed This Visit      Respiratory   OSA (obstructive sleep apnea)    Compliant with cipap.         Endocrine   Primary hyperparathyroidism [facility])    She is no longer following with [name]. Advised to continue to abstain from calcium supplements.        Nervous and Auditory   Lewy body dementia ([name])    Appears stable today. She will continue to follow with [name], will follow.       Relevant Medications   rivastigmine (EXELON) 1.5 MG capsule     Other   Depression    Stable. Following with [name], continue abilify, lexapro, buspirone          I am having [name] maintain her Cholecalciferol (VITAMIN D-3 PO), loperamide, triamcinolone, vitamin B-12, ipratropium, gentamicin ointment, busPIRone, escitalopram, ARIPiprazole, and rivastigmine.   No orders of the defined types were placed in this encounter.   Return precautions given.   Risks, benefits, and alternatives of the medications and treatment plan prescribed today were discussed, and patient expressed understanding.   Education regarding symptom management and diagnosis given  to patient on AVS.  Continue to follow with [name] Hawthorne, FNP for routine health maintenance.   [name] and I agreed with plan.   [name] Paris, FNP

## 2020-12-20 ENCOUNTER — Other Ambulatory Visit: Payer: Self-pay | Admitting: Psychiatry

## 2020-12-20 DIAGNOSIS — F3342 Major depressive disorder, recurrent, in full remission: Secondary | ICD-10-CM

## 2020-12-26 ENCOUNTER — Telehealth: Payer: Medicare Other | Admitting: Psychiatry

## 2020-12-28 ENCOUNTER — Other Ambulatory Visit: Payer: Self-pay

## 2020-12-28 ENCOUNTER — Encounter: Payer: Self-pay | Admitting: Psychiatry

## 2020-12-28 ENCOUNTER — Telehealth (INDEPENDENT_AMBULATORY_CARE_PROVIDER_SITE_OTHER): Payer: Medicare Other | Admitting: Psychiatry

## 2020-12-28 DIAGNOSIS — G3183 Dementia with Lewy bodies: Secondary | ICD-10-CM

## 2020-12-28 DIAGNOSIS — F02818 Dementia in other diseases classified elsewhere, unspecified severity, with other behavioral disturbance: Secondary | ICD-10-CM

## 2020-12-28 DIAGNOSIS — F0281 Dementia in other diseases classified elsewhere with behavioral disturbance: Secondary | ICD-10-CM

## 2020-12-28 DIAGNOSIS — F3342 Major depressive disorder, recurrent, in full remission: Secondary | ICD-10-CM

## 2020-12-28 NOTE — Progress Notes (Signed)
Virtual Visit via Video Note  I connected with [name] on 12/28/20 at  3:00 PM EDT by a video enabled telemedicine application and verified that I am speaking with the correct person using two identifiers.  Location Provider Location : [facility] Patient Location : Home  Participants: Patient ,Daughter, Provider    I discussed the limitations of evaluation and management by telemedicine and the availability of in person appointments. The patient expressed understanding and agreed to proceed.     I discussed the assessment and treatment plan with the patient. The patient was provided an opportunity to ask questions and all were answered. The patient agreed with the plan and demonstrated an understanding of the instructions.   The patient was advised to call back or seek an in-person evaluation if the symptoms worsen or if the condition fails to improve as anticipated.  Marlette MD OP Progress Note  12/28/2020 8:19 PM [name]  MRN:  [id number]  Chief Complaint:  Chief Complaint    Follow-up; Depression; Anxiety     HPI: [name] is a 87 year old Caucasian female who has a history of MDD, dementia, gastroesophageal reflux disease, obstructive sleep apnea compliant on CPAP, osteoporosis, chronic kidney disease stage III was evaluated by telemedicine today.  Collateral information was provided by daughter [name] .  Patient today appeared to be alert, oriented to person place, situation.  Patient was able to give her correct address and date of birth.  Patient answered questions appropriately with help from her daughter.  Patient denies any sadness, denies any anxiety symptoms.  She reports sleep is overall okay and feels rested when she wakes up in the morning.  She does take a short nap during the day.  According to daughter patient has been following up with neurologist.  They started her on rivastigmine which she did not tolerate well which was later on changed to  donpezil.  She is currently tolerating it okay.  She does have memory problems and asked questions over and over again often.  According to neurologist it is likely she does not have Alzheimer's, rather Lewy body dementia.  Patient does have a swallowing problem which has been ongoing for a long time and that does have an impact on her ability to eat.  She however has been eating sufficiently.  She does follow up with her provider for management of the same.  Patient did not express any suicidality.  She does continue to have paranoia especially when she is in the shower and worries about people watching her or coming in.  However it does not appear to be overwhelming or consuming her.  Patient is tolerating her medications well.  Denies side effects.  Patient denies any other concerns today.  Visit Diagnosis:    ICD-10-CM   1. MDD (major depressive disorder), recurrent, in full remission ([place])  F33.42   2. Lewy body dementia with behavioral disturbance (HCC)  G31.83    F02.81     Past Psychiatric History: I have reviewed past psychiatric history from progress note on 01/04/2020  Past Medical History:  Past Medical History:  Diagnosis Date  . Anxiety   . Depression   . GERD (gastroesophageal reflux disease)   . Hypercalcemia   . Pneumonia   . Sepsis ([place])   . Sleep apnea   . SOB (shortness of breath) on exertion     Past Surgical History:  Procedure Laterality Date  . ENDOMETRIAL ABLATION    . EYE SURGERY  2011   left cataract  . KNEE ARTHROSCOPY Left 05/03/2015   Procedure: left knee arthroscopy, partial medial menisectomy, chondroplasty;  Surgeon: Dereck Leep, MD;  Location: [facility] ORS;  Service: Orthopedics;  Laterality: Left;  . TONSILLECTOMY      Family Psychiatric History: I have reviewed family psychiatric history from progress note on 01/04/2020  Family History:  Family History  Problem Relation Age of Onset  . Cancer Mother 74       ovarian   . Pernicious anemia  Mother   . COPD Father        nonsmoker  . Cancer Cousin        ovarian ca    Social History: I have reviewed social history from progress note on 01/04/2020 Social History   Socioeconomic History  . Marital status: Widowed    Spouse name: Not on file  . Number of children: 5  . Years of education: Not on file  . Highest education level: Not on file  Occupational History  . Occupation: retired  Tobacco Use  . Smoking status: Never Smoker  . Smokeless tobacco: Never Used  Vaping Use  . Vaping Use: Never used  Substance and Sexual Activity  . Alcohol use: No  . Drug use: No  . Sexual activity: Not Currently  Other Topics Concern  . Not on file  Social History Narrative   Regular exercise: not really   Caffeine use: some   Daughter lives with her.    Social Determinants of Health   Financial Resource Strain: Low Risk   . Difficulty of Paying Living Expenses: Not hard at all  Food Insecurity: No Food Insecurity  . Worried About Charity fundraiser in the Last Year: Never true  . Ran Out of Food in the Last Year: Never true  Transportation Needs: No Transportation Needs  . Lack of Transportation (Medical): No  . Lack of Transportation (Non-Medical): No  Physical Activity: Unknown  . Days of Exercise per Week: 0 days  . Minutes of Exercise per Session: Not on file  Stress: No Stress Concern Present  . Feeling of Stress : Not at all  Social Connections: Unknown  . Frequency of Communication with Friends and Family: Not on file  . Frequency of Social Gatherings with Friends and Family: More than three times a week  . Attends Religious Services: Not on file  . Active Member of Clubs or Organizations: Not on file  . Attends Archivist Meetings: Not on file  . Marital Status: Widowed    Allergies:  Allergies  Allergen Reactions  . Codeine Other (See Comments)    Crying spells  . Darvon [Propoxyphene Hcl] Other (See Comments)    Crying spells  .  Propoxyphene Other (See Comments)    Crying, altered mental status    Metabolic Disorder Labs: Lab Results  Component Value Date   HGBA1C 6.0 08/21/2017   Lab Results  Component Value Date   PROLACTIN 11.9 05/12/2020   Lab Results  Component Value Date   CHOL 204 (H) 05/12/2020   TRIG 284 (H) 05/12/2020   HDL 40 (L) 05/12/2020   CHOLHDL 5.1 (H) 05/12/2020   VLDL 58.4 (H) 06/26/2015   LDLCALC 120 (H) 05/12/2020   LDLCALC 117 (H) 05/07/2013   Lab Results  Component Value Date   TSH 3.68 05/12/2020   TSH 3.73 08/21/2017    Therapeutic Level Labs: No results found for: LITHIUM No results found for: VALPROATE No components found  for:  CBMZ  Current Medications: Current Outpatient Medications  Medication Sig Dispense Refill  . donepezil (ARICEPT) 5 MG tablet TAKE 1 TABLET(5 MG) BY MOUTH EVERY NIGHT    . busPIRone (BUSPAR) 5 MG tablet Take 1 tablet (5 mg total) by mouth 2 (two) times daily. 180 tablet 3  . Cholecalciferol (VITAMIN D-3 PO) Take 1 capsule by mouth daily. 500iu    . escitalopram (LEXAPRO) 10 MG tablet Take 1 tablet (10 mg total) by mouth daily. 90 tablet 3  . gentamicin ointment (GARAMYCIN) 0.1 % Apply topically 3 (three) times daily. (Patient not taking: Reported on 11/13/2020)    . ipratropium (ATROVENT) 0.03 % nasal spray SMARTSIG:1-2 Spray(s) Both Nares 3 Times Daily PRN    . loperamide (IMODIUM) 2 MG capsule Take 2 mg by mouth 2 (two) times daily.    . rivastigmine (EXELON) 1.5 MG capsule Take 1.5 mg by mouth 2 (two) times daily.    Marland Kitchen triamcinolone cream (KENALOG) 0.1 % Apply topically 2 (two) times daily. 454 g 1  . vitamin B-12 (CYANOCOBALAMIN) 1000 MCG tablet Take by mouth.     No current facility-administered medications for this visit.     Musculoskeletal: Strength & Muscle Tone: UTA Gait & Station: UTA Patient leans: N/A  Psychiatric Specialty Exam: Review of Systems  Unable to perform ROS: Dementia    There were no vitals taken for this  visit.There is no height or weight on file to calculate BMI.  General Appearance: Fairly Groomed  Eye Contact:  Minimal  Speech:  Slow  Volume:  Decreased  Mood:  Euthymic  Affect:  Full Range  Thought Process:  Goal Directed and Descriptions of Associations: Intact  Orientation:  Other:  Place, self, situation  Thought Content: Logical   Suicidal Thoughts:  No  Homicidal Thoughts:  No  Memory:  Immediate;   Fair Recent;   Limited Remote;   Limited  Judgement:  Fair  Insight:  Fair  Psychomotor Activity:  UTA  Concentration:  Concentration: Fair and Attention Span: Fair  Recall:  [employer] of Knowledge: Fair  Language: Fair  Akathisia:  No  Handed:  Right  AIMS (if indicated): UTA  Assets:  Housing Social Support  ADL's:  Intact  Cognition: Impaired, unspecified  Sleep:  Fair   Screenings: [place] Office Visit from 06/16/2017 in [place] Office Visit from 02/07/2016 in [place] Total Score 0 0    Mini-Mental   [place] Office Visit from 10/09/2015 in [place]  Total Score (max 30 points ) 27    PHQ2-9   Flowsheet Row Video Visit from 10/26/2020 in [place] from 06/28/2020 in [facility] Office Visit from 05/12/2020 in [place] Office Visit from 09/18/2017 in [place] Office Visit from 05/30/2016 in [place]  PHQ-2 Total Score 1 0 0 0 0    Flowsheet Row Video Visit from 10/26/2020 in [place] No Risk       Assessment and Plan: ARMA REINING is a 87 year old Caucasian female, lives in [place], has a history of MDD, dementia, anxiety disorder was evaluated by telemedicine today.  Patient is currently stable on current medication regimen.  Plan as noted  below.  Plan MDD in full remission Lexapro 10 mg p.o. daily BuSpar 5 mg p.o. daily Discontinue Abilify-tapered off.  Patient to continue to follow-up with neurology for dementia.  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 twice daily. CT head with contrast-07/21/2020- pronto predominant cerebral atrophy progress from 2014.  No evidence of acute intracranial abnormality or reversible causes of memory loss.  Collateral information obtained from daughter as noted above.  Follow-up in clinic in 3 months in office.  This note was generated in part or whole with voice recognition software. Voice recognition is usually quite accurate but there are transcription errors that can and very often do occur. I apologize for any typographical errors that were not detected and corrected.       Ursula Alert, MD 12/28/2020, 8:19 PM

## 2021-02-19 ENCOUNTER — Other Ambulatory Visit: Payer: Self-pay

## 2021-02-19 ENCOUNTER — Ambulatory Visit (INDEPENDENT_AMBULATORY_CARE_PROVIDER_SITE_OTHER): Payer: Medicare Other | Admitting: Family

## 2021-02-19 ENCOUNTER — Encounter: Payer: Self-pay | Admitting: Family

## 2021-02-19 VITALS — BP 104/68 | HR 62 | Temp 98.2°F | Ht 62.0 in | Wt 175.2 lb

## 2021-02-19 DIAGNOSIS — G3183 Dementia with Lewy bodies: Secondary | ICD-10-CM

## 2021-02-19 DIAGNOSIS — K219 Gastro-esophageal reflux disease without esophagitis: Secondary | ICD-10-CM

## 2021-02-19 DIAGNOSIS — E785 Hyperlipidemia, unspecified: Secondary | ICD-10-CM | POA: Diagnosis not present

## 2021-02-19 DIAGNOSIS — R21 Rash and other nonspecific skin eruption: Secondary | ICD-10-CM

## 2021-02-19 DIAGNOSIS — F02818 Dementia in other diseases classified elsewhere, unspecified severity, with other behavioral disturbance: Secondary | ICD-10-CM

## 2021-02-19 DIAGNOSIS — F0281 Dementia in other diseases classified elsewhere with behavioral disturbance: Secondary | ICD-10-CM

## 2021-02-19 LAB — COMPREHENSIVE METABOLIC PANEL
ALT: 10 U/L (ref 0–35)
AST: 13 U/L (ref 0–37)
Albumin: 4.1 g/dL (ref 3.5–5.2)
Alkaline Phosphatase: 62 U/L (ref 39–117)
BUN: 15 mg/dL (ref 6–23)
CO2: 29 mEq/L (ref 19–32)
Calcium: 10.2 mg/dL (ref 8.4–10.5)
Chloride: 105 mEq/L (ref 96–112)
Creatinine, Ser: 1.09 mg/dL (ref 0.40–1.20)
GFR: 45.86 mL/min — ABNORMAL LOW (ref >60.00)
Glucose, Bld: 83 mg/dL (ref 70–99)
Potassium: 4.3 mEq/L (ref 3.5–5.1)
Sodium: 141 mEq/L (ref 135–145)
Total Bilirubin: 0.7 mg/dL (ref 0.2–1.2)
Total Protein: 6.8 g/dL (ref 6.0–8.3)

## 2021-02-19 LAB — LIPID PANEL
Cholesterol: 166 mg/dL (ref 0–200)
HDL: 44.4 mg/dL (ref >39.00)
LDL Cholesterol: 93 mg/dL (ref 0–99)
NonHDL: 121.17
Total CHOL/HDL Ratio: 4
Triglycerides: 139 mg/dL (ref 0.0–149.0)
VLDL: 27.8 mg/dL (ref 0.0–40.0)

## 2021-02-19 MED ORDER — FAMOTIDINE 20 MG PO TABS: 20.0000 mg | ORAL_TABLET | Freq: Every evening | ORAL | 0 refills | Status: DC

## 2021-02-19 MED ORDER — CETIRIZINE HCL 5 MG PO TABS: 5.0000 mg | ORAL_TABLET | Freq: Every day | ORAL | 1 refills | Status: DC

## 2021-02-19 MED ORDER — CLOTRIMAZOLE-BETAMETHASONE 1-0.05 % EX CREA: 1.0000 "application " | TOPICAL_CREAM | Freq: Every day | CUTANEOUS | 3 refills | Status: DC

## 2021-02-19 NOTE — Assessment & Plan Note (Signed)
Chronic stable. On no medications at this time.She will continue to follow with neurology

## 2021-02-19 NOTE — Progress Notes (Signed)
Subjective:    Patient ID: [name], female    DOB: February 11, 1932, 87 y.o.   MRN: [id number]  CC: [name] is a 87 y.o. female who presents today for follow up.   HPI: Accompanied by daughter and caregiver [name]  Complains of itchy, red rash over bilateral breasts  and bilateral groin x 4 weeks, comes and goes. Improved today. Seems to be worse at bedtime. No identified aggravating factors including temperature change, clothing, anxiety.   No one else in household has had similar rash.   No fever, abdominal pain, mental status changes.   Appetite waxes and wanes. Daughter reports she is eating less but when she does eat that she is eating more junk food. She wears dentures and chewing is difficult. Swallowing is difficult. [name] reports esophageal dilatation approx 10 years ago. No abdominal pain, constipation, vomiting. Daughter reports she will spit up after a meal.Patient endorses epigastric burning.     She hasnt liked Boost or Ensure in the past.  No new medications. Daughter has changed detergent to detergent for sensitive skin without change in rash.  Tried oral benadryl, topical cortizone cream with temporarilty relief.   Patient wears Depends.   Delusion and paranoia- Follows with [name] and compliant with abilify, lexapro, buspirone  Lewy body dementia- following with neurology, [name]. She is not taking either rivastigmine or aricept as she had diarrhea with both, since resolved.     HISTORY:  Past Medical History:  Diagnosis Date   Anxiety    Depression    GERD (gastroesophageal reflux disease)    Hypercalcemia    Pneumonia    Sepsis ([place])    Sleep apnea    SOB (shortness of breath) on exertion    Past Surgical History:  Procedure Laterality Date   ENDOMETRIAL ABLATION     EYE SURGERY  2011   left cataract   KNEE ARTHROSCOPY Left 05/03/2015   Procedure: left knee arthroscopy, partial medial menisectomy, chondroplasty;  Surgeon: Dereck Leep,  MD;  Location: [facility] ORS;  Service: Orthopedics;  Laterality: Left;   TONSILLECTOMY     Family History  Problem Relation Age of Onset   Cancer Mother 54       ovarian    Pernicious anemia Mother    COPD Father        nonsmoker   Cancer Cousin        ovarian ca    Allergies: Codeine, Darvon [propoxyphene hcl], and Propoxyphene Current Outpatient Medications on File Prior to Visit  Medication Sig Dispense Refill   busPIRone (BUSPAR) 5 MG tablet Take 1 tablet (5 mg total) by mouth 2 (two) times daily. 180 tablet 3   Cholecalciferol (VITAMIN D-3 PO) Take 1 capsule by mouth daily. 500iu     escitalopram (LEXAPRO) 10 MG tablet Take 1 tablet (10 mg total) by mouth daily. 90 tablet 3   gentamicin ointment (GARAMYCIN) 0.1 % Apply topically 3 (three) times daily.     ipratropium (ATROVENT) 0.03 % nasal spray SMARTSIG:1-2 Spray(s) Both Nares 3 Times Daily PRN     loperamide (IMODIUM) 2 MG capsule Take 2 mg by mouth 2 (two) times daily.     triamcinolone cream (KENALOG) 0.1 % Apply topically 2 (two) times daily. 454 g 1   vitamin B-12 (CYANOCOBALAMIN) 1000 MCG tablet Take by mouth.     No current facility-administered medications on file prior to visit.    Social History   Tobacco Use   Smoking  status: Never   Smokeless tobacco: Never  Vaping Use   Vaping Use: Never used  Substance Use Topics   Alcohol use: No   Drug use: No    Review of Systems  Constitutional:  Positive for unexpected weight change. Negative for chills and fever.  Respiratory:  Negative for cough.   Cardiovascular:  Negative for chest pain and palpitations.  Gastrointestinal:  Negative for abdominal distention, abdominal pain, constipation, diarrhea, nausea and vomiting.  Skin:  Positive for rash.     Objective:    BP 104/68 (BP Location: Left Arm, Patient Position: Sitting, Cuff Size: Large)   Pulse 62   Temp 98.2 F (36.8 C) (Oral)   Ht 5\' 2"  (1.575 m)   Wt 175 lb 3.2 oz (79.5 kg)   SpO2 98%   BMI  32.04 kg/m  BP Readings from Last 3 Encounters:  02/19/21 104/68  11/13/20 (!) 112/58  05/12/20 (!) 118/52   Wt Readings from Last 3 Encounters:  02/19/21 175 lb 3.2 oz (79.5 kg)  11/13/20 182 lb 11 oz (82.9 kg)  08/14/20 190 lb (86.2 kg)    Physical Exam Vitals reviewed.  Constitutional:      Appearance: She is well-developed.  Eyes:     Conjunctiva/sclera: Conjunctivae normal.  Cardiovascular:     Rate and Rhythm: Normal rate and regular rhythm.     Pulses: Normal pulses.     Heart sounds: Normal heart sounds.  Pulmonary:     Effort: Pulmonary effort is normal.     Breath sounds: Normal breath sounds. No wheezing, rhonchi or rales.  Skin:    General: Skin is warm and dry.          Comments: Mild erythema without discrete lesions, exudate noted under bilateral breast and crease of buttucks. Skin intact. No red streaks.   Neurological:     Mental Status: She is alert.  Psychiatric:        Speech: Speech normal.        Behavior: Behavior normal.        Thought Content: Thought content normal.       Assessment & Plan:   Problem List Items Addressed This Visit       Digestive   GERD (gastroesophageal reflux disease)    H/o GERD, dysphagia.Advised GI consult as concerned for difficultly swallowing and weight loss, further complicated by dementia. Patient declines today. She was agreeable to trial of pepcid ac. Close follow up       Relevant Medications   famotidine (PEPCID) 20 MG tablet     Nervous and Auditory   Dementia with behavioral disturbance (HCC)    Chronic, stable. Discussed consideration for remeron in consult with [name] if weight loss were to continue. Continue regimen as prescribed by [name].          Musculoskeletal and Integument   Rash - Primary    Location and description of rash c/w intertrigo candidiasis. No evidence of infection. Counseled on how to keep areas clean, dry. Trial of topical antifungal with steroid. Advised trial of  pepcid ac and zyrtec for histamine blockade in setting of pruritus. Close follow up.        Relevant Medications   famotidine (PEPCID) 20 MG tablet   cetirizine (ZYRTEC) 5 MG tablet   clotrimazole-betamethasone (LOTRISONE) cream   Other Relevant Orders   Comprehensive metabolic panel   Other Visit Diagnoses     Hyperlipidemia, unspecified hyperlipidemia type  Relevant Orders   Lipid panel        I have discontinued Kyree M. Fudala's rivastigmine and donepezil. I am also having her start on famotidine, cetirizine, and clotrimazole-betamethasone. Additionally, I am having her maintain her Cholecalciferol (VITAMIN D-3 PO), loperamide, triamcinolone cream, vitamin B-12, ipratropium, gentamicin ointment, busPIRone, and escitalopram.   Meds ordered this encounter  Medications   famotidine (PEPCID) 20 MG tablet    Sig: Take 1 tablet (20 mg total) by mouth every evening.    Dispense:  90 tablet    Refill:  0    Order Specific Question:   Supervising Provider    Answer:   Deborra Medina L [[id number]]   cetirizine (ZYRTEC) 5 MG tablet    Sig: Take 1 tablet (5 mg total) by mouth daily.    Dispense:  90 tablet    Refill:  1    Order Specific Question:   Supervising Provider    Answer:   Deborra Medina L [[id number]]   clotrimazole-betamethasone (LOTRISONE) cream    Sig: Apply 1 application topically daily.    Dispense:  30 g    Refill:  3    Order Specific Question:   Supervising Provider    Answer:   Crecencio Mc [[id number]]     Return precautions given.   Risks, benefits, and alternatives of the medications and treatment plan prescribed today were discussed, and patient expressed understanding.   Education regarding symptom management and diagnosis given to patient on AVS.  Continue to follow with Burnard Hawthorne, FNP for routine health maintenance.   [name] and I agreed with plan.   [name], FNP

## 2021-02-19 NOTE — Assessment & Plan Note (Signed)
Location and description of rash c/w intertrigo candidiasis. No evidence of infection. Counseled on how to keep areas clean, dry. Trial of topical antifungal with steroid. Advised trial of pepcid ac and zyrtec for histamine blockade in setting of pruritus. Close follow up.

## 2021-02-19 NOTE — Patient Instructions (Signed)
   Rash/Hives of unknown cause  May trial atarax- this is sedating H1 antihistamine- you may use this at bedtime , and may find it helpful, especially when combined with a nonsedating H1 antihistamine ( zyrtec or claritin) during the day. If you take the atarax- DO NOT also take Benadryl as these medications are in the same drug class and VERY sedating.    Tips on Aggravating factors --    These include:  ?Physical factors -  As an example, heat (hot showers, extreme humidity) is a common trigger for many , and tight clothing or straps can also aggravate symptoms.   ?Anti-inflammatory medications - Nonsteroidal anti-inflammatory drugs (NSAIDs) worsen symptoms   ?Stress - Patients often report more severe symptoms during periods of physical or psychologic stress   ?Variations in dietary habits and alcohol - Although food allergy is a rare cause of, some patients will report that variations in diet, particularly rich meals or spicy foods, will aggravate symptoms. Alcohol also aggravates symptoms in some.  If hives do not improve, please let me know and remember :   Histamine 1 blocker - zyrtec once daily  Histamine 2 blocker- Pepcid AC once daily   For rash, I suspect yeast infection :   Trial of   Practices aimed at minimizing moisture and friction in the involved area and reducing susceptibility to intertrigo are the mainstays of treatment. Typical beneficial practices include:  ?Daily cleansing of intertriginous skin with a mild cleanser followed by drying of affected area with a hair dryer on a cool setting  ?Aeration of affected area when feasible  ?Daily application of drying powders, such as powders composed of microporous cellulose  ?Use of absorbent material or clothing, such as cotton or merino wool, to separate skin in folds  ?Application of barrier creams in areas that may come in contact with urine or feces  ?Treatment of hyperhidrosis in the affected  area  ?Weight loss in persons who are overweight or obese  ?Appropriate treatment of coexisting diabetes mellitus  Patients with incontinence-associated intertrigo often improve with the use of barrier creams to protect against the contact irritancy of urine and stool:  ?Zinc oxide, petrolatum, or dimethicone topicals can be applied after cleaning the intertriginous area with mild soap and water and drying with a hair dryer on a cold setting.  ?Super absorbent diapers can wick moisture away from the skin.

## 2021-02-19 NOTE — Assessment & Plan Note (Signed)
Chronic, stable. Discussed consideration for remeron in consult with [name] if weight loss were to continue. Continue regimen as prescribed by [name].

## 2021-02-19 NOTE — Assessment & Plan Note (Signed)
H/o GERD, dysphagia.Advised GI consult as concerned for difficultly swallowing and weight loss, further complicated by dementia. Patient declines today. She was agreeable to trial of pepcid ac. Close follow up

## 2021-03-19 DIAGNOSIS — G3183 Dementia with Lewy bodies: Secondary | ICD-10-CM | POA: Diagnosis not present

## 2021-03-19 DIAGNOSIS — R413 Other amnesia: Secondary | ICD-10-CM | POA: Diagnosis not present

## 2021-03-19 DIAGNOSIS — L918 Other hypertrophic disorders of the skin: Secondary | ICD-10-CM | POA: Diagnosis not present

## 2021-03-19 DIAGNOSIS — R2689 Other abnormalities of gait and mobility: Secondary | ICD-10-CM | POA: Diagnosis not present

## 2021-03-23 ENCOUNTER — Telehealth (INDEPENDENT_AMBULATORY_CARE_PROVIDER_SITE_OTHER): Payer: Medicare Other | Admitting: Psychiatry

## 2021-03-23 ENCOUNTER — Other Ambulatory Visit: Payer: Self-pay

## 2021-03-23 ENCOUNTER — Encounter: Payer: Self-pay | Admitting: Psychiatry

## 2021-03-23 DIAGNOSIS — F0281 Dementia in other diseases classified elsewhere with behavioral disturbance: Secondary | ICD-10-CM | POA: Diagnosis not present

## 2021-03-23 DIAGNOSIS — F3342 Major depressive disorder, recurrent, in full remission: Secondary | ICD-10-CM | POA: Diagnosis not present

## 2021-03-23 DIAGNOSIS — G3183 Dementia with Lewy bodies: Secondary | ICD-10-CM

## 2021-03-23 NOTE — Progress Notes (Signed)
Virtual Visit via Video Note  I connected with [name] on 03/23/21 at 11:30 AM EDT by a video enabled telemedicine application and verified that I am speaking with the correct person using two identifiers.  Location Provider Location : [facility] Patient Location : Home  Participants: Patient ,Daughter [name], Provider   I discussed the limitations of evaluation and management by telemedicine and the availability of in person appointments. The patient expressed understanding and agreed to proceed.    I discussed the assessment and treatment plan with the patient. The patient was provided an opportunity to ask questions and all were answered. The patient agreed with the plan and demonstrated an understanding of the instructions.   The patient was advised to call back or seek an in-person evaluation if the symptoms worsen or if the condition fails to improve as anticipated.  Video connection was lost at less than 50% of the duration of the visit, at which time the remainder of the visit was completed through audio only     [facility] MD OP Progress Note  03/23/2021 11:37 AM [name]  MRN:  [id number]  Chief Complaint:  Chief Complaint   Follow-up; Anxiety; Depression    HPI: [name] is a 87 year old Caucasian female, has a history of MDD, dementia, gastroesophageal reflux disease, obstructive sleep apnea compliant on CPAP, osteoporosis, chronic kidney disease stage III was evaluated by telemedicine today.  Collateral information was provided by her daughter-Jean  Patient today appeared to be alert, cooperative in session.  She however continues to be a limited historian.  She reports she is currently okay.  According to daughter patient recently talked about a grandchild who is missing and kept asking whether she was found.  None of this is true.  Daughter wonders whether she had a dream.  She however does not seem to be too preoccupied with these thoughts at this time.   Daughter agrees to monitor her.    She is compliant on medications.  She does have anxiety on and off however overall managing okay.  She does have excessive sleepiness during the day, likely due to her psychotropic medications.  She was also recently started on Namenda which she started taking today.  Patient with memory loss which likely could be worsening.  Namenda likely could also cause drowsiness during the day.  Patient did not express any suicidality, homicidality or did not seem to be preoccupied with any delusions at this visit.  Patient as well as daughter denies any other concerns.    Visit Diagnosis:    ICD-10-CM   1. MDD (major depressive disorder), recurrent, in full remission ([place])  F33.42     2. Lewy body dementia with behavioral disturbance (HCC)  G31.83    F02.81       Past Psychiatric History: Reviewed past psychiatric history from progress note on 01/04/2020  Past Medical History:  Past Medical History:  Diagnosis Date   Anxiety    Depression    GERD (gastroesophageal reflux disease)    Hypercalcemia    Pneumonia    Sepsis ([place])    Sleep apnea    SOB (shortness of breath) on exertion     Past Surgical History:  Procedure Laterality Date   ENDOMETRIAL ABLATION     EYE SURGERY  2011   left cataract   KNEE ARTHROSCOPY Left 05/03/2015   Procedure: left knee arthroscopy, partial medial menisectomy, chondroplasty;  Surgeon: Dereck Leep, MD;  Location: [facility] ORS;  Service: Orthopedics;  Laterality: Left;   TONSILLECTOMY      Family Psychiatric History: Reviewed family psychiatric history from progress note on 01/04/2020  Family History:  Family History  Problem Relation Age of Onset   Cancer Mother 19       ovarian    Pernicious anemia Mother    COPD Father        nonsmoker   Cancer Cousin        ovarian ca    Social History: Reviewed social history from progress note on 01/04/2020 Social History   Socioeconomic History   Marital status:  Widowed    Spouse name: Not on file   Number of children: 5   Years of education: Not on file   Highest education level: Not on file  Occupational History   Occupation: retired  Tobacco Use   Smoking status: Never   Smokeless tobacco: Never  Vaping Use   Vaping Use: Never used  Substance and Sexual Activity   Alcohol use: No   Drug use: No   Sexual activity: Not Currently  Other Topics Concern   Not on file  Social History Narrative   Regular exercise: not really   Caffeine use: some   Daughter lives with her.    Social Determinants of Health   Financial Resource Strain: Low Risk    Difficulty of Paying Living Expenses: Not hard at all  Food Insecurity: No Food Insecurity   Worried About Charity fundraiser in the Last Year: Never true   [place] in the Last Year: Never true  Transportation Needs: No Transportation Needs   Lack of Transportation (Medical): No   Lack of Transportation (Non-Medical): No  Physical Activity: Unknown   Days of Exercise per Week: 0 days   Minutes of Exercise per Session: Not on file  Stress: No Stress Concern Present   Feeling of Stress : Not at all  Social Connections: Unknown   Frequency of Communication with Friends and Family: Not on file   Frequency of Social Gatherings with Friends and Family: More than three times a week   Attends Religious Services: Not on file   Active Member of Clubs or Organizations: Not on file   Attends Archivist Meetings: Not on file   Marital Status: Widowed    Allergies:  Allergies  Allergen Reactions   Codeine Other (See Comments)    Crying spells   Darvon [Propoxyphene Hcl] Other (See Comments)    Crying spells   Propoxyphene Other (See Comments)    Crying, altered mental status    Metabolic Disorder Labs: Lab Results  Component Value Date   HGBA1C 6.0 08/21/2017   Lab Results  Component Value Date   PROLACTIN 11.9 05/12/2020   Lab Results  Component Value Date   CHOL  166 02/19/2021   TRIG 139.0 02/19/2021   HDL 44.40 02/19/2021   CHOLHDL 4 02/19/2021   VLDL 27.8 02/19/2021   LDLCALC 93 02/19/2021   LDLCALC 120 (H) 05/12/2020   Lab Results  Component Value Date   TSH 3.68 05/12/2020   TSH 3.73 08/21/2017    Therapeutic Level Labs: No results found for: LITHIUM No results found for: VALPROATE No components found for:  CBMZ  Current Medications: Current Outpatient Medications  Medication Sig Dispense Refill   busPIRone (BUSPAR) 5 MG tablet Take 1 tablet (5 mg total) by mouth 2 (two) times daily. 180 tablet 3   cetirizine (ZYRTEC) 5 MG tablet Take 1 tablet (5  mg total) by mouth daily. 90 tablet 1   Cholecalciferol (VITAMIN D-3 PO) Take 1 capsule by mouth daily. 500iu     clotrimazole-betamethasone (LOTRISONE) cream Apply 1 application topically daily. 30 g 3   escitalopram (LEXAPRO) 10 MG tablet Take 1 tablet (10 mg total) by mouth daily. 90 tablet 3   famotidine (PEPCID) 20 MG tablet Take 1 tablet (20 mg total) by mouth every evening. 90 tablet 0   gentamicin ointment (GARAMYCIN) 0.1 % Apply topically 3 (three) times daily.     ipratropium (ATROVENT) 0.03 % nasal spray SMARTSIG:1-2 Spray(s) Both Nares 3 Times Daily PRN     loperamide (IMODIUM) 2 MG capsule Take 2 mg by mouth 2 (two) times daily.     triamcinolone cream (KENALOG) 0.1 % Apply topically 2 (two) times daily. 454 g 1   vitamin B-12 (CYANOCOBALAMIN) 1000 MCG tablet Take by mouth.     No current facility-administered medications for this visit.     Musculoskeletal: Strength & Muscle Tone:  UTA Gait & Station:  UTA Patient leans: Backward  Psychiatric Specialty Exam: Review of Systems  Unable to perform ROS: Dementia   There were no vitals taken for this visit.There is no height or weight on file to calculate BMI.  General Appearance: UTA  Eye Contact:  UTA  Speech:  Normal Rate  Volume:  Decreased  Mood:  Euthymic  Affect:  UTA  Thought Process:  Linear and Descriptions  of Associations: Intact  Orientation:  Other:  self, situation  Thought Content: Logical   Suicidal Thoughts:  No  Homicidal Thoughts:  No  Memory:  Immediate;   Fair Recent;   limited Remote;   limited  Judgement:  Fair  Insight:  Shallow  Psychomotor Activity:  UTA  Concentration:  Concentration: Fair and Attention Span: Fair  Recall:  Poor  Fund of Knowledge: Poor  Language: Fair  Akathisia:  No  Handed:  Right  AIMS (if indicated): done  Assets:  Wellsite geologist  ADL's:  Intact  Cognition: Impaired,  mild  Sleep:  Fair   Screenings: [place] Office Visit from 06/16/2017 in [place] Office Visit from 02/07/2016 in [place] Total Score 0 [place] Visit from 10/09/2015 in [place]  Total Score (max 30 points ) 27      PHQ2-9    Flowsheet Row Video Visit from 10/26/2020 in [place] from 06/28/2020 in [place] Office Visit from 05/12/2020 in [place] Visit from 09/18/2017 in [place] Office Visit from 05/30/2016 in [place]  PHQ-2 Total Score 1 0 0 0 0      Flowsheet Row Video Visit from 10/26/2020 in [place] No Risk        Assessment and Plan: [name] is a 87 year old Caucasian female, lives in [place], has a history of MDD, dementia, anxiety disorder was evaluated by telemedicine today.  Patient is currently stable although it is likely medications could be causing drowsiness during the day.  Plan as noted below.  Plan MDD in full remission Lexapro 10 mg p.o. daily BuSpar 5 mg p.o. daily Advised to start taking medications that could be contributing to drowsiness at the  end of the day.   Continue  follow-up with neurology-for dementia.  Reviewed notes for Ms.Konz-For moderate mixed with dementia-Alzheimer's and vascular dementia with psychology features versus component of Lewy body dementia-start memantine 5 mg tablet.  For delusions and paranoia related to mental health concerns and now may be exacerbated by dementia.  Right hand tremor-resting-drug-induced versus parkinsonian symptoms.  Balance-Home health physical therapy for balance therapy as it was very effective in the past. Vitamin B12 deficiency-oral B12 supplementation, obstructive sleep apnea-uses CPAP.  Collateral information obtained from daughter as noted above.  Follow-up in clinic in 3 months or sooner if needed.   I have spent at least 20 minutes non face to face with patient today .     This note was generated in part or whole with voice recognition software. Voice recognition is usually quite accurate but there are transcription errors that can and very often do occur. I apologize for any typographical errors that were not detected and corrected.       Ursula Alert, MD 03/23/2021, 11:37 AM

## 2021-03-28 DIAGNOSIS — E079 Disorder of thyroid, unspecified: Secondary | ICD-10-CM | POA: Diagnosis not present

## 2021-03-28 DIAGNOSIS — F22 Delusional disorders: Secondary | ICD-10-CM | POA: Diagnosis not present

## 2021-03-28 DIAGNOSIS — R251 Tremor, unspecified: Secondary | ICD-10-CM | POA: Diagnosis not present

## 2021-03-28 DIAGNOSIS — I059 Rheumatic mitral valve disease, unspecified: Secondary | ICD-10-CM | POA: Diagnosis not present

## 2021-03-28 DIAGNOSIS — F0280 Dementia in other diseases classified elsewhere without behavioral disturbance: Secondary | ICD-10-CM | POA: Diagnosis not present

## 2021-03-28 DIAGNOSIS — G4733 Obstructive sleep apnea (adult) (pediatric): Secondary | ICD-10-CM | POA: Diagnosis not present

## 2021-03-28 DIAGNOSIS — E559 Vitamin D deficiency, unspecified: Secondary | ICD-10-CM | POA: Diagnosis not present

## 2021-03-28 DIAGNOSIS — E538 Deficiency of other specified B group vitamins: Secondary | ICD-10-CM | POA: Diagnosis not present

## 2021-03-28 DIAGNOSIS — M1990 Unspecified osteoarthritis, unspecified site: Secondary | ICD-10-CM | POA: Diagnosis not present

## 2021-03-28 DIAGNOSIS — F32A Depression, unspecified: Secondary | ICD-10-CM | POA: Diagnosis not present

## 2021-03-28 DIAGNOSIS — I872 Venous insufficiency (chronic) (peripheral): Secondary | ICD-10-CM | POA: Diagnosis not present

## 2021-03-28 DIAGNOSIS — F0150 Vascular dementia without behavioral disturbance: Secondary | ICD-10-CM | POA: Diagnosis not present

## 2021-03-28 DIAGNOSIS — R441 Visual hallucinations: Secondary | ICD-10-CM | POA: Diagnosis not present

## 2021-03-28 DIAGNOSIS — G309 Alzheimer's disease, unspecified: Secondary | ICD-10-CM | POA: Diagnosis not present

## 2021-03-28 DIAGNOSIS — M810 Age-related osteoporosis without current pathological fracture: Secondary | ICD-10-CM | POA: Diagnosis not present

## 2021-03-28 DIAGNOSIS — N189 Chronic kidney disease, unspecified: Secondary | ICD-10-CM | POA: Diagnosis not present

## 2021-04-02 ENCOUNTER — Encounter: Payer: Self-pay | Admitting: Family

## 2021-04-02 ENCOUNTER — Other Ambulatory Visit: Payer: Self-pay

## 2021-04-02 ENCOUNTER — Ambulatory Visit (INDEPENDENT_AMBULATORY_CARE_PROVIDER_SITE_OTHER): Payer: Medicare Other | Admitting: Family

## 2021-04-02 DIAGNOSIS — G3183 Dementia with Lewy bodies: Secondary | ICD-10-CM

## 2021-04-02 DIAGNOSIS — F329 Major depressive disorder, single episode, unspecified: Secondary | ICD-10-CM

## 2021-04-02 DIAGNOSIS — F0281 Dementia in other diseases classified elsewhere with behavioral disturbance: Secondary | ICD-10-CM | POA: Diagnosis not present

## 2021-04-02 NOTE — Assessment & Plan Note (Signed)
Chronic, stable.  Following with psychiatry.  Will follow

## 2021-04-02 NOTE — Patient Instructions (Addendum)
Tdap ( or tetanus) and Shingles vaccine , Shingrex vaccine, at local pharmacy. You would do these at separate at visits.   Nice to you!

## 2021-04-02 NOTE — Progress Notes (Signed)
Subjective:    Patient ID: [name], female    DOB: [birth year], 87 y.o.   MRN: [id number]  CC: [name] is a 87 y.o. female who presents today for follow up.   HPI: Accompanied by daughter  Feels well today No new complaints.  Eating 3 meals per day. Using walker. No falls.  Home PT has come to house to evaluate her last week.  Patient continues to follow with Dr. Shea Evans, major depressive disorder.She is compliant with buspar and lexapro.  Daughter feels regimen is working well. She follows with neurology for moderate mixed dementia.  Pt ordered and Memantine 5 mg was started at follow-up.  She is no longer on rivastigmine. 03/19/21.  No changes to memory at this time.  Dementia appears to be at baseline per daughter.    HISTORY:  Past Medical History:  Diagnosis Date   Anxiety    Depression    GERD (gastroesophageal reflux disease)    Hypercalcemia    Pneumonia    Sepsis ([place])    Sleep apnea    SOB (shortness of breath) on exertion    Past Surgical History:  Procedure Laterality Date   ENDOMETRIAL ABLATION     EYE SURGERY  2011   left cataract   KNEE ARTHROSCOPY Left 05/03/2015   Procedure: left knee arthroscopy, partial medial menisectomy, chondroplasty;  Surgeon: Dereck Leep, MD;  Location: [facility] ORS;  Service: Orthopedics;  Laterality: Left;   TONSILLECTOMY     Family History  Problem Relation Age of Onset   Cancer Mother 72       ovarian    Pernicious anemia Mother    COPD Father        nonsmoker   Cancer Cousin        ovarian ca    Allergies: Codeine, Darvon [propoxyphene hcl], and Propoxyphene Current Outpatient Medications on File Prior to Visit  Medication Sig Dispense Refill   busPIRone (BUSPAR) 5 MG tablet Take 1 tablet (5 mg total) by mouth 2 (two) times daily. 180 tablet 3   cetirizine (ZYRTEC) 5 MG tablet Take 1 tablet (5 mg total) by mouth daily. 90 tablet 1   Cholecalciferol (VITAMIN D-3 PO) Take 1 capsule by mouth daily. 500iu      clotrimazole-betamethasone (LOTRISONE) cream Apply 1 application topically daily. 30 g 3   escitalopram (LEXAPRO) 10 MG tablet Take 1 tablet (10 mg total) by mouth daily. 90 tablet 3   famotidine (PEPCID) 20 MG tablet Take 1 tablet (20 mg total) by mouth every evening. 90 tablet 0   gentamicin ointment (GARAMYCIN) 0.1 % Apply topically 3 (three) times daily.     ipratropium (ATROVENT) 0.03 % nasal spray SMARTSIG:1-2 Spray(s) Both Nares 3 Times Daily PRN     loperamide (IMODIUM) 2 MG capsule Take 2 mg by mouth 2 (two) times daily.     memantine (NAMENDA) 5 MG tablet Take 1 tablet by mouth 2 (two) times daily.     triamcinolone cream (KENALOG) 0.1 % Apply topically 2 (two) times daily. 454 g 1   vitamin B-12 (CYANOCOBALAMIN) 1000 MCG tablet Take by mouth.     No current facility-administered medications on file prior to visit.    Social History   Tobacco Use   Smoking status: Never   Smokeless tobacco: Never  Vaping Use   Vaping Use: Never used  Substance Use Topics   Alcohol use: No   Drug use: No    Review of Systems  Constitutional:  Negative for chills and fever.  Respiratory:  Negative for cough.   Cardiovascular:  Negative for chest pain and palpitations.  Gastrointestinal:  Negative for nausea and vomiting.  Psychiatric/Behavioral:  Negative for agitation and behavioral problems.      Objective:    BP 112/60   Pulse 82   Temp 97.7 F (36.5 C)   Ht 5' 2.01" (1.575 m)   Wt 178 lb (80.7 kg)   SpO2 96%   BMI 32.55 kg/m  BP Readings from Last 3 Encounters:  04/02/21 112/60  02/19/21 104/68  11/13/20 (!) 112/58   Wt Readings from Last 3 Encounters:  04/02/21 178 lb (80.7 kg)  02/19/21 175 lb 3.2 oz (79.5 kg)  11/13/20 182 lb 11 oz (82.9 kg)    Physical Exam Vitals reviewed.  Constitutional:      Appearance: She is well-developed.  Eyes:     Conjunctiva/sclera: Conjunctivae normal.  Cardiovascular:     Rate and Rhythm: Normal rate and regular rhythm.      Pulses: Normal pulses.     Heart sounds: Normal heart sounds.  Pulmonary:     Effort: Pulmonary effort is normal.     Breath sounds: Normal breath sounds. No wheezing, rhonchi or rales.  Skin:    General: Skin is warm and dry.  Neurological:     Mental Status: She is alert.  Psychiatric:        Speech: Speech normal.        Behavior: Behavior normal.        Thought Content: Thought content normal.       Assessment & Plan:   Problem List Items Addressed This Visit       Nervous and Auditory   Lewy body dementia with behavioral disturbance (HCC)    Chronic, stable.  She is now currently on the memantine, participating in physical therapy.  Will follow        Other   Major depressive disorder with single episode    Chronic, stable.  Following with psychiatry.  Will follow        I am having Meka M. Juleen China maintain her Cholecalciferol (VITAMIN D-3 PO), loperamide, triamcinolone cream, vitamin B-12, ipratropium, gentamicin ointment, busPIRone, escitalopram, famotidine, cetirizine, clotrimazole-betamethasone, and memantine.   No orders of the defined types were placed in this encounter.   Return precautions given.   Risks, benefits, and alternatives of the medications and treatment plan prescribed today were discussed, and patient expressed understanding.   Education regarding symptom management and diagnosis given to patient on AVS.  Continue to follow with Burnard Hawthorne, FNP for routine health maintenance.   [name] and I agreed with plan.   [name], FNP

## 2021-04-02 NOTE — Assessment & Plan Note (Signed)
Chronic, stable.  She is now currently on the memantine, participating in physical therapy.  Will follow

## 2021-04-06 ENCOUNTER — Ambulatory Visit: Payer: Medicare Other | Admitting: Psychiatry

## 2021-04-23 ENCOUNTER — Other Ambulatory Visit: Payer: Self-pay

## 2021-04-23 ENCOUNTER — Telehealth: Payer: Self-pay | Admitting: Family

## 2021-04-23 DIAGNOSIS — R29898 Other symptoms and signs involving the musculoskeletal system: Secondary | ICD-10-CM

## 2021-04-23 NOTE — Telephone Encounter (Signed)
Patients daughter is calling in to request a prescription for a rollator to assist her mother with walking and this is in reference to her physical therapy.Please advise.

## 2021-04-23 NOTE — Telephone Encounter (Signed)
I have printed DME & placed in folder.

## 2021-04-24 NOTE — Telephone Encounter (Signed)
Placed up front

## 2021-04-24 NOTE — Telephone Encounter (Signed)
Patient daughter calling back in, would like this placed upfront for pick up

## 2021-04-24 NOTE — Telephone Encounter (Signed)
LM for daughter to call back. I have DME order printed that she can pick up. I have in my patient paperwork folder.

## 2021-05-14 DIAGNOSIS — F0280 Dementia in other diseases classified elsewhere without behavioral disturbance: Secondary | ICD-10-CM | POA: Diagnosis not present

## 2021-05-14 DIAGNOSIS — F0150 Vascular dementia without behavioral disturbance: Secondary | ICD-10-CM | POA: Diagnosis not present

## 2021-05-14 DIAGNOSIS — R441 Visual hallucinations: Secondary | ICD-10-CM | POA: Diagnosis not present

## 2021-05-14 DIAGNOSIS — G309 Alzheimer's disease, unspecified: Secondary | ICD-10-CM | POA: Diagnosis not present

## 2021-05-15 DIAGNOSIS — R531 Weakness: Secondary | ICD-10-CM | POA: Diagnosis not present

## 2021-05-15 DIAGNOSIS — G3183 Dementia with Lewy bodies: Secondary | ICD-10-CM | POA: Diagnosis not present

## 2021-05-18 ENCOUNTER — Telehealth: Payer: Self-pay

## 2021-05-18 NOTE — Telephone Encounter (Signed)
Confirmed signed and faxed [facility] admission note report. Report has been sent to scan.

## 2021-06-13 ENCOUNTER — Emergency Department
Admission: EM | Admit: 2021-06-13 | Discharge: 2021-06-14 | Disposition: A | Discharge status: Home / Self Care | Payer: Medicare Other | Attending: Emergency Medicine | Admitting: Emergency Medicine

## 2021-06-13 ENCOUNTER — Telehealth: Payer: Self-pay | Admitting: Family

## 2021-06-13 ENCOUNTER — Other Ambulatory Visit: Payer: Self-pay

## 2021-06-13 ENCOUNTER — Telehealth: Payer: Self-pay

## 2021-06-13 ENCOUNTER — Emergency Department: Payer: Medicare Other

## 2021-06-13 ENCOUNTER — Encounter: Payer: Self-pay | Admitting: Emergency Medicine

## 2021-06-13 DIAGNOSIS — N183 Chronic kidney disease, stage 3 unspecified: Secondary | ICD-10-CM | POA: Diagnosis not present

## 2021-06-13 DIAGNOSIS — R1031 Right lower quadrant pain: Secondary | ICD-10-CM

## 2021-06-13 DIAGNOSIS — M25551 Pain in right hip: Secondary | ICD-10-CM | POA: Diagnosis not present

## 2021-06-13 DIAGNOSIS — F03918 Unspecified dementia, unspecified severity, with other behavioral disturbance: Secondary | ICD-10-CM | POA: Insufficient documentation

## 2021-06-13 DIAGNOSIS — M546 Pain in thoracic spine: Secondary | ICD-10-CM | POA: Diagnosis not present

## 2021-06-13 DIAGNOSIS — K219 Gastro-esophageal reflux disease without esophagitis: Secondary | ICD-10-CM | POA: Diagnosis not present

## 2021-06-13 DIAGNOSIS — R102 Pelvic and perineal pain: Secondary | ICD-10-CM | POA: Diagnosis not present

## 2021-06-13 DIAGNOSIS — D259 Leiomyoma of uterus, unspecified: Secondary | ICD-10-CM | POA: Diagnosis not present

## 2021-06-13 DIAGNOSIS — M25559 Pain in unspecified hip: Secondary | ICD-10-CM

## 2021-06-13 DIAGNOSIS — N179 Acute kidney failure, unspecified: Secondary | ICD-10-CM | POA: Diagnosis not present

## 2021-06-13 DIAGNOSIS — M545 Low back pain, unspecified: Secondary | ICD-10-CM | POA: Diagnosis not present

## 2021-06-13 DIAGNOSIS — K5730 Diverticulosis of large intestine without perforation or abscess without bleeding: Secondary | ICD-10-CM | POA: Diagnosis not present

## 2021-06-13 DIAGNOSIS — M1611 Unilateral primary osteoarthritis, right hip: Secondary | ICD-10-CM | POA: Diagnosis not present

## 2021-06-13 DIAGNOSIS — F0390 Unspecified dementia without behavioral disturbance: Secondary | ICD-10-CM | POA: Diagnosis not present

## 2021-06-13 DIAGNOSIS — R109 Unspecified abdominal pain: Secondary | ICD-10-CM

## 2021-06-13 LAB — BASIC METABOLIC PANEL
Anion gap: 8 (ref 5–15)
BUN: 13 mg/dL (ref 8–23)
CO2: 27 mmol/L (ref 22–32)
Calcium: 10.6 mg/dL — ABNORMAL HIGH (ref 8.9–10.3)
Chloride: 104 mmol/L (ref 98–111)
Creatinine, Ser: 1.16 mg/dL — ABNORMAL HIGH (ref 0.44–1.00)
GFR, Estimated: 46 mL/min — ABNORMAL LOW (ref >60)
Glucose, Bld: 98 mg/dL (ref 70–99)
Potassium: 3.9 mmol/L (ref 3.5–5.1)
Sodium: 139 mmol/L (ref 135–145)

## 2021-06-13 LAB — TROPONIN I (HIGH SENSITIVITY)
Troponin I (High Sensitivity): 5 ng/L (ref <18)
Troponin I (High Sensitivity): 5 ng/L (ref <18)

## 2021-06-13 LAB — CBC
HCT: 46.3 % — ABNORMAL HIGH (ref 36.0–46.0)
Hemoglobin: 14.8 g/dL (ref 12.0–15.0)
MCH: 29.1 pg (ref 26.0–34.0)
MCHC: 32 g/dL (ref 30.0–36.0)
MCV: 91 fL (ref 80.0–100.0)
Platelets: 283 10*3/uL (ref 150–400)
RBC: 5.09 MIL/uL (ref 3.87–5.11)
RDW: 13.9 % (ref 11.5–15.5)
WBC: 6.2 10*3/uL (ref 4.0–10.5)
nRBC: 0 % (ref 0.0–0.2)

## 2021-06-13 MED ORDER — SODIUM CHLORIDE 0.9 % IV BOLUS
1000.0000 mL | Freq: Once | INTRAVENOUS | Status: AC
  Administered 2021-06-14: 1000 mL via INTRAVENOUS

## 2021-06-13 NOTE — Telephone Encounter (Signed)
FYI I called and spoke with [name] patient's daughter. She stated that her mom for over a week now has been complaining of right sided pain. She said that it is hard to tell with her mom exactly what is going on, but this has been effecting her mobility. She was unsure if musculoskeletal, possible UTI or something else. She said that he mom has trouble getting out of bed & complains of the pain on the ride side. She does not have any nausea, vomiting, fever though. I advised that to be in the safe side I felt that patient should be seen sooner than we could get her in to our office especially since patient is not able to communicate well. Pt's daughter will try to take patient to [facility] in [place]. She did not want to wait the long hours at other UC or ED. I asked that she keep [place] updated.

## 2021-06-13 NOTE — ED Provider Notes (Signed)
[facility] Emergency Department Provider Note   ____________________________________________   Event Date/Time   First MD Initiated Contact with Patient 06/13/21 2307     (approximate)  I have reviewed the triage vital signs and the nursing notes.   HISTORY  Chief Complaint Flank Pain  Level V caveat: Limited by dementia History provided by patient's daughter  HPI [name] is a 87 y.o. female who presents to the ED from home with a chief complaint of right flank and abdominal pain.  Patient with a history of dementia.  Daughter reports patient with right flank pain longer than 2 months.  Does not recall fall/trauma or injury prior to onset of pain.  Patient ambulates with walker.  Denies fever, cough, chest pain, shortness of breath, nausea or vomiting.     Past Medical History:  Diagnosis Date   Anxiety    Depression    GERD (gastroesophageal reflux disease)    Hypercalcemia    Pneumonia    Sepsis ([place])    Sleep apnea    SOB (shortness of breath) on exertion     Patient Active Problem List   Diagnosis Date Noted   Tremor 05/12/2020   High risk medication use 03/30/2020   At risk for prolonged QT interval syndrome 03/30/2020   MDD (major depressive disorder), recurrent, in full remission ([place]) 06/25/2019   Anxiety state 06/25/2019   Bereavement 06/25/2019   Dementia with behavioral disturbance 06/25/2019   Lewy body dementia with behavioral disturbance ([place]) 01/09/2018   Vitamin D deficiency 01/09/2018   Dizziness 09/18/2017   Leg cramps 09/18/2017   Osteoporosis 09/18/2017   CKD (chronic kidney disease) stage 3, GFR 30-59 ml/min (HCC) 09/18/2017   Prediabetes 09/18/2017   Hypercalcemia 08/24/2017   Rash 05/30/2016   Leg swelling 05/30/2016   Allergic rhinitis 12/03/2015   UTI symptoms 08/30/2015   Right flank pain 08/30/2015   Hearing loss 06/26/2015   Major depressive disorder with single episode 04/15/2015   OSA  (obstructive sleep apnea) 10/27/2014   Left knee pain 10/27/2014   Obstructive apnea 10/27/2014   Urge incontinence of urine 04/22/2014   Incomplete uterine prolapse 04/22/2014   Cystocele 12/29/2013   Onychomycosis 08/09/2013   Dermatophytic onychia 08/09/2013   Primary hyperparathyroidism ([place]) 05/28/2013   Medicare annual wellness visit, subsequent 05/07/2013   Stasis dermatitis 05/07/2013   Varicose veins of lower extremities with inflammation 05/07/2013   Diarrhea 11/11/2012   Closed fracture of humerus, supracondylar 10/09/2012   GERD (gastroesophageal reflux disease) 06/29/2012   Acid reflux 06/29/2012   Depression     Past Surgical History:  Procedure Laterality Date   ENDOMETRIAL ABLATION     EYE SURGERY  2011   left cataract   KNEE ARTHROSCOPY Left 05/03/2015   Procedure: left knee arthroscopy, partial medial menisectomy, chondroplasty;  Surgeon: Dereck Leep, MD;  Location: [facility] ORS;  Service: Orthopedics;  Laterality: Left;   TONSILLECTOMY      Prior to Admission medications   Medication Sig Start Date End Date Taking? Authorizing Provider  HYDROcodone-acetaminophen (NORCO) 5-325 MG tablet Take 1 tablet by mouth every 6 (six) hours as needed for moderate pain. 06/14/21  Yes Paulette Blanch, MD  busPIRone (BUSPAR) 5 MG tablet Take 1 tablet (5 mg total) by mouth 2 (two) times daily. 06/27/20   Ursula Alert, MD  cetirizine (ZYRTEC) 5 MG tablet Take 1 tablet (5 mg total) by mouth daily. 02/19/21   Burnard Hawthorne, FNP  Cholecalciferol (VITAMIN D-3  PO) Take 1 capsule by mouth daily. 500iu    [provider]  clotrimazole-betamethasone (LOTRISONE) cream Apply 1 application topically daily. 02/19/21   Burnard Hawthorne, FNP  escitalopram (LEXAPRO) 10 MG tablet Take 1 tablet (10 mg total) by mouth daily. 06/27/20   Ursula Alert, MD  famotidine (PEPCID) 20 MG tablet Take 1 tablet (20 mg total) by mouth every evening. 02/19/21   Burnard Hawthorne, FNP  gentamicin  ointment (GARAMYCIN) 0.1 % Apply topically 3 (three) times daily. 05/05/20   [provider]  ipratropium (ATROVENT) 0.03 % nasal spray SMARTSIG:1-2 Spray(s) Both Nares 3 Times Daily PRN 05/06/20   [provider]  loperamide (IMODIUM) 2 MG capsule Take 2 mg by mouth 2 (two) times daily.    [provider]  memantine (NAMENDA) 5 MG tablet Take 1 tablet by mouth 2 (two) times daily. 03/19/21 03/19/22  [provider]  triamcinolone cream (KENALOG) 0.1 % Apply topically 2 (two) times daily. 08/30/15   Jackolyn Confer, MD  vitamin B-12 (CYANOCOBALAMIN) 1000 MCG tablet Take by mouth.    [provider]    Allergies Codeine, Darvon [propoxyphene hcl], and Propoxyphene  Family History  Problem Relation Age of Onset   Cancer Mother 63       ovarian    Pernicious anemia Mother    COPD Father        nonsmoker   Cancer Cousin        ovarian ca    Social History Social History   Tobacco Use   Smoking status: Never   Smokeless tobacco: Never  Vaping Use   Vaping Use: Never used  Substance Use Topics   Alcohol use: No   Drug use: No    Review of Systems  Constitutional: No fever/chills Eyes: No visual changes. ENT: No sore throat. Cardiovascular: Denies chest pain. Respiratory: Denies shortness of breath. Gastrointestinal: Positive for flank and abdominal pain.  No nausea, no vomiting.  No diarrhea.  No constipation. Genitourinary: Negative for dysuria. Musculoskeletal: Negative for back pain. Skin: Negative for rash. Neurological: Negative for headaches, focal weakness or numbness.   ____________________________________________   PHYSICAL EXAM:  VITAL SIGNS: ED Triage Vitals  Enc Vitals Group     BP 06/13/21 1547 (!) 75/59     Pulse Rate 06/13/21 1547 74     Resp 06/13/21 1547 18     Temp 06/13/21 1547 98.4 F (36.9 C)     Temp Source 06/13/21 1547 Oral     SpO2 06/13/21 1547 98 %     Weight 06/13/21 1546 185 lb (83.9 kg)      Height 06/13/21 1546 5\' 3"  (1.6 m)     Head Circumference --      Peak Flow --      Pain Score 06/13/21 1554 6     Pain Loc --      Pain Edu? --      Excl. in [place]? --     Constitutional: Alert and oriented.  Elderly appearing and in no acute distress. Eyes: Conjunctivae are normal. PERRL. EOMI. Head: Atraumatic. Nose: No congestion/rhinnorhea. Mouth/Throat: Mucous membranes are mildly dry. Neck: No stridor.   Cardiovascular: Normal rate, regular rhythm. Grossly normal heart sounds.  Good peripheral circulation. Respiratory: Normal respiratory effort.  No retractions. Lungs CTAB. Gastrointestinal: Soft and mildly tender to palpation right lateral abdomen without rebound or guard. No distention. No abdominal bruits. No CVA tenderness. Musculoskeletal: No lower extremity tenderness nor edema.  No joint  effusions. Neurologic:  Normal speech and language. No gross focal neurologic deficits are appreciated.  Skin:  Skin is warm, dry and intact. No rash noted.  No vesicles. Psychiatric: Mood and affect are normal. Speech and behavior are normal.  ____________________________________________   LABS (all labs ordered are listed, but only abnormal results are displayed)  Labs Reviewed  CBC - Abnormal; Notable for the following components:      Result Value   HCT 46.3 (*)    All other components within normal limits  BASIC METABOLIC PANEL - Abnormal; Notable for the following components:   Creatinine, Ser 1.16 (*)    Calcium 10.6 (*)    GFR, Estimated 46 (*)    All other components within normal limits  URINALYSIS, COMPLETE (UACMP) WITH MICROSCOPIC - Abnormal; Notable for the following components:   Color, Urine YELLOW (*)    APPearance CLEAR (*)    Hgb urine dipstick MODERATE (*)    Ketones, ur 5 (*)    All other components within normal limits  HEPATIC FUNCTION PANEL  LIPASE, BLOOD  TROPONIN I (HIGH SENSITIVITY)  TROPONIN I (HIGH SENSITIVITY)    ____________________________________________  EKG  ED ECG REPORT I, Dace Denn J, the attending physician, personally viewed and interpreted this ECG.   Date: 06/13/2021  EKG Time: 1551  Rate: 76  Rhythm: normal sinus rhythm  Axis: Normal  Intervals:none  ST&T Change: Nonspecific  ____________________________________________  RADIOLOGY I, Emelio Schneller J, personally viewed and evaluated these images (plain radiographs) as part of my medical decision making, as well as reviewing the written report by the radiologist.  ED MD interpretation: No acute abnormality to pelvis, lumbar spine or thoracic spine; CT right hip negative, CT renal colic study unremarkable except incidental findings of hepatic and right renal cysts  Official radiology report(s): DG Thoracic Spine 2 View  Result Date: 06/13/2021 CLINICAL DATA:  Low back pain and right flank pain EXAM: THORACIC SPINE 2 VIEWS COMPARISON:  09/30/2005 FINDINGS: Negative for fracture or mass lesion Normal alignment. Mild thoracic kyphosis has progressed. Multilevel disc degeneration and spurring throughout the thoracic spine similar to the prior study. IMPRESSION: Progressive thoracic kyphosis.  Negative for fracture. Electronically Signed   By: Franchot Gallo M.D.   On: 06/13/2021 17:06   DG Lumbar Spine 2-3 Views  Result Date: 06/13/2021 CLINICAL DATA:  Low back pain with right flank pain EXAM: LUMBAR SPINE - 2-3 VIEW COMPARISON:  09/30/2005 FINDINGS: Grade 1 anterolisthesis L4-5 which has progressed from the prior study. Otherwise normal alignment. Negative for fracture or mass. Multilevel mild disc degeneration in the lumbar spine. Calcified soft tissue lesions in the pelvis compatible with uterine fibroids. IMPRESSION: Multilevel disc degeneration in the lumbar spine. Grade 1 anterolisthesis L4-5. No acute abnormality. Electronically Signed   By: Franchot Gallo M.D.   On: 06/13/2021 17:08   DG Pelvis 1-2 Views  Result Date:  06/13/2021 CLINICAL DATA:  Right flank pain EXAM: PELVIS - 1-2 VIEW COMPARISON:  None. FINDINGS: Mild to moderate degenerative change right hip with joint space narrowing and spurring. Negative for fracture. Normal left hip No pelvic fracture or lesion. Calcified uterine fibroids in the pelvis. IMPRESSION: Mild to moderate degenerative change right hip. Electronically Signed   By: Franchot Gallo M.D.   On: 06/13/2021 17:09   CT Hip Right Wo Contrast  Result Date: 06/14/2021 CLINICAL DATA:  Hip pain, negative radiograph EXAM: CT OF THE RIGHT HIP WITHOUT CONTRAST TECHNIQUE: Multidetector CT imaging of the right hip was performed according to  the standard protocol. Multiplanar CT image reconstructions were also generated. COMPARISON:  Pelvic radiograph dated 06/13/2021 FINDINGS: No fracture or dislocation is seen. Mild degenerative changes of the right hip. Visualized bony pelvis appears intact. Visualized soft tissues are evaluated on the dedicated CT abdomen/pelvis. IMPRESSION: No fracture or dislocation is seen. Electronically Signed   By: Julian Hy M.D.   On: 06/14/2021 00:31   CT Renal Stone Study  Result Date: 06/14/2021 CLINICAL DATA:  Right flank pain, dementia EXAM: CT ABDOMEN AND PELVIS WITHOUT CONTRAST TECHNIQUE: Multidetector CT imaging of the abdomen and pelvis was performed following the standard protocol without IV contrast. COMPARISON:  08/30/2015 FINDINGS: Lower chest: Lung bases are clear Hepatobiliary: Subcentimeter cyst in the left hepatic dome. Liver is otherwise unremarkable. Gallbladder is unremarkable. No intrahepatic or extrahepatic ductal dilatation. Pancreas: Within normal limits. Spleen: Within normal limits. Adrenals/Urinary Tract: Adrenal glands are within normal limits. 4.9 cm medial left upper pole renal cysts (series 2/image 30), progressive from the prior, but benign. Additional 15 mm right lower pole renal cyst, unchanged, benign. No renal, ureteral, or bladder  calculi. No hydronephrosis. Bladder is within normal limits. Stomach/Bowel: Stomach is notable for a small hiatal hernia. No evidence of bowel obstruction. Normal appendix (series 2/image 58). Sigmoid diverticulosis, without evidence of diverticulitis. Vascular/Lymphatic: No evidence of abdominal aortic aneurysm. Atherosclerotic calcifications of the abdominal aorta and branch vessels. No suspicious abdominopelvic lymphadenopathy. Reproductive: Uterine fibroids. Bilateral ovaries are within normal limits. Other: No abdominopelvic ascites. Musculoskeletal: Degenerative changes of the visualized thoracolumbar spine. IMPRESSION: No renal, ureteral, or bladder calculi.  No hydronephrosis. No evidence of bowel obstruction. Normal appendix. Sigmoid diverticulosis, without evidence of diverticulitis. Additional ancillary findings as above. Electronically Signed   By: Julian Hy M.D.   On: 06/14/2021 00:30    ____________________________________________   PROCEDURES  Procedure(s) performed (including Critical Care):  Procedures   ____________________________________________   INITIAL IMPRESSION / ASSESSMENT AND PLAN / ED COURSE  As part of my medical decision making, I reviewed the following data within the [place] History obtained from family, Nursing notes reviewed and incorporated, Labs reviewed, EKG interpreted, Old chart reviewed, Radiograph reviewed, and Notes from prior ED visits     87 year old female presenting with a several month history of right flank/abdominal pain. Differential diagnosis includes, but is not limited to, ovarian cyst, ovarian torsion, acute appendicitis, diverticulitis, urinary tract infection/pyelonephritis, endometriosis, bowel obstruction, colitis, renal colic, gastroenteritis, hernia, fibroids, endometriosis, etc.   Laboratory results remarkable for mild AKI.  Plain film x-rays unremarkable other than degenerative changes.  Will obtain CT  renal colic study, CT right hip.  Initiate IV fluid resuscitation.  Will reassess.  ----------------------------------------- 12:43 AM on 06/14/2021 -----------------------------------------   Updated patient and her daughter on all test results.  Patient missed her BuSpar dose tonight, will administer.  At this time will administer low-dose IV Toradol, Norco and perform ambulation trial.  Daughter states they have helpers at home and would prefer to take patient home but recognizes she would need to have a safe discharge first.  ----------------------------------------- 2:14 AM on 06/14/2021 -----------------------------------------   Patient feeling better.  Witnessed to ambulate well with walker without assistance.  Daughter still desires to take patient home.  Will discharge home with as needed prescription for Norco and orthopedics follow-up.  Strict return precautions given.  Daughter verbalizes understanding and agrees with plan of care.      ____________________________________________   FINAL CLINICAL IMPRESSION(S) / ED DIAGNOSES  Final diagnoses:  Hip  pain  Right flank pain  Right lower quadrant abdominal pain     ED Discharge Orders          Ordered    HYDROcodone-acetaminophen (NORCO) 5-325 MG tablet  Every 6 hours PRN        06/14/21 0216             Note:  This document was prepared using Dragon voice recognition software and may include unintentional dictation errors.    Paulette Blanch, MD 06/14/21 [phone]

## 2021-06-13 NOTE — ED Triage Notes (Signed)
Pt to ED via POV with c/o right flank pain longer than 2 months. It hurts when she moves positions, Denies any blood or strong smell of urine. She has dementia. Denies any falls or injuries.

## 2021-06-13 NOTE — Telephone Encounter (Signed)
Patient's daughter called stating her mother was experiencing severe right side pain. Transferred call to Access Nurse.

## 2021-06-13 NOTE — ED Provider Notes (Signed)
Emergency Medicine Provider Triage Evaluation Note  [name] , a 87 y.o. female  was evaluated in triage.Presents to the emergency department for evaluation of right flank pain.  She points to the right superior iliac crest.  She states pain is been present for several months but she has not told anyone about the pain.  No falls trauma or injury.  She lives at home with daughter who is the caretaker.  Daughter states she has been getting around fairly well but slightly guarding her right side.  She notes pain with leaning forward to stand up.  No numbness tingling radicular symptoms.  She denies any chest pain or shortness of breath.  No urinary symptoms..  Review of Systems  Positive: Right flank pain, superior iliac crest pain.   Negative: Chest pain, shortness of breath, fevers or urinary symptoms.  Physical Exam  BP 120/66 (BP Location: Left Arm)   Pulse 74   Temp 98.4 F (36.9 C) (Oral)   Resp 18   Ht 5\' 3"  (1.6 m)   Wt 83.9 kg   SpO2 98%   BMI 32.77 kg/m  Gen:   Awake, no distress presents in a wheelchair Resp:  Normal effort no respiratory distress MSK:   Moves extremities without difficulty.  Good range of motion of the right hip with no discomfort in the groin.  No signs of pain with right hip internal and external rotation.  Point tender along the right superior iliac crest with no lumbar spinous process tenderness Other:    Medical Decision Making  Medically screening exam initiated at 3:58 PM.  Appropriate orders placed.  JANIQUA FRISCIA was informed that the remainder of the evaluation will be completed by another provider, this initial triage assessment does not replace that evaluation, and the importance of remaining in the ED until their evaluation is complete.  87 year old female with right superior iliac crest pain, point tenderness.  Possible rib hip impingement but will order basic blood work, troponin, urinalysis, x-rays of the lumbar and thoracic spine to  evaluate for compression fracture.  No signs of hip fracture on exam   Renata Caprice 06/13/21 1602    Delman Kitten, MD 06/14/21 [phone]

## 2021-06-13 NOTE — Telephone Encounter (Signed)
noted 

## 2021-06-14 ENCOUNTER — Emergency Department: Payer: Medicare Other

## 2021-06-14 DIAGNOSIS — F0390 Unspecified dementia without behavioral disturbance: Secondary | ICD-10-CM | POA: Diagnosis not present

## 2021-06-14 DIAGNOSIS — M1611 Unilateral primary osteoarthritis, right hip: Secondary | ICD-10-CM | POA: Diagnosis not present

## 2021-06-14 DIAGNOSIS — D259 Leiomyoma of uterus, unspecified: Secondary | ICD-10-CM | POA: Diagnosis not present

## 2021-06-14 DIAGNOSIS — K5730 Diverticulosis of large intestine without perforation or abscess without bleeding: Secondary | ICD-10-CM | POA: Diagnosis not present

## 2021-06-14 LAB — HEPATIC FUNCTION PANEL
ALT: 11 U/L (ref 0–44)
AST: 21 U/L (ref 15–41)
Albumin: 4.1 g/dL (ref 3.5–5.0)
Alkaline Phosphatase: 63 U/L (ref 38–126)
Bilirubin, Direct: <0.1 mg/dL (ref 0.0–0.2)
Total Bilirubin: 0.7 mg/dL (ref 0.3–1.2)
Total Protein: 7.6 g/dL (ref 6.5–8.1)

## 2021-06-14 LAB — URINALYSIS, COMPLETE (UACMP) WITH MICROSCOPIC
Bacteria, UA: NONE SEEN
Bilirubin Urine: NEGATIVE
Glucose, UA: NEGATIVE mg/dL
Ketones, ur: 5 mg/dL — AB
Leukocytes,Ua: NEGATIVE
Nitrite: NEGATIVE
Protein, ur: NEGATIVE mg/dL
Specific Gravity, Urine: 1.013 (ref 1.005–1.030)
pH: 7 (ref 5.0–8.0)

## 2021-06-14 LAB — LIPASE, BLOOD: Lipase: 43 U/L (ref 11–51)

## 2021-06-14 MED ORDER — ACETAMINOPHEN 325 MG PO TABS: 650.0000 mg | ORAL_TABLET | Freq: Once | ORAL | Status: DC

## 2021-06-14 MED ORDER — KETOROLAC TROMETHAMINE 30 MG/ML IJ SOLN
10.0000 mg | Freq: Once | INTRAMUSCULAR | Status: AC
  Administered 2021-06-14: 9.9 mg via INTRAVENOUS
  Filled 2021-06-14: qty 1

## 2021-06-14 MED ORDER — HYDROCODONE-ACETAMINOPHEN 5-325 MG PO TABS: 1.0000 | ORAL_TABLET | Freq: Four times a day (QID) | ORAL | 0 refills | Status: DC | PRN

## 2021-06-14 MED ORDER — HYDROCODONE-ACETAMINOPHEN 5-325 MG PO TABS
1.0000 | ORAL_TABLET | Freq: Once | ORAL | Status: AC
  Administered 2021-06-14: 1 via ORAL
  Filled 2021-06-14: qty 1

## 2021-06-14 MED ORDER — BUSPIRONE HCL 5 MG PO TABS
10.0000 mg | ORAL_TABLET | Freq: Once | ORAL | Status: AC
  Administered 2021-06-14: 10 mg via ORAL
  Filled 2021-06-14: qty 2

## 2021-06-14 NOTE — Discharge Instructions (Signed)
You may take Norco as needed for pain.  Return to the ER for worsening symptoms, persistent vomiting, difficulty breathing or other concerns.

## 2021-06-15 NOTE — Telephone Encounter (Signed)
Seen in ED 06/13/21

## 2021-06-18 DIAGNOSIS — M47896 Other spondylosis, lumbar region: Secondary | ICD-10-CM | POA: Diagnosis not present

## 2021-06-18 DIAGNOSIS — M4316 Spondylolisthesis, lumbar region: Secondary | ICD-10-CM | POA: Diagnosis not present

## 2021-06-18 DIAGNOSIS — M545 Low back pain, unspecified: Secondary | ICD-10-CM | POA: Insufficient documentation

## 2021-06-18 DIAGNOSIS — M1611 Unilateral primary osteoarthritis, right hip: Secondary | ICD-10-CM | POA: Insufficient documentation

## 2021-06-18 DIAGNOSIS — M13851 Other specified arthritis, right hip: Secondary | ICD-10-CM | POA: Diagnosis not present

## 2021-06-18 NOTE — Telephone Encounter (Signed)
Pts daughter, [name], called in regards to pt. Pt was seen in ED 11/2-11/3 for the pain in her left side. Pt was sent to [facility] 11/7 and they have diagnosed her pain with arthritis. Daughter states pt's kidneys are in a later stage so she was wanting to know if pt can be prescribed something to reduce the inflammation due to arthritis and does she need to be seen in office? The pt is currently using Hydrocodone/ acetaminophen for pain that she was prescribed.    [phone]

## 2021-06-19 NOTE — Telephone Encounter (Signed)
I have patient scheduled 11/29 at 11:30.

## 2021-06-19 NOTE — Telephone Encounter (Signed)
Call pt She will need appt to discuss pain and medication Please sch Prefer not to use HFU slot since she was technically ED

## 2021-06-22 ENCOUNTER — Encounter: Payer: Self-pay | Admitting: Psychiatry

## 2021-06-22 ENCOUNTER — Telehealth (INDEPENDENT_AMBULATORY_CARE_PROVIDER_SITE_OTHER): Payer: Medicare Other | Admitting: Psychiatry

## 2021-06-22 ENCOUNTER — Other Ambulatory Visit: Payer: Self-pay

## 2021-06-22 DIAGNOSIS — F418 Other specified anxiety disorders: Secondary | ICD-10-CM | POA: Diagnosis not present

## 2021-06-22 DIAGNOSIS — G3183 Dementia with Lewy bodies: Secondary | ICD-10-CM

## 2021-06-22 DIAGNOSIS — F02818 Dementia in other diseases classified elsewhere, unspecified severity, with other behavioral disturbance: Secondary | ICD-10-CM

## 2021-06-22 DIAGNOSIS — F3342 Major depressive disorder, recurrent, in full remission: Secondary | ICD-10-CM

## 2021-06-22 MED ORDER — ESCITALOPRAM OXALATE 10 MG PO TABS: 10.0000 mg | ORAL_TABLET | Freq: Every day | ORAL | 3 refills | Status: DC

## 2021-06-22 MED ORDER — BUSPIRONE HCL 10 MG PO TABS: 20.0000 mg | ORAL_TABLET | Freq: Every day | ORAL | 0 refills | Status: DC

## 2021-06-22 NOTE — Progress Notes (Signed)
Virtual Visit via Video Note  I connected with [name] on 06/22/21 at 11:00 AM EST by a video enabled telemedicine application and verified that I am speaking with the correct person using two identifiers.  Location Provider Location : [facility] Patient Location : Home  Participants: Patient ,Daughter, Provider    I discussed the limitations of evaluation and management by telemedicine and the availability of in person appointments. The patient expressed understanding and agreed to proceed.    I discussed the assessment and treatment plan with the patient. The patient was provided an opportunity to ask questions and all were answered. The patient agreed with the plan and demonstrated an understanding of the instructions.   The patient was advised to call back or seek an in-person evaluation if the symptoms worsen or if the condition fails to improve as anticipated.   [place] MD OP Progress Note  06/22/2021 12:10 PM [name]  MRN:  [id number]  Chief Complaint:  Chief Complaint   Follow-up; Anxiety; Depression    HPI: [name] is a 87 year old Caucasian female, has a history of MDD, dementia, gastroesophageal reflux disease, obstructive sleep apnea compliant on CPAP, osteoporosis, chronic kidney disease stage III was evaluated by telemedicine today.  Collateral information was obtained from daughter-Jean  Patient being a limited historian majority of information was obtained from daughter today.  Patient today appeared to have limited eye contact, appeared to be anxious although she did smile at least once during our conversation.  As per daughter patient recently had an emergency department visit on 06/13/2021-for right flank pain.  Per review of emergency department notes patient was diagnosed with right hip impingement and was reported to [facility].  As per daughter patient was diagnosed with arthritis and is currently taking hydrocodone-acetaminophen as needed.  She is  scheduled to follow up with her primary care provider for further management.  She has been taking the medication for pain sparingly.  Patient does appear to be anxious the past few days likely due to pain as well as the inclement weather.  She is compliant on medications.  Agreeable to increasing the dosage of the BuSpar slightly.  Patient continues to have dementia related symptoms, currently on memantine, has good support system at home.  Patient did not express any suicidality, homicidality or perceptual disturbances.  Patient appeared to be alert, oriented to person self situation as well as the month.  Visit Diagnosis:    ICD-10-CM   1. MDD (major depressive disorder), recurrent, in full remission ([place])  F33.42 busPIRone (BUSPAR) 10 MG tablet    escitalopram (LEXAPRO) 10 MG tablet    2. Lewy body dementia with behavioral disturbance (H[facility])  G31.83    F02.818     3. Other specified anxiety disorders  F41.8 busPIRone (BUSPAR) 10 MG tablet      Past Psychiatric History: Reviewed past psychiatric history from progress note on 01/04/2020.  Past Medical History:  Past Medical History:  Diagnosis Date   Anxiety    Depression    GERD (gastroesophageal reflux disease)    Hypercalcemia    Pneumonia    Sepsis ([place])    Sleep apnea    SOB (shortness of breath) on exertion     Past Surgical History:  Procedure Laterality Date   ENDOMETRIAL ABLATION     EYE SURGERY  2011   left cataract   KNEE ARTHROSCOPY Left 05/03/2015   Procedure: left knee arthroscopy, partial medial menisectomy, chondroplasty;  Surgeon: Dereck Leep,  MD;  Location: [facility] ORS;  Service: Orthopedics;  Laterality: Left;   TONSILLECTOMY      Family Psychiatric History: Reviewed family psychiatric history from progress note on 01/04/2020  Family History:  Family History  Problem Relation Age of Onset   Cancer Mother 62       ovarian    Pernicious anemia Mother    COPD Father        nonsmoker   Cancer  Cousin        ovarian ca    Social History: Reviewed social history from progress note on 01/04/2020 Social History   Socioeconomic History   Marital status: Widowed    Spouse name: Not on file   Number of children: 5   Years of education: Not on file   Highest education level: Not on file  Occupational History   Occupation: retired  Tobacco Use   Smoking status: Never   Smokeless tobacco: Never  Vaping Use   Vaping Use: Never used  Substance and Sexual Activity   Alcohol use: No   Drug use: No   Sexual activity: Not Currently  Other Topics Concern   Not on file  Social History Narrative   Regular exercise: not really   Caffeine use: some   Daughter lives with her.    Social Determinants of Health   Financial Resource Strain: Low Risk    Difficulty of Paying Living Expenses: Not hard at all  Food Insecurity: No Food Insecurity   Worried About Charity fundraiser in the Last Year: Never true   [place] in the Last Year: Never true  Transportation Needs: No Transportation Needs   Lack of Transportation (Medical): No   Lack of Transportation (Non-Medical): No  Physical Activity: Unknown   Days of Exercise per Week: 0 days   Minutes of Exercise per Session: Not on file  Stress: No Stress Concern Present   Feeling of Stress : Not at all  Social Connections: Unknown   Frequency of Communication with Friends and Family: Not on file   Frequency of Social Gatherings with Friends and Family: More than three times a week   Attends Religious Services: Not on file   Active Member of Clubs or Organizations: Not on file   Attends Archivist Meetings: Not on file   Marital Status: Widowed    Allergies:  Allergies  Allergen Reactions   Codeine Other (See Comments)    Crying spells   Darvon [Propoxyphene Hcl] Other (See Comments)    Crying spells   Propoxyphene Other (See Comments)    Crying, altered mental status    Metabolic Disorder Labs: Lab  Results  Component Value Date   HGBA1C 6.0 08/21/2017   Lab Results  Component Value Date   PROLACTIN 11.9 05/12/2020   Lab Results  Component Value Date   CHOL 166 02/19/2021   TRIG 139.0 02/19/2021   HDL 44.40 02/19/2021   CHOLHDL 4 02/19/2021   VLDL 27.8 02/19/2021   LDLCALC 93 02/19/2021   LDLCALC 120 (H) 05/12/2020   Lab Results  Component Value Date   TSH 3.68 05/12/2020   TSH 3.73 08/21/2017    Therapeutic Level Labs: No results found for: LITHIUM No results found for: VALPROATE No components found for:  CBMZ  Current Medications: Current Outpatient Medications  Medication Sig Dispense Refill   busPIRone (BUSPAR) 10 MG tablet Take 2 tablets (20 mg total) by mouth at bedtime. 180 tablet 0   cetirizine (ZYRTEC)  5 MG tablet Take 1 tablet (5 mg total) by mouth daily. 90 tablet 1   Cholecalciferol (VITAMIN D-3 PO) Take 1 capsule by mouth daily. 500iu     clotrimazole-betamethasone (LOTRISONE) cream Apply 1 application topically daily. 30 g 3   escitalopram (LEXAPRO) 10 MG tablet Take 1 tablet (10 mg total) by mouth daily. 90 tablet 3   famotidine (PEPCID) 20 MG tablet Take 1 tablet (20 mg total) by mouth every evening. 90 tablet 0   gentamicin ointment (GARAMYCIN) 0.1 % Apply topically 3 (three) times daily.     HYDROcodone-acetaminophen (NORCO) 5-325 MG tablet Take 1 tablet by mouth every 6 (six) hours as needed for moderate pain. 15 tablet 0   ipratropium (ATROVENT) 0.03 % nasal spray SMARTSIG:1-2 Spray(s) Both Nares 3 Times Daily PRN     loperamide (IMODIUM) 2 MG capsule Take 2 mg by mouth 2 (two) times daily.     memantine (NAMENDA) 5 MG tablet Take 1 tablet by mouth 2 (two) times daily.     triamcinolone cream (KENALOG) 0.1 % Apply topically 2 (two) times daily. 454 g 1   vitamin B-12 (CYANOCOBALAMIN) 1000 MCG tablet Take by mouth.     No current facility-administered medications for this visit.     Musculoskeletal: Strength & Muscle Tone:  UTA Gait &  Station:  Seated Patient leans: N/A  Psychiatric Specialty Exam: Review of Systems  Unable to perform ROS: Dementia   There were no vitals taken for this visit.There is no height or weight on file to calculate BMI.  General Appearance: Casual  Eye Contact:  Poor  Speech:  Normal Rate  Volume:  Decreased  Mood:  Anxious  Affect:  Flat, smiled once when prompted  Thought Process:  Linear and Descriptions of Associations: Intact  Orientation:  Other:  self, situation, month  Thought Content: Logical   Suicidal Thoughts:  No  Homicidal Thoughts:  No  Memory:  Immediate;   Fair Recent;   limited Remote;   Poor  Judgement:  Fair  Insight:  Fair  Psychomotor Activity:  Decreased  Concentration:  Concentration: Fair and Attention Span: Fair  Recall:  Poor  Fund of Knowledge: Poor  Language: Fair  Akathisia:  No  Handed:  Right  AIMS (if indicated): not done  Assets:  Desire for Improvement Housing Social Support Transportation  ADL's:  Intact  Cognition: Impaired,  Mild  Sleep:  Fair   Screenings: [place] Office Visit from 06/16/2017 in [place] Office Visit from 02/07/2016 in [place] Total Score 0 [place] Visit from 10/09/2015 in [place]  Total Score (max 30 points ) 27      PHQ2-9    [place] Visit from 04/02/2021 in [place] Video Visit from 10/26/2020 in [place] from 06/28/2020 in [place] Office Visit from 05/12/2020 in [place] Office Visit from 09/18/2017 in [place]  PHQ-2 Total Score 0 1 0 0 0      [place] ED from 06/13/2021 in [place] Video Visit from 03/23/2021 in [place] Video  Visit from 10/26/2020 in [place] No Risk Error: Question 1 not populated No Risk        Assessment and Plan:  JESSECA MARSCH is a 87 year old Caucasian female, lives in [place], has a history of MDD, dementia, anxiety disorder was evaluated by telemedicine today.  Patient currently with anxiety symptoms and will benefit from medication readjustment.  Patient also with recent right-sided flank pain, likely due to arthritis will need sufficient pain management.  Plan as noted below.  Plan MDD in full remission Lexapro 10 mg p.o. daily  Lewy body dementia-chronic Patient is currently on memantine.  We will need continued support and supervision.  Other specified anxiety-limited symptom attacks-unstable Increase BuSpar to 20 mg p.o. nightly. Discussed" for CBT as needed.  Reviewed notes per emergency department visit-06/13/2021-patient will benefit from sufficient pain management.  Collateral information was obtained from daughter as noted above.  This note was generated in part or whole with voice recognition software. Voice recognition is usually quite accurate but there are transcription errors that can and very often do occur. I apologize for any typographical errors that were not detected and corrected.     Ursula Alert, MD 06/22/2021, 12:10 PM

## 2021-06-29 ENCOUNTER — Ambulatory Visit (INDEPENDENT_AMBULATORY_CARE_PROVIDER_SITE_OTHER): Payer: Medicare Other

## 2021-06-29 VITALS — Ht 63.0 in | Wt 185.0 lb

## 2021-06-29 DIAGNOSIS — Z0000 Encounter for general adult medical examination without abnormal findings: Secondary | ICD-10-CM

## 2021-06-29 NOTE — Progress Notes (Signed)
Subjective:   [name] is a 87 y.o. female who presents for Medicare Annual (Subsequent) preventive examination.  Review of Systems    No ROS.  Medicare Wellness Virtual Visit.  Visual/audio telehealth visit, UTA vital signs.   See social history for additional risk factors.   Cardiac Risk Factors include: advanced age (>26men, >36 women)     Objective:    Today's Vitals   06/29/21 1500  Weight: 185 lb (83.9 kg)  Height: 5\' 3"  (1.6 m)   Body mass index is 32.77 kg/m.  Advanced Directives 06/29/2021 06/28/2020 06/28/2019 06/26/2018 04/21/2015  Does Patient Have a Medical Advance Directive? Yes Yes Yes Yes Yes  Type of Paramedic of [place];Living will [place];Living will [place];Living will [place];Living will [place];Living will  Does patient want to make changes to medical advance directive? No - Patient declined No - Patient declined No - Patient declined No - Patient declined -  Copy of [place] in Chart? Yes - validated most recent copy scanned in chart (See row information) Yes - validated most recent copy scanned in chart (See row information) Yes - validated most recent copy scanned in chart (See row information) Yes - validated most recent copy scanned in chart (See row information) -  Some encounter information is confidential and restricted. Go to Review Flowsheets activity to see all data.    Current Medications (verified) Outpatient Encounter Medications as of 06/29/2021  Medication Sig   busPIRone (BUSPAR) 10 MG tablet Take 2 tablets (20 mg total) by mouth at bedtime.   cetirizine (ZYRTEC) 5 MG tablet Take 1 tablet (5 mg total) by mouth daily.   Cholecalciferol (VITAMIN D-3 PO) Take 1 capsule by mouth daily. 500iu   clotrimazole-betamethasone (LOTRISONE) cream Apply 1 application topically daily.   escitalopram (LEXAPRO) 10 MG tablet  Take 1 tablet (10 mg total) by mouth daily.   famotidine (PEPCID) 20 MG tablet Take 1 tablet (20 mg total) by mouth every evening.   gentamicin ointment (GARAMYCIN) 0.1 % Apply topically 3 (three) times daily.   HYDROcodone-acetaminophen (NORCO) 5-325 MG tablet Take 1 tablet by mouth every 6 (six) hours as needed for moderate pain.   ipratropium (ATROVENT) 0.03 % nasal spray SMARTSIG:1-2 Spray(s) Both Nares 3 Times Daily PRN   loperamide (IMODIUM) 2 MG capsule Take 2 mg by mouth 2 (two) times daily.   memantine (NAMENDA) 5 MG tablet Take 1 tablet by mouth 2 (two) times daily.   triamcinolone cream (KENALOG) 0.1 % Apply topically 2 (two) times daily.   vitamin B-12 (CYANOCOBALAMIN) 1000 MCG tablet Take by mouth.   No facility-administered encounter medications on file as of 06/29/2021.    Allergies (verified) Codeine, Darvon [propoxyphene hcl], and Propoxyphene   History: Past Medical History:  Diagnosis Date   Anxiety    Depression    GERD (gastroesophageal reflux disease)    Hypercalcemia    Pneumonia    Sepsis ([place])    Sleep apnea    SOB (shortness of breath) on exertion    Past Surgical History:  Procedure Laterality Date   ENDOMETRIAL ABLATION     EYE SURGERY  2011   left cataract   KNEE ARTHROSCOPY Left 05/03/2015   Procedure: left knee arthroscopy, partial medial menisectomy, chondroplasty;  Surgeon: Dereck Leep, MD;  Location: [facility] ORS;  Service: Orthopedics;  Laterality: Left;   TONSILLECTOMY     Family History  Problem Relation Age of Onset   Cancer Mother 35       ovarian    Pernicious anemia Mother    COPD Father        nonsmoker   Cancer Cousin        ovarian ca   Social History   Socioeconomic History   Marital status: Widowed    Spouse name: Not on file   Number of children: 5   Years of education: Not on file   Highest education level: Not on file  Occupational History   Occupation: retired  Tobacco Use   Smoking status: Never   Smokeless  tobacco: Never  Vaping Use   Vaping Use: Never used  Substance and Sexual Activity   Alcohol use: No   Drug use: No   Sexual activity: Not Currently  Other Topics Concern   Not on file  Social History Narrative   Regular exercise: not really   Caffeine use: some   Daughter lives with her.    Social Determinants of Health   Financial Resource Strain: Low Risk    Difficulty of Paying Living Expenses: Not hard at all  Food Insecurity: No Food Insecurity   Worried About Charity fundraiser in the Last Year: Never true   [place] in the Last Year: Never true  Transportation Needs: No Transportation Needs   Lack of Transportation (Medical): No   Lack of Transportation (Non-Medical): No  Physical Activity: Not on file  Stress: No Stress Concern Present   Feeling of Stress : Not at all  Social Connections: Moderately Isolated   Frequency of Communication with Friends and Family: Once a week   Frequency of Social Gatherings with Friends and Family: More than three times a week   Attends Religious Services: More than 4 times per year   Active Member of [employer] or Organizations: No   Attends Archivist Meetings: Never   Marital Status: Widowed    Tobacco Counseling Counseling given: Not Answered   Clinical Intake:  Pre-visit preparation completed: Yes        Diabetes: No  How often do you need to have someone help you when you read instructions, pamphlets, or other written materials from your doctor or pharmacy?: 1 - Never    Interpreter Needed?: No      Activities of Daily Living In your present state of health, do you have any difficulty performing the following activities: 06/29/2021  Hearing? Y  Comment Hearing aids  Vision? N  Difficulty concentrating or making decisions? Y  Comment Dementia  Walking or climbing stairs? Y  Comment Walks with rollator  Dressing or bathing? N  Doing errands, shopping? Y  Preparing Food and eating ? Y   Using the Toilet? N  In the past six months, have you accidently leaked urine? Y  Comment Manahed with daily brief  Do you have problems with loss of bowel control? Y  Comment Managed with daily brief  Managing your Medications? Y  Comment Daughter manages  Managing your Finances? Y  Comment Daughter manages  Housekeeping or managing your Housekeeping? Y  Comment Daughter manages  Some recent data might be hidden    Patient Care Team: Burnard Hawthorne, FNP as PCP - General (Family Medicine)  Indicate any recent Medical Services you may have received from other than [facility] providers in the past year (date may be approximate).     Assessment:   This is a routine wellness  examination for Francile.  Virtual Visit via Telephone Note  I connected with  TAWNYA PUJOL on 06/29/21 at  2:45 PM EST by telephone and verified that I am speaking with the correct person using two identifiers.  Location: Patient: home Provider: office Persons participating in the virtual visit: patient/daughter/Nurse Health Advisor   I discussed the limitations, risks, security and privacy concerns of performing an evaluation and management service by telephone and the availability of in person appointments. The patient expressed understanding and agreed to proceed.  Interactive audio and video telecommunications were attempted between this nurse and patient, however failed, due to patient having technical difficulties OR patient did not have access to video capability.  We continued and completed visit with audio only.  Some vital signs may be absent or patient reported.   Hearing/Vision screen Hearing Screening - Comments:: Hearing aids Vision Screening - Comments:: Followed by Dr. Ellin Mayhew  Wears reading glasses  Annual visits   Dietary issues and exercise activities discussed: Current Exercise Habits: Home exercise routine, Type of exercise: walking, Intensity: Mild Regular diet Fair water intake   Goals Addressed             This Visit's Progress    Healthy Lifestyle       Stay active as possible Eat healthy Stay hydrated       Depression Screen PHQ 2/9 Scores 06/29/2021 04/02/2021 02/19/2021 11/13/2020 08/14/2020 06/28/2020 05/12/2020  PHQ - 2 Score 0 0 - - - 0 0  Exception Documentation Other- indicate reason in comment box - Medical reason Medical reason Medical reason - Medical reason  Not completed Followed by Psychiatry every 3 months - - - - - -  Some encounter information is confidential and restricted. Go to Review Flowsheets activity to see all data.    Fall Risk Fall Risk  06/29/2021 04/02/2021 11/13/2020 06/28/2020 05/12/2020  Falls in the past year? 0 0 0 0 0  Number falls in past yr: 0 0 - 0 0  Injury with Fall? - 0 - 0 0  Risk for fall due to : - - - - -  Follow up Falls evaluation completed Falls evaluation completed Falls evaluation completed Falls evaluation completed Falls evaluation completed    [place]: Home free of loose throw rugs in walkways, pet beds, electrical cords, etc? Yes  Adequate lighting in your home to reduce risk of falls? Yes   ASSISTIVE DEVICES UTILIZED TO PREVENT FALLS: Life alert? No  Use of a cane, walker or w/c? Yes  Grab bars in the bathroom? Yes  Shower chair or bench in shower? Yes  Elevated toilet seat or a handicapped toilet? No   TIMED UP AND GO: Was the test performed? No .   Cognitive Function: Patient is alert.  Followed by Neurology.  MMSE - Mini Mental State Exam 06/28/2020 10/09/2015  Not completed: Unable to complete -  Registration-comments - 3  Some encounter information is confidential and restricted. Go to Review Flowsheets activity to see all data.     6CIT Screen 06/26/2018  What Year? 0 points  What month? 0 points  What time? 0 points  Count back from 20 0 points  Months in reverse 0 points  Repeat phrase 0 points  Total Score 0    Immunizations Immunization  History  Administered Date(s) Administered   Fluad Quad(high Dose 65+) 05/12/2020   Influenza Split 06/24/2014, 06/12/2015   Influenza, High Dose Seasonal PF 05/30/2016, 05/09/2017, 06/12/2018, 05/26/2021  Influenza,inj,Quad PF,6+ Mos 05/07/2013   Influenza-Unspecified 05/09/2017   PFIZER(Purple Top)SARS-COV-2 Vaccination 09/09/2019, 09/26/2019, 07/24/2020   Pneumococcal Conjugate-13 06/26/2015   Pneumococcal Polysaccharide-23 12/28/2010, 11/02/2012   Shingrix Completed?: No.    Education has been provided regarding the importance of this vaccine. Patient has been advised to call insurance company to determine out of pocket expense if they have not yet received this vaccine. Advised may also receive vaccine at local pharmacy or Health Dept. Verbalized acceptance and understanding.  Screening Tests Health Maintenance  Topic Date Due   COVID-19 Vaccine (4 - Booster for Pfizer series) 07/15/2021 (Originally 09/18/2020)   Zoster Vaccines- Shingrix (1 of 2) 09/29/2021 (Originally 03/14/1951)   TETANUS/TDAP  06/29/2022 (Originally 03/14/1951)   Pneumonia Vaccine 48+ Years old  Completed   INFLUENZA VACCINE  Completed   DEXA SCAN  Completed   HPV VACCINES  Aged Out   Health Maintenance There are no preventive care reminders to display for this patient.  Lung Cancer Screening: (Low Dose CT Chest recommended if Age 4-80 years, 30 pack-year currently smoking OR have quit w/in 15years.) does not qualify.   Vision Screening: Recommended annual ophthalmology exams for early detection of glaucoma and other disorders of the eye.  Dental Screening: Recommended annual dental exams for proper oral hygiene  Community Resource Referral / Chronic Care Management: CRR required this visit?  No   CCM required this visit?  No      Plan:   Keep all routine maintenance appointments.   I have personally reviewed and noted the following in the patient's chart:   Medical and social history Use of  alcohol, tobacco or illicit drugs  Current medications and supplements including opioid prescriptions. Not taking opioid.  Functional ability and status Nutritional status Physical activity Advanced directives List of other physicians Hospitalizations, surgeries, and ER visits in previous 12 months Vitals Screenings to include cognitive, depression, and falls Referrals and appointments  In addition, I have reviewed and discussed with patient certain preventive protocols, quality metrics, and best practice recommendations. A written personalized care plan for preventive services as well as general preventive health recommendations were provided to patient.     Varney Biles, LPN   08/04/8249

## 2021-06-29 NOTE — Patient Instructions (Addendum)
[name] , Thank you for taking time to come for your Medicare Wellness Visit. I appreciate your ongoing commitment to your health goals. Please review the following plan we discussed and let me know if I can assist you in the future.   These are the goals we discussed:  Goals      Healthy Lifestyle     Stay active as possible Eat healthy Stay hydrated        This is a list of the screening recommended for you and due dates:  Health Maintenance  Topic Date Due   COVID-19 Vaccine (4 - Booster for Pfizer series) 07/15/2021*   Zoster (Shingles) Vaccine (1 of 2) 09/29/2021*   Tetanus Vaccine  06/29/2022*   Pneumonia Vaccine  Completed   Flu Shot  Completed   DEXA scan (bone density measurement)  Completed   HPV Vaccine  Aged Out  *Topic was postponed. The date shown is not the original due date.    Advanced directives: on file  Conditions/risks identified: none new  Follow up in one year for your annual wellness visit    Preventive Care 65 Years and Older, Female Preventive care refers to lifestyle choices and visits with your health care provider that can promote health and wellness. What does preventive care include? A yearly physical exam. This is also called an annual well check. Dental exams once or twice a year. Routine eye exams. Ask your health care provider how often you should have your eyes checked. Personal lifestyle choices, including: Daily care of your teeth and gums. Regular physical activity. Eating a healthy diet. Avoiding tobacco and drug use. Limiting alcohol use. Practicing safe sex. Taking low-dose aspirin every day. Taking vitamin and mineral supplements as recommended by your health care provider. What happens during an annual well check? The services and screenings done by your health care provider during your annual well check will depend on your age, overall health, lifestyle risk factors, and family history of disease. Counseling  Your  health care provider may ask you questions about your: Alcohol use. Tobacco use. Drug use. Emotional well-being. Home and relationship well-being. Sexual activity. Eating habits. History of falls. Memory and ability to understand (cognition). Work and work Statistician. Reproductive health. Screening  You may have the following tests or measurements: Height, weight, and BMI. Blood pressure. Lipid and cholesterol levels. These may be checked every 5 years, or more frequently if you are over 76 years old. Skin check. Lung cancer screening. You may have this screening every year starting at age 103 if you have a 30-pack-year history of smoking and currently smoke or have quit within the past 15 years. Fecal occult blood test (FOBT) of the stool. You may have this test every year starting at age 80. Flexible sigmoidoscopy or colonoscopy. You may have a sigmoidoscopy every 5 years or a colonoscopy every 10 years starting at age 43. Hepatitis C blood test. Hepatitis B blood test. Sexually transmitted disease (STD) testing. Diabetes screening. This is done by checking your blood sugar (glucose) after you have not eaten for a while (fasting). You may have this done every 1-3 years. Bone density scan. This is done to screen for osteoporosis. You may have this done starting at age 35. Mammogram. This may be done every 1-2 years. Talk to your health care provider about how often you should have regular mammograms. Talk with your health care provider about your test results, treatment options, and if necessary, the need for more tests.  Vaccines  Your health care provider may recommend certain vaccines, such as: Influenza vaccine. This is recommended every year. Tetanus, diphtheria, and acellular pertussis (Tdap, Td) vaccine. You may need a Td booster every 10 years. Zoster vaccine. You may need this after age 45. Pneumococcal 13-valent conjugate (PCV13) vaccine. One dose is recommended after age  45. Pneumococcal polysaccharide (PPSV23) vaccine. One dose is recommended after age 89. Talk to your health care provider about which screenings and vaccines you need and how often you need them. This information is not intended to replace advice given to you by your health care provider. Make sure you discuss any questions you have with your health care provider. Document Released: 08/25/2015 Document Revised: 04/17/2016 Document Reviewed: 05/30/2015 Elsevier Interactive Patient Education  2017 [place] Prevention in the Home Falls can cause injuries. They can happen to people of all ages. There are many things you can do to make your home safe and to help prevent falls. What can I do on the outside of my home? Regularly fix the edges of walkways and driveways and fix any cracks. Remove anything that might make you trip as you walk through a door, such as a raised step or threshold. Trim any bushes or trees on the path to your home. Use bright outdoor lighting. Clear any walking paths of anything that might make someone trip, such as rocks or tools. Regularly check to see if handrails are loose or broken. Make sure that both sides of any steps have handrails. Any raised decks and porches should have guardrails on the edges. Have any leaves, snow, or ice cleared regularly. Use sand or salt on walking paths during winter. Clean up any spills in your garage right away. This includes oil or grease spills. What can I do in the bathroom? Use night lights. Install grab bars by the toilet and in the tub and shower. Do not use towel bars as grab bars. Use non-skid mats or decals in the tub or shower. If you need to sit down in the shower, use a plastic, non-slip stool. Keep the floor dry. Clean up any water that spills on the floor as soon as it happens. Remove soap buildup in the tub or shower regularly. Attach bath mats securely with double-sided non-slip rug tape. Do not have throw  rugs and other things on the floor that can make you trip. What can I do in the bedroom? Use night lights. Make sure that you have a light by your bed that is easy to reach. Do not use any sheets or blankets that are too big for your bed. They should not hang down onto the floor. Have a firm chair that has side arms. You can use this for support while you get dressed. Do not have throw rugs and other things on the floor that can make you trip. What can I do in the kitchen? Clean up any spills right away. Avoid walking on wet floors. Keep items that you use a lot in easy-to-reach places. If you need to reach something above you, use a strong step stool that has a grab bar. Keep electrical cords out of the way. Do not use floor polish or wax that makes floors slippery. If you must use wax, use non-skid floor wax. Do not have throw rugs and other things on the floor that can make you trip. What can I do with my stairs? Do not leave any items on the stairs. Make sure that  there are handrails on both sides of the stairs and use them. Fix handrails that are broken or loose. Make sure that handrails are as long as the stairways. Check any carpeting to make sure that it is firmly attached to the stairs. Fix any carpet that is loose or worn. Avoid having throw rugs at the top or bottom of the stairs. If you do have throw rugs, attach them to the floor with carpet tape. Make sure that you have a light switch at the top of the stairs and the bottom of the stairs. If you do not have them, ask someone to add them for you. What else can I do to help prevent falls? Wear shoes that: Do not have high heels. Have rubber bottoms. Are comfortable and fit you well. Are closed at the toe. Do not wear sandals. If you use a stepladder: Make sure that it is fully opened. Do not climb a closed stepladder. Make sure that both sides of the stepladder are locked into place. Ask someone to hold it for you, if  possible. Clearly mark and make sure that you can see: Any grab bars or handrails. First and last steps. Where the edge of each step is. Use tools that help you move around (mobility aids) if they are needed. These include: Canes. Walkers. Scooters. Crutches. Turn on the lights when you go into a dark area. Replace any light bulbs as soon as they burn out. Set up your furniture so you have a clear path. Avoid moving your furniture around. If any of your floors are uneven, fix them. If there are any pets around you, be aware of where they are. Review your medicines with your doctor. Some medicines can make you feel dizzy. This can increase your chance of falling. Ask your doctor what other things that you can do to help prevent falls. This information is not intended to replace advice given to you by your health care provider. Make sure you discuss any questions you have with your health care provider. Document Released: 05/25/2009 Document Revised: 01/04/2016 Document Reviewed: 09/02/2014 Elsevier Interactive Patient Education  2017 [employer].

## 2021-07-10 ENCOUNTER — Encounter: Payer: Self-pay | Admitting: Family

## 2021-07-10 ENCOUNTER — Ambulatory Visit (INDEPENDENT_AMBULATORY_CARE_PROVIDER_SITE_OTHER): Payer: Medicare Other | Admitting: Family

## 2021-07-10 ENCOUNTER — Ambulatory Visit (INDEPENDENT_AMBULATORY_CARE_PROVIDER_SITE_OTHER): Payer: Medicare Other

## 2021-07-10 ENCOUNTER — Other Ambulatory Visit: Payer: Self-pay

## 2021-07-10 DIAGNOSIS — M542 Cervicalgia: Secondary | ICD-10-CM | POA: Diagnosis not present

## 2021-07-10 DIAGNOSIS — I700 Atherosclerosis of aorta: Secondary | ICD-10-CM

## 2021-07-10 DIAGNOSIS — M62838 Other muscle spasm: Secondary | ICD-10-CM

## 2021-07-10 DIAGNOSIS — F3342 Major depressive disorder, recurrent, in full remission: Secondary | ICD-10-CM | POA: Diagnosis not present

## 2021-07-10 LAB — BASIC METABOLIC PANEL
BUN: 15 mg/dL (ref 6–23)
CO2: 28 mEq/L (ref 19–32)
Calcium: 10.6 mg/dL — ABNORMAL HIGH (ref 8.4–10.5)
Chloride: 102 mEq/L (ref 96–112)
Creatinine, Ser: 1.21 mg/dL — ABNORMAL HIGH (ref 0.40–1.20)
GFR: 40.35 mL/min — ABNORMAL LOW (ref >60.00)
Glucose, Bld: 91 mg/dL (ref 70–99)
Potassium: 4.3 mEq/L (ref 3.5–5.1)
Sodium: 136 mEq/L (ref 135–145)

## 2021-07-10 MED ORDER — ROSUVASTATIN CALCIUM 5 MG PO TABS: 5.0000 mg | ORAL_TABLET | Freq: Every evening | ORAL | 3 refills | Status: AC

## 2021-07-10 NOTE — Progress Notes (Signed)
Subjective:    Patient ID: [name], female    DOB: July 07, 1932, 87 y.o.   MRN: [id number]  CC: [name] is a 87 y.o. female who presents today for follow up.   HPI: Accompanied by daughter and son She complains upper back pain across her back for the past couple of days, comes and goes.  No fever, rash, numbness, HA.  Pain is worse with movement and resolves with rest.  She hasnt tried any medication for this.  No falls.   She has right low back pain and right lateral hip has resolved.  She hasnt taken the  No groin pain , saddle anesthesia, constipation,leg numbness  Took norco once or twice relief.   Seen in ED 3 weeks ago for right flank pain.  Lipase, troponin within normal limits. Crt 1.16 ( p rior 1.09) CT right hip is negative for fracture, dislocation.  Significant for degenerative changes which were also seen on Xray pelvis CT renal was negative for renal stone, hydronephrosis, diverticulitis CT renal showed atherosclerotic calcifications. X-ray lumbar spine showed multilevel disc degeneration, grade 1 anterolisthesis Xr thoracic spine showed mild thoracic kyphosis which is progressed.  Multilevel disc degeneration and spurring.     Using rollator  Major depressive disorder-continues to follow with Dr. Shea Evans, last seen 06/23/2019.  She is compliant with Lexapro, and increased BuSpar at that time.  Regarding dementia-compliant with memantine.  Appointment scheduled for follow-up with neurology 11/08/20 HISTORY:  Past Medical History:  Diagnosis Date   Anxiety    Depression    GERD (gastroesophageal reflux disease)    Hypercalcemia    Pneumonia    Sepsis ([place])    Sleep apnea    SOB (shortness of breath) on exertion    Past Surgical History:  Procedure Laterality Date   ENDOMETRIAL ABLATION     EYE SURGERY  2011   left cataract   KNEE ARTHROSCOPY Left 05/03/2015   Procedure: left knee arthroscopy, partial medial menisectomy, chondroplasty;   Surgeon: Dereck Leep, MD;  Location: [facility] ORS;  Service: Orthopedics;  Laterality: Left;   TONSILLECTOMY     Family History  Problem Relation Age of Onset   Cancer Mother 9       ovarian    Pernicious anemia Mother    COPD Father        nonsmoker   Cancer Cousin        ovarian ca    Allergies: Codeine, Darvon [propoxyphene hcl], and Propoxyphene Current Outpatient Medications on File Prior to Visit  Medication Sig Dispense Refill   busPIRone (BUSPAR) 10 MG tablet Take 2 tablets (20 mg total) by mouth at bedtime. 180 tablet 0   cetirizine (ZYRTEC) 5 MG tablet Take 1 tablet (5 mg total) by mouth daily. 90 tablet 1   Cholecalciferol (VITAMIN D-3 PO) Take 1 capsule by mouth daily. 500iu     clotrimazole-betamethasone (LOTRISONE) cream Apply 1 application topically daily. 30 g 3   escitalopram (LEXAPRO) 10 MG tablet Take 1 tablet (10 mg total) by mouth daily. 90 tablet 3   gentamicin ointment (GARAMYCIN) 0.1 % Apply topically 3 (three) times daily.     HYDROcodone-acetaminophen (NORCO) 5-325 MG tablet Take 1 tablet by mouth every 6 (six) hours as needed for moderate pain. 15 tablet 0   ipratropium (ATROVENT) 0.03 % nasal spray SMARTSIG:1-2 Spray(s) Both Nares 3 Times Daily PRN     loperamide (IMODIUM) 2 MG capsule Take 2 mg by mouth 2 (two) times  daily.     memantine (NAMENDA) 5 MG tablet Take 1 tablet by mouth 2 (two) times daily.     triamcinolone cream (KENALOG) 0.1 % Apply topically 2 (two) times daily. 454 g 1   vitamin B-12 (CYANOCOBALAMIN) 1000 MCG tablet Take by mouth.     famotidine (PEPCID) 20 MG tablet Take 1 tablet (20 mg total) by mouth every evening. 90 tablet 0   No current facility-administered medications on file prior to visit.    Social History   Tobacco Use   Smoking status: Never   Smokeless tobacco: Never  Vaping Use   Vaping Use: Never used  Substance Use Topics   Alcohol use: No   Drug use: No    Review of Systems  Constitutional:  Negative for  chills and fever.  Eyes:  Negative for visual disturbance.  Respiratory:  Negative for cough.   Cardiovascular:  Negative for chest pain, palpitations and leg swelling.  Gastrointestinal:  Negative for abdominal pain, constipation, nausea and vomiting.  Musculoskeletal:  Positive for neck pain. Negative for back pain.  Neurological:  Negative for numbness.     Objective:    BP 112/62 (BP Location: Left Arm, Patient Position: Sitting, Cuff Size: Normal)   Pulse 73   Temp 98.5 F (36.9 C) (Oral)   Ht 5\' 3"  (1.6 m)   Wt 177 lb 9.6 oz (80.6 kg)   SpO2 98%   BMI 31.46 kg/m  BP Readings from Last 3 Encounters:  07/10/21 112/62  06/14/21 (!) 146/63  04/02/21 112/60   Wt Readings from Last 3 Encounters:  07/10/21 177 lb 9.6 oz (80.6 kg)  06/29/21 185 lb (83.9 kg)  06/13/21 185 lb (83.9 kg)    Physical Exam Vitals reviewed.  Constitutional:      Appearance: She is well-developed.  Eyes:     Conjunctiva/sclera: Conjunctivae normal.  Cardiovascular:     Rate and Rhythm: Normal rate and regular rhythm.     Pulses: Normal pulses.     Heart sounds: Normal heart sounds.  Pulmonary:     Effort: Pulmonary effort is normal.     Breath sounds: Normal breath sounds. No wheezing, rhonchi or rales.  Abdominal:     Palpations: Abdomen is soft.     Tenderness: There is no abdominal tenderness.  Musculoskeletal:     Cervical back: Spasms present. No edema, erythema, signs of trauma, lacerations, tenderness or bony tenderness. No pain with movement. Normal range of motion.     Lumbar back: No swelling, edema, spasms, tenderness or bony tenderness. Normal range of motion.       Back:     Right lower leg: No edema.     Left lower leg: No edema.     Comments: Diffuse muscular spasm appreciated over trapezius muscle.  Full range of motion with flexion, tension, lateral side bends. No bony tenderness. No pain, numbness, tingling elicited with single leg raise bilaterally. Weakness of left leg  with SLR.   Skin:    General: Skin is warm and dry.  Neurological:     Mental Status: She is alert.     Sensory: No sensory deficit.     Deep Tendon Reflexes:     Reflex Scores:      Patellar reflexes are 2+ on the right side and 2+ on the left side.    Comments: Sensation and strength intact bilateral lower extremities.  Psychiatric:        Speech: Speech normal.  Behavior: Behavior normal.        Thought Content: Thought content normal.       Assessment & Plan:   Problem List Items Addressed This Visit       Cardiovascular and Mediastinum   Atherosclerosis of aorta [facility])    Discussed CT renal where atherosclerosis was identified.  Patient and family agreeable to trying low-dose of Crestor for ASCVD risk reduction. Pending cmp in 6 weeks.      Relevant Medications   rosuvastatin (CRESTOR) 5 MG tablet   Other Relevant Orders   Basic metabolic panel   Comprehensive metabolic panel     Other   MDD (major depressive disorder), recurrent, in full remission (HCC)    Chronic, stable.  She continues to follow closely with psychiatry.  Will follow      Muscle spasm    Symptoms c/s muscular spasm, suspect underlying cervical DDD.  Low back and abdominal pain fully resolved today.  Pending c spine xray. Discussed conservative management including alternating heat/ice, topical agents including Salonpas pain patch, Voltaren gel.  Difficulty swallowing large pills and recommended oral Tylenol.  Advised against using NSAIDs at this time. If  pain were to worsen fail to respond to conservative measures, discussed considering referral to physiatry, orthopedics.  Patient will let me know how she is doing      Relevant Orders   DG Cervical Spine Complete     I am having [name] start on rosuvastatin. I am also having her maintain her Cholecalciferol (VITAMIN D-3 PO), loperamide, triamcinolone cream, vitamin B-12, ipratropium, gentamicin ointment, famotidine, cetirizine,  clotrimazole-betamethasone, memantine, HYDROcodone-acetaminophen, busPIRone, and escitalopram.   Meds ordered this encounter  Medications   rosuvastatin (CRESTOR) 5 MG tablet    Sig: Take 1 tablet (5 mg total) by mouth every evening.    Dispense:  90 tablet    Refill:  3    Order Specific Question:   Supervising Provider    Answer:   Crecencio Mc [[id number]]    Return precautions given.   Risks, benefits, and alternatives of the medications and treatment plan prescribed today were discussed, and patient expressed understanding.   Education regarding symptom management and diagnosis given to patient on AVS.  Continue to follow with Burnard Hawthorne, FNP for routine health maintenance.   [name] and I agreed with plan.   [name], FNP

## 2021-07-10 NOTE — Assessment & Plan Note (Addendum)
Symptoms c/s muscular spasm, suspect underlying cervical DDD.  Low back and abdominal pain fully resolved today.  Pending c spine xray. Discussed conservative management including alternating heat/ice, topical agents including Salonpas pain patch, Voltaren gel.  Difficulty swallowing large pills and recommended oral Tylenol.  Advised against using NSAIDs at this time. If  pain were to worsen fail to respond to conservative measures, discussed considering referral to physiatry, orthopedics.  Patient will let me know how she is doing

## 2021-07-10 NOTE — Assessment & Plan Note (Signed)
Discussed CT renal where atherosclerosis was identified.  Patient and family agreeable to trying low-dose of Crestor for ASCVD risk reduction. Pending cmp in 6 weeks.

## 2021-07-10 NOTE — Assessment & Plan Note (Signed)
Chronic, stable.  She continues to follow closely with psychiatry.  Will follow

## 2021-07-10 NOTE — Patient Instructions (Addendum)
Start crestor 5mg .  Lab in 6 weeks  As discussed, please focus on conservative management of the upper back pain.  Gentle stretching, massage and heat are excellent for muscle tightness and spasm.  As discussed, you may take liquid Tylenol 160mg  /85mL per below.  Each 5-milliliter dose contains 160 milligrams of the pain reliever  Adults and children 12 years and over: Take 20 mL (2 x 10 mL) every 4 hours while symptoms last. For adult dosing, the dosing cup needs to be filled to 10 mL line twice. Do not take more than 5 doses (100 mL) in 24 hours unless directed by a doctor  You may also try Biofreeze, Salonpas pain patch. Please let me know of any further concerns

## 2021-09-10 ENCOUNTER — Other Ambulatory Visit: Payer: Self-pay

## 2021-09-10 ENCOUNTER — Ambulatory Visit (INDEPENDENT_AMBULATORY_CARE_PROVIDER_SITE_OTHER): Payer: Medicare Other | Admitting: Family

## 2021-09-10 ENCOUNTER — Other Ambulatory Visit: Payer: Medicare Other

## 2021-09-10 ENCOUNTER — Encounter: Payer: Self-pay | Admitting: Family

## 2021-09-10 VITALS — BP 123/65 | HR 73 | Temp 98.6°F | Ht 63.0 in | Wt 176.8 lb

## 2021-09-10 DIAGNOSIS — M62838 Other muscle spasm: Secondary | ICD-10-CM | POA: Diagnosis not present

## 2021-09-10 DIAGNOSIS — R21 Rash and other nonspecific skin eruption: Secondary | ICD-10-CM | POA: Diagnosis not present

## 2021-09-10 DIAGNOSIS — I700 Atherosclerosis of aorta: Secondary | ICD-10-CM

## 2021-09-10 MED ORDER — CETIRIZINE HCL 5 MG PO TABS: 5.0000 mg | ORAL_TABLET | Freq: Every day | ORAL | 1 refills | Status: DC

## 2021-09-10 MED ORDER — FAMOTIDINE 20 MG PO TABS: 20.0000 mg | ORAL_TABLET | Freq: Every day | ORAL | 1 refills | Status: DC

## 2021-09-10 NOTE — Assessment & Plan Note (Signed)
Symptoms completely resolved at this time.  Advised patient and daughter to maintain close vigilance and let me know if recurs.  will follow

## 2021-09-10 NOTE — Assessment & Plan Note (Signed)
Chronic.  Responded well to histamine blockade with Pepcid, Zyrtec.  I have refilled both.  Patient and daughter will let me know how she is doing.

## 2021-09-10 NOTE — Progress Notes (Signed)
Subjective:    Patient ID: [name], female    DOB: April 09, 1932, 88 y.o.   MRN: [id number]  CC: [name] is a 88 y.o. female who presents today for follow up.   HPI: Follow-up Accompanied by daughter today Feels well today No new complaints  She continues to have itching skin 'all over'.  She scratches to the point of bleeding. This has been ongoing.  Worse after bath.  Zyrtec and pepcid had been helpful in the past.Daughter requests refill as ran out.    Atherosclerosis-started Crestor 5 mg at last visit. Tolerating well.   Muscle spasm- she has had no further low back, abdominal pain.      HISTORY:  Past Medical History:  Diagnosis Date   Anxiety    Depression    GERD (gastroesophageal reflux disease)    Hypercalcemia    Pneumonia    Sepsis ([place])    Sleep apnea    SOB (shortness of breath) on exertion    Past Surgical History:  Procedure Laterality Date   ENDOMETRIAL ABLATION     EYE SURGERY  2011   left cataract   KNEE ARTHROSCOPY Left 05/03/2015   Procedure: left knee arthroscopy, partial medial menisectomy, chondroplasty;  Surgeon: Dereck Leep, MD;  Location: [facility] ORS;  Service: Orthopedics;  Laterality: Left;   TONSILLECTOMY     Family History  Problem Relation Age of Onset   Cancer Mother 60       ovarian    Pernicious anemia Mother    COPD Father        nonsmoker   Cancer Cousin        ovarian ca    Allergies: Codeine, Darvon [propoxyphene hcl], and Propoxyphene Current Outpatient Medications on File Prior to Visit  Medication Sig Dispense Refill   busPIRone (BUSPAR) 10 MG tablet Take 2 tablets (20 mg total) by mouth at bedtime. 180 tablet 0   Cholecalciferol (VITAMIN D-3 PO) Take 1 capsule by mouth daily. 500iu     clotrimazole-betamethasone (LOTRISONE) cream Apply 1 application topically daily. 30 g 3   escitalopram (LEXAPRO) 10 MG tablet Take 1 tablet (10 mg total) by mouth daily. 90 tablet 3   gentamicin ointment (GARAMYCIN) 0.1 %  Apply topically 3 (three) times daily.     HYDROcodone-acetaminophen (NORCO) 5-325 MG tablet Take 1 tablet by mouth every 6 (six) hours as needed for moderate pain. 15 tablet 0   ipratropium (ATROVENT) 0.03 % nasal spray SMARTSIG:1-2 Spray(s) Both Nares 3 Times Daily PRN     memantine (NAMENDA) 5 MG tablet Take 1 tablet by mouth 2 (two) times daily.     rosuvastatin (CRESTOR) 5 MG tablet Take 1 tablet (5 mg total) by mouth every evening. 90 tablet 3   triamcinolone cream (KENALOG) 0.1 % Apply topically 2 (two) times daily. 454 g 1   vitamin B-12 (CYANOCOBALAMIN) 1000 MCG tablet Take by mouth.     No current facility-administered medications on file prior to visit.    Social History   Tobacco Use   Smoking status: Never   Smokeless tobacco: Never  Vaping Use   Vaping Use: Never used  Substance Use Topics   Alcohol use: No   Drug use: No    Review of Systems  Constitutional:  Negative for chills and fever.  Respiratory:  Negative for cough.   Cardiovascular:  Negative for chest pain and palpitations.  Gastrointestinal:  Negative for abdominal pain, nausea and vomiting.  Musculoskeletal:  Negative  for back pain.  Skin:  Positive for rash.     Objective:    BP 123/65    Pulse 73    Temp 98.6 F (37 C) (Oral)    Ht 5\' 3"  (1.6 m)    Wt 176 lb 12.8 oz (80.2 kg)    SpO2 99%    BMI 31.32 kg/m  BP Readings from Last 3 Encounters:  09/10/21 123/65  07/10/21 112/62  04/02/21 112/60   Wt Readings from Last 3 Encounters:  09/10/21 176 lb 12.8 oz (80.2 kg)  07/10/21 177 lb 9.6 oz (80.6 kg)  06/29/21 185 lb (83.9 kg)    Physical Exam Vitals reviewed.  Constitutional:      Appearance: She is well-developed.  Eyes:     Conjunctiva/sclera: Conjunctivae normal.  Cardiovascular:     Rate and Rhythm: Normal rate and regular rhythm.     Pulses: Normal pulses.     Heart sounds: Normal heart sounds.  Pulmonary:     Effort: Pulmonary effort is normal.     Breath sounds: Normal breath  sounds. No wheezing, rhonchi or rales.  Skin:    General: Skin is warm and dry.  Neurological:     Mental Status: She is alert.  Psychiatric:        Speech: Speech normal.        Behavior: Behavior normal.        Thought Content: Thought content normal.       Assessment & Plan:   Problem List Items Addressed This Visit       Cardiovascular and Mediastinum   Atherosclerosis of aorta (HCC)    Chronic, stable.  Continue Crestor 5 mg        Musculoskeletal and Integument   Rash - Primary    Chronic.  Responded well to histamine blockade with Pepcid, Zyrtec.  I have refilled both.  Patient and daughter will let me know how she is doing.      Relevant Medications   cetirizine (ZYRTEC) 5 MG tablet   famotidine (PEPCID) 20 MG tablet   Other Relevant Orders   Comprehensive metabolic panel   TSH     Other   Muscle spasm    Symptoms completely resolved at this time.  Advised patient and daughter to maintain close vigilance and let me know if recurs.  will follow        I have discontinued Rudell M. Bednarczyk's loperamide and famotidine. I am also having her start on famotidine. Additionally, I am having her maintain her Cholecalciferol (VITAMIN D-3 PO), triamcinolone cream, vitamin B-12, ipratropium, gentamicin ointment, clotrimazole-betamethasone, memantine, HYDROcodone-acetaminophen, busPIRone, escitalopram, rosuvastatin, and cetirizine.   Meds ordered this encounter  Medications   cetirizine (ZYRTEC) 5 MG tablet    Sig: Take 1 tablet (5 mg total) by mouth daily.    Dispense:  90 tablet    Refill:  1    Order Specific Question:   Supervising Provider    Answer:   Deborra Medina L [[id number]]   famotidine (PEPCID) 20 MG tablet    Sig: Take 1 tablet (20 mg total) by mouth at bedtime.    Dispense:  90 tablet    Refill:  1    Order Specific Question:   Supervising Provider    Answer:   Crecencio Mc [[id number]]    Return precautions given.   Risks, benefits, and alternatives of  the medications and treatment plan prescribed today were discussed, and patient expressed understanding.   Education regarding  symptom management and diagnosis given to patient on AVS.  Continue to follow with Burnard Hawthorne, FNP for routine health maintenance.   [name] and I agreed with plan.   [name], FNP

## 2021-09-10 NOTE — Assessment & Plan Note (Signed)
Chronic, stable.  Continue Crestor 5 mg 

## 2021-09-11 ENCOUNTER — Other Ambulatory Visit: Payer: Self-pay | Admitting: Family

## 2021-09-11 DIAGNOSIS — N189 Chronic kidney disease, unspecified: Secondary | ICD-10-CM

## 2021-09-11 LAB — COMPREHENSIVE METABOLIC PANEL
ALT: 8 U/L (ref 0–35)
AST: 13 U/L (ref 0–37)
Albumin: 4 g/dL (ref 3.5–5.2)
Alkaline Phosphatase: 62 U/L (ref 39–117)
BUN: 16 mg/dL (ref 6–23)
CO2: 32 mEq/L (ref 19–32)
Calcium: 10.7 mg/dL — ABNORMAL HIGH (ref 8.4–10.5)
Chloride: 102 mEq/L (ref 96–112)
Creatinine, Ser: 1.29 mg/dL — ABNORMAL HIGH (ref 0.40–1.20)
GFR: 37.32 mL/min — ABNORMAL LOW (ref >60.00)
Glucose, Bld: 86 mg/dL (ref 70–99)
Potassium: 4.5 mEq/L (ref 3.5–5.1)
Sodium: 139 mEq/L (ref 135–145)
Total Bilirubin: 0.6 mg/dL (ref 0.2–1.2)
Total Protein: 6.6 g/dL (ref 6.0–8.3)

## 2021-09-11 LAB — TSH: TSH: 3.19 u[IU]/mL (ref 0.35–5.50)

## 2021-09-21 ENCOUNTER — Telehealth (INDEPENDENT_AMBULATORY_CARE_PROVIDER_SITE_OTHER): Payer: Medicare Other | Admitting: Psychiatry

## 2021-09-21 ENCOUNTER — Other Ambulatory Visit: Payer: Self-pay

## 2021-09-21 ENCOUNTER — Encounter: Payer: Self-pay | Admitting: Psychiatry

## 2021-09-21 DIAGNOSIS — F02818 Dementia in other diseases classified elsewhere, unspecified severity, with other behavioral disturbance: Secondary | ICD-10-CM | POA: Diagnosis not present

## 2021-09-21 DIAGNOSIS — F418 Other specified anxiety disorders: Secondary | ICD-10-CM | POA: Diagnosis not present

## 2021-09-21 DIAGNOSIS — F3342 Major depressive disorder, recurrent, in full remission: Secondary | ICD-10-CM | POA: Diagnosis not present

## 2021-09-21 DIAGNOSIS — G3183 Dementia with Lewy bodies: Secondary | ICD-10-CM | POA: Diagnosis not present

## 2021-09-21 MED ORDER — BUSPIRONE HCL 10 MG PO TABS: 20.0000 mg | ORAL_TABLET | Freq: Every day | ORAL | 3 refills | Status: AC

## 2021-09-21 NOTE — Progress Notes (Signed)
Virtual Visit via Video Note  I connected with [name] on 09/21/21 at 10:40 AM EST by a video enabled telemedicine application and verified that I am speaking with the correct person using two identifiers.  Location Provider Location : [facility] Patient Location : Home  Participants: Patient , Daughter, Provider   I discussed the limitations of evaluation and management by telemedicine and the availability of in person appointments. The patient expressed understanding and agreed to proceed.   I discussed the assessment and treatment plan with the patient. The patient was provided an opportunity to ask questions and all were answered. The patient agreed with the plan and demonstrated an understanding of the instructions.   The patient was advised to call back or seek an in-person evaluation if the symptoms worsen or if the condition fails to improve as anticipated.    [place] MD OP Progress Note  09/21/2021 12:34 PM SHALISHA CLAUSING  MRN:  [id number]  Chief Complaint:  Chief Complaint   Follow-up 88 year old Caucasian female with history of MDD, dementia, was evaluated for medication management.    HPI: [name] is an 88 year old Caucasian female who has a history of MDD, dementia, gastroesophageal reflux disease, obstructive sleep apnea compliant on CPAP, osteoporosis, chronic kidney disease stage III was evaluated by telemedicine today.  Patient as well as daughter participated in the session today.  Patient being a limited historian majority of information was provided by her daughter.  Patient today appeared to be less verbal, mostly noncommunicative, when she answered questions she answered in short , soft voice.  Patient appeared to be alert was able to state the month as February with some prompting was able to correctly state February 14 as Valentine's Day.  Patient was able to give her date of birth and was also able to correctly state what she had for breakfast this  morning.  As per daughter patient continues to have memory problems and she constantly repeats questions.  Patient otherwise sleeping okay.  Has been more alert during the day since she currently takes the BuSpar at night.  Does not appear to have any  hallucinations or perceptual disturbances.  Appetite seems to be fair.  Currently taking her antidepressants as well as Namenda.  Tolerating it well so far.  Denies any other concerns today.  Visit Diagnosis:    ICD-10-CM   1. MDD (major depressive disorder), recurrent, in full remission ([place])  F33.42 busPIRone (BUSPAR) 10 MG tablet    2. Lewy body dementia with behavioral disturbance (HCC)  G31.83    F02.818     3. Other specified anxiety disorders  F41.8 busPIRone (BUSPAR) 10 MG tablet   with limited symptom attacks      Past Psychiatric History: Reviewed past psychiatric history from progress note on 01/04/2020.  Past Medical History:  Past Medical History:  Diagnosis Date   Anxiety    Depression    GERD (gastroesophageal reflux disease)    Hypercalcemia    Pneumonia    Sepsis ([place])    Sleep apnea    SOB (shortness of breath) on exertion     Past Surgical History:  Procedure Laterality Date   ENDOMETRIAL ABLATION     EYE SURGERY  2011   left cataract   KNEE ARTHROSCOPY Left 05/03/2015   Procedure: left knee arthroscopy, partial medial menisectomy, chondroplasty;  Surgeon: Dereck Leep, MD;  Location: [facility] ORS;  Service: Orthopedics;  Laterality: Left;   TONSILLECTOMY      Family  Psychiatric History: Reviewed family psychiatric history from progress note on 01/04/2020.  Family History:  Family History  Problem Relation Age of Onset   Cancer Mother 32       ovarian    Pernicious anemia Mother    COPD Father        nonsmoker   Cancer Cousin        ovarian ca    Social History: Reviewed social history from progress note on 01/04/2020. Social History   Socioeconomic History   Marital status: Widowed    Spouse  name: Not on file   Number of children: 5   Years of education: Not on file   Highest education level: Not on file  Occupational History   Occupation: retired  Tobacco Use   Smoking status: Never   Smokeless tobacco: Never  Vaping Use   Vaping Use: Never used  Substance and Sexual Activity   Alcohol use: No   Drug use: No   Sexual activity: Not Currently  Other Topics Concern   Not on file  Social History Narrative   Regular exercise: not really   Caffeine use: some   Daughter lives with her.    Social Determinants of Health   Financial Resource Strain: Low Risk    Difficulty of Paying Living Expenses: Not hard at all  Food Insecurity: No Food Insecurity   Worried About Charity fundraiser in the Last Year: Never true   [place] in the Last Year: Never true  Transportation Needs: No Transportation Needs   Lack of Transportation (Medical): No   Lack of Transportation (Non-Medical): No  Physical Activity: Not on file  Stress: No Stress Concern Present   Feeling of Stress : Not at all  Social Connections: Moderately Isolated   Frequency of Communication with Friends and Family: Once a week   Frequency of Social Gatherings with Friends and Family: More than three times a week   Attends Religious Services: More than 4 times per year   Active Member of [employer] or Organizations: No   Attends Archivist Meetings: Never   Marital Status: Widowed    Allergies:  Allergies  Allergen Reactions   Codeine Other (See Comments)    Crying spells   Darvon [Propoxyphene Hcl] Other (See Comments)    Crying spells   Propoxyphene Other (See Comments)    Crying, altered mental status    Metabolic Disorder Labs: Lab Results  Component Value Date   HGBA1C 6.0 08/21/2017   Lab Results  Component Value Date   PROLACTIN 11.9 05/12/2020   Lab Results  Component Value Date   CHOL 166 02/19/2021   TRIG 139.0 02/19/2021   HDL 44.40 02/19/2021   CHOLHDL 4 02/19/2021    VLDL 27.8 02/19/2021   LDLCALC 93 02/19/2021   LDLCALC 120 (H) 05/12/2020   Lab Results  Component Value Date   TSH 3.19 09/10/2021   TSH 3.68 05/12/2020    Therapeutic Level Labs: No results found for: LITHIUM No results found for: VALPROATE No components found for:  CBMZ  Current Medications: Current Outpatient Medications  Medication Sig Dispense Refill   busPIRone (BUSPAR) 10 MG tablet Take 2 tablets (20 mg total) by mouth at bedtime. 180 tablet 3   cetirizine (ZYRTEC) 5 MG tablet Take 1 tablet (5 mg total) by mouth daily. 90 tablet 1   Cholecalciferol (VITAMIN D-3 PO) Take 1 capsule by mouth daily. 500iu     clotrimazole-betamethasone (LOTRISONE) cream Apply 1  application topically daily. 30 g 3   escitalopram (LEXAPRO) 10 MG tablet Take 1 tablet (10 mg total) by mouth daily. 90 tablet 3   famotidine (PEPCID) 20 MG tablet Take 1 tablet (20 mg total) by mouth at bedtime. 90 tablet 1   gentamicin ointment (GARAMYCIN) 0.1 % Apply topically 3 (three) times daily.     HYDROcodone-acetaminophen (NORCO) 5-325 MG tablet Take 1 tablet by mouth every 6 (six) hours as needed for moderate pain. 15 tablet 0   ipratropium (ATROVENT) 0.03 % nasal spray SMARTSIG:1-2 Spray(s) Both Nares 3 Times Daily PRN     memantine (NAMENDA) 5 MG tablet Take 1 tablet by mouth 2 (two) times daily.     rosuvastatin (CRESTOR) 5 MG tablet Take 1 tablet (5 mg total) by mouth every evening. 90 tablet 3   triamcinolone cream (KENALOG) 0.1 % Apply topically 2 (two) times daily. 454 g 1   vitamin B-12 (CYANOCOBALAMIN) 1000 MCG tablet Take by mouth.     No current facility-administered medications for this visit.     Musculoskeletal: Strength & Muscle Tone:  UTA Gait & Station:  Seated Patient leans: N/A  Psychiatric Specialty Exam: Review of Systems  Unable to perform ROS: Dementia  All other systems reviewed and are negative.  There were no vitals taken for this visit.There is no height or weight on  file to calculate BMI.  General Appearance: Casual  Eye Contact:  Poor  Speech:  Normal Rate  Volume:  Decreased  Mood:  Euthymic  Affect:  Restricted  Thought Process:  Goal Directed and Descriptions of Associations: Intact  Orientation:  Other:  self, situation  Thought Content: Logical   Suicidal Thoughts:  No  Homicidal Thoughts:  No  Memory:  Immediate;   Fair Recent;   limited Remote;   Poor  Judgement:  Fair  Insight:  Shallow  Psychomotor Activity:  Decreased  Concentration:  Concentration: Poor and Attention Span: Poor  Recall:  Poor  Fund of Knowledge: Poor  Language: Fair  Akathisia:  No  Handed:  Right  AIMS (if indicated): not done  Assets:  Housing Social Support  ADL's:  Intact with support  Cognition: Impaired,  Mild to moderate  Sleep:  Fair   Screenings: [place] Office Visit from 06/16/2017 in [place] Office Visit from 02/07/2016 in [place] Total Score 0 [place] Visit from 10/09/2015 in [place]  Total Score (max 30 points ) 27      PHQ2-9    [place] from 06/29/2021 in [place] Office Visit from 04/02/2021 in [facility] Video Visit from 10/26/2020 in [place] from 06/28/2020 in [place] Office Visit from 05/12/2020 in [place]  PHQ-2 Total Score 0 0 1 0 0      Flowsheet Row Video Visit from 03/23/2021 in [place] Video Visit from 10/26/2020 in [place] Error: Question 1 not populated No Risk        Assessment and Plan: SHULAMIT DONOFRIO is a 88 year old Caucasian female, lives in [place], has a history of MDD, dementia, anxiety disorder was evaluated by  telemedicine today.  Patient is currently improved now that she is on a one-time dosage of BuSpar at bedtime.  Plan as  noted below.  Plan MDD full remission Lexapro 10 mg p.o. daily  Lewy body dementia-chronic Patient currently on memantine. Patient has good social support from her daughter who lives with her.  Other specified anxiety-limited symptom attacks-improving BuSpar 20 mg p.o. nightly   Collateral information obtained from daughter as noted above.  Follow-up in clinic in 3 to 4 months or sooner in person.  This note was generated in part or whole with voice recognition software. Voice recognition is usually quite accurate but there are transcription errors that can and very often do occur. I apologize for any typographical errors that were not detected and corrected.     Ursula Alert, MD 09/21/2021, 12:34 PM

## 2021-10-12 DIAGNOSIS — B078 Other viral warts: Secondary | ICD-10-CM | POA: Diagnosis not present

## 2021-10-12 DIAGNOSIS — D2261 Melanocytic nevi of right upper limb, including shoulder: Secondary | ICD-10-CM | POA: Diagnosis not present

## 2021-10-12 DIAGNOSIS — D225 Melanocytic nevi of trunk: Secondary | ICD-10-CM | POA: Diagnosis not present

## 2021-10-12 DIAGNOSIS — L821 Other seborrheic keratosis: Secondary | ICD-10-CM | POA: Diagnosis not present

## 2021-10-12 DIAGNOSIS — D2262 Melanocytic nevi of left upper limb, including shoulder: Secondary | ICD-10-CM | POA: Diagnosis not present

## 2021-10-18 ENCOUNTER — Other Ambulatory Visit: Payer: Self-pay | Admitting: Nephrology

## 2021-10-18 DIAGNOSIS — N1832 Chronic kidney disease, stage 3b: Secondary | ICD-10-CM | POA: Diagnosis not present

## 2021-10-25 ENCOUNTER — Ambulatory Visit
Admission: RE | Admit: 2021-10-25 | Discharge: 2021-10-25 | Disposition: A | Discharge status: Home / Self Care | Payer: Medicare Other | Source: Ambulatory Visit | Attending: Nephrology | Admitting: Nephrology

## 2021-10-25 ENCOUNTER — Other Ambulatory Visit: Payer: Self-pay

## 2021-10-25 DIAGNOSIS — N1832 Chronic kidney disease, stage 3b: Secondary | ICD-10-CM | POA: Diagnosis not present

## 2021-10-25 DIAGNOSIS — N281 Cyst of kidney, acquired: Secondary | ICD-10-CM | POA: Diagnosis not present

## 2021-10-25 DIAGNOSIS — N189 Chronic kidney disease, unspecified: Secondary | ICD-10-CM | POA: Diagnosis not present

## 2021-11-02 ENCOUNTER — Other Ambulatory Visit: Payer: Self-pay

## 2021-11-02 ENCOUNTER — Encounter: Payer: Self-pay | Admitting: Family

## 2021-11-02 ENCOUNTER — Ambulatory Visit: Payer: Medicare Other | Admitting: Family

## 2021-11-02 DIAGNOSIS — I700 Atherosclerosis of aorta: Secondary | ICD-10-CM

## 2021-11-02 DIAGNOSIS — R21 Rash and other nonspecific skin eruption: Secondary | ICD-10-CM | POA: Diagnosis not present

## 2021-11-02 NOTE — Assessment & Plan Note (Signed)
Chronic, symptomatically stable.  Continue Crestor 5 mg.  LDL less than 100 ?

## 2021-11-02 NOTE — Assessment & Plan Note (Addendum)
Resolved.  Continue Pepcid AC '20mg'$ , Zyrtec '5mg'$ .  Advised she may use topical antihistamine for breakthrough itching ?

## 2021-11-02 NOTE — Progress Notes (Signed)
? ?Subjective:  ? ? Patient ID: [name], female    DOB: 07/26/1932, 88 y.o.   MRN: [id number] ? ?CC: [name] is a 88 y.o. female who presents today for follow up.  ? ?HPI: Feels well today.  No new complaints ?Accompanied by her daughter ? ?HLD-compliant with Crestor 5 mg  ? ?Rash has resolved.  She remains compliant with Pepcid AC, Claritin ? ?continues to follow with nephrology, last seen 10/18/2021 for stage III CKD. ? ?She is following Dr. Shea Evans, psychiatry for depression ?HISTORY:  ?Past Medical History:  ?Diagnosis Date  ? Anxiety   ? Depression   ? GERD (gastroesophageal reflux disease)   ? Hypercalcemia   ? Pneumonia   ? Sepsis ([place])   ? Sleep apnea   ? SOB (shortness of breath) on exertion   ? ?Past Surgical History:  ?Procedure Laterality Date  ? ENDOMETRIAL ABLATION    ? EYE SURGERY  2011  ? left cataract  ? KNEE ARTHROSCOPY Left 05/03/2015  ? Procedure: left knee arthroscopy, partial medial menisectomy, chondroplasty;  Surgeon: Dereck Leep, MD;  Location: [facility] ORS;  Service: Orthopedics;  Laterality: Left;  ? TONSILLECTOMY    ? ?Family History  ?Problem Relation Age of Onset  ? Cancer Mother 85  ?     ovarian   ? Pernicious anemia Mother   ? COPD Father   ?     nonsmoker  ? Cancer Cousin   ?     ovarian ca  ? ? ?Allergies: Codeine, Darvon [propoxyphene hcl], and Propoxyphene ?Current Outpatient Medications on File Prior to Visit  ?Medication Sig Dispense Refill  ? busPIRone (BUSPAR) 10 MG tablet Take 2 tablets (20 mg total) by mouth at bedtime. 180 tablet 3  ? cetirizine (ZYRTEC) 5 MG tablet Take 1 tablet (5 mg total) by mouth daily. 90 tablet 1  ? Cholecalciferol (VITAMIN D-3 PO) Take 1 capsule by mouth daily. 500iu    ? clotrimazole-betamethasone (LOTRISONE) cream Apply 1 application topically daily. 30 g 3  ? escitalopram (LEXAPRO) 10 MG tablet Take 1 tablet (10 mg total) by mouth daily. 90 tablet 3  ? famotidine (PEPCID) 20 MG tablet Take 1 tablet (20 mg total) by mouth at bedtime. 90  tablet 1  ? gentamicin ointment (GARAMYCIN) 0.1 % Apply topically 3 (three) times daily.    ? HYDROcodone-acetaminophen (NORCO) 5-325 MG tablet Take 1 tablet by mouth every 6 (six) hours as needed for moderate pain. 15 tablet 0  ? ipratropium (ATROVENT) 0.03 % nasal spray SMARTSIG:1-2 Spray(s) Both Nares 3 Times Daily PRN    ? memantine (NAMENDA) 5 MG tablet Take 1 tablet by mouth 2 (two) times daily.    ? rosuvastatin (CRESTOR) 5 MG tablet Take 1 tablet (5 mg total) by mouth every evening. 90 tablet 3  ? triamcinolone cream (KENALOG) 0.1 % Apply topically 2 (two) times daily. 454 g 1  ? vitamin B-12 (CYANOCOBALAMIN) 1000 MCG tablet Take by mouth.    ? ?No current facility-administered medications on file prior to visit.  ? ? ?Social History  ? ?Tobacco Use  ? Smoking status: Never  ? Smokeless tobacco: Never  ?Vaping Use  ? Vaping Use: Never used  ?Substance Use Topics  ? Alcohol use: No  ? Drug use: No  ? ? ?Review of Systems  ?Constitutional:  Negative for chills and fever.  ?Respiratory:  Negative for cough.   ?Cardiovascular:  Negative for chest pain, palpitations and leg swelling.  ?Gastrointestinal:  Negative for nausea and vomiting.  ?Skin:  Negative for rash.  ?   ?Objective:  ?  ?BP 118/68 (BP Location: Left Arm, Patient Position: Sitting, Cuff Size: Large)   Pulse 81   Temp 98 ?F (36.7 ?C) (Oral)   Ht '5\' 3"'$  (1.6 m)   Wt 177 lb 9.6 oz (80.6 kg)   SpO2 97%   BMI 31.46 kg/m?  ?BP Readings from Last 3 Encounters:  ?11/02/21 118/68  ?09/10/21 123/65  ?07/10/21 112/62  ? ?Wt Readings from Last 3 Encounters:  ?11/02/21 177 lb 9.6 oz (80.6 kg)  ?09/10/21 176 lb 12.8 oz (80.2 kg)  ?07/10/21 177 lb 9.6 oz (80.6 kg)  ? ? ?Physical Exam ?Vitals reviewed.  ?Constitutional:   ?   Appearance: She is well-developed.  ?Eyes:  ?   Conjunctiva/sclera: Conjunctivae normal.  ?Cardiovascular:  ?   Rate and Rhythm: Normal rate and regular rhythm.  ?   Pulses: Normal pulses.  ?   Heart sounds: Normal heart sounds.   ?Pulmonary:  ?   Effort: Pulmonary effort is normal.  ?   Breath sounds: Normal breath sounds. No wheezing, rhonchi or rales.  ?Musculoskeletal:  ?   Right lower leg: No edema.  ?   Left lower leg: No edema.  ?Skin: ?   General: Skin is warm and dry.  ?Neurological:  ?   Mental Status: She is alert.  ?Psychiatric:     ?   Speech: Speech normal.     ?   Behavior: Behavior normal.     ?   Thought Content: Thought content normal.  ? ? ?   ?Assessment & Plan:  ? ?Problem List Items Addressed This Visit   ? ?  ? Cardiovascular and Mediastinum  ? Atherosclerosis of aorta ([place])  ?  Chronic, symptomatically stable.  Continue Crestor 5 mg.  LDL less than 100 ?  ?  ?  ? Musculoskeletal and Integument  ? Rash  ?  Resolved.  Continue Pepcid AC '20mg'$ , Zyrtec '5mg'$ .  Advised she may use topical antihistamine for breakthrough itching ?  ?  ? ? ? ?I am having Azani M. Juleen China maintain her Cholecalciferol (VITAMIN D-3 PO), triamcinolone cream, vitamin B-12, ipratropium, gentamicin ointment, clotrimazole-betamethasone, memantine, HYDROcodone-acetaminophen, escitalopram, rosuvastatin, cetirizine, famotidine, and busPIRone. ? ? ?No orders of the defined types were placed in this encounter. ? ? ?Return precautions given.  ? ?Risks, benefits, and alternatives of the medications and treatment plan prescribed today were discussed, and patient expressed understanding.  ? ?Education regarding symptom management and diagnosis given to patient on AVS. ? ?Continue to follow with Burnard Hawthorne, FNP for routine health maintenance.  ? ?[name] and I agreed with plan.  ? ?[name], FNP ? ? ?

## 2021-11-08 DIAGNOSIS — R251 Tremor, unspecified: Secondary | ICD-10-CM | POA: Diagnosis not present

## 2021-11-08 DIAGNOSIS — R2689 Other abnormalities of gait and mobility: Secondary | ICD-10-CM | POA: Diagnosis not present

## 2021-11-08 DIAGNOSIS — G3183 Dementia with Lewy bodies: Secondary | ICD-10-CM | POA: Diagnosis not present

## 2021-11-08 DIAGNOSIS — F22 Delusional disorders: Secondary | ICD-10-CM | POA: Diagnosis not present

## 2021-11-14 DIAGNOSIS — G3183 Dementia with Lewy bodies: Secondary | ICD-10-CM | POA: Diagnosis not present

## 2021-11-14 DIAGNOSIS — F0282 Dementia in other diseases classified elsewhere, unspecified severity, with psychotic disturbance: Secondary | ICD-10-CM | POA: Diagnosis not present

## 2021-11-14 DIAGNOSIS — E785 Hyperlipidemia, unspecified: Secondary | ICD-10-CM | POA: Diagnosis not present

## 2021-11-14 DIAGNOSIS — F32A Depression, unspecified: Secondary | ICD-10-CM | POA: Diagnosis not present

## 2021-11-14 DIAGNOSIS — N189 Chronic kidney disease, unspecified: Secondary | ICD-10-CM | POA: Diagnosis not present

## 2021-11-14 DIAGNOSIS — E538 Deficiency of other specified B group vitamins: Secondary | ICD-10-CM | POA: Diagnosis not present

## 2021-11-14 DIAGNOSIS — I872 Venous insufficiency (chronic) (peripheral): Secondary | ICD-10-CM | POA: Diagnosis not present

## 2021-11-14 DIAGNOSIS — M1990 Unspecified osteoarthritis, unspecified site: Secondary | ICD-10-CM | POA: Diagnosis not present

## 2021-11-14 DIAGNOSIS — G4733 Obstructive sleep apnea (adult) (pediatric): Secondary | ICD-10-CM | POA: Diagnosis not present

## 2021-11-14 DIAGNOSIS — M810 Age-related osteoporosis without current pathological fracture: Secondary | ICD-10-CM | POA: Diagnosis not present

## 2021-11-14 DIAGNOSIS — E079 Disorder of thyroid, unspecified: Secondary | ICD-10-CM | POA: Diagnosis not present

## 2021-11-14 DIAGNOSIS — Z9181 History of falling: Secondary | ICD-10-CM | POA: Diagnosis not present

## 2021-11-15 ENCOUNTER — Other Ambulatory Visit: Payer: Self-pay | Admitting: Psychiatry

## 2021-11-15 DIAGNOSIS — F418 Other specified anxiety disorders: Secondary | ICD-10-CM

## 2021-11-15 DIAGNOSIS — F3342 Major depressive disorder, recurrent, in full remission: Secondary | ICD-10-CM

## 2021-11-22 DIAGNOSIS — I872 Venous insufficiency (chronic) (peripheral): Secondary | ICD-10-CM | POA: Diagnosis not present

## 2021-11-22 DIAGNOSIS — N189 Chronic kidney disease, unspecified: Secondary | ICD-10-CM | POA: Diagnosis not present

## 2021-11-22 DIAGNOSIS — G3183 Dementia with Lewy bodies: Secondary | ICD-10-CM | POA: Diagnosis not present

## 2021-11-22 DIAGNOSIS — F0282 Dementia in other diseases classified elsewhere, unspecified severity, with psychotic disturbance: Secondary | ICD-10-CM | POA: Diagnosis not present

## 2021-12-17 DIAGNOSIS — N1832 Chronic kidney disease, stage 3b: Secondary | ICD-10-CM | POA: Diagnosis not present

## 2021-12-19 DIAGNOSIS — M1990 Unspecified osteoarthritis, unspecified site: Secondary | ICD-10-CM | POA: Diagnosis not present

## 2021-12-19 DIAGNOSIS — M810 Age-related osteoporosis without current pathological fracture: Secondary | ICD-10-CM | POA: Diagnosis not present

## 2021-12-19 DIAGNOSIS — I872 Venous insufficiency (chronic) (peripheral): Secondary | ICD-10-CM | POA: Diagnosis not present

## 2021-12-19 DIAGNOSIS — N189 Chronic kidney disease, unspecified: Secondary | ICD-10-CM | POA: Diagnosis not present

## 2021-12-19 DIAGNOSIS — F0282 Dementia in other diseases classified elsewhere, unspecified severity, with psychotic disturbance: Secondary | ICD-10-CM | POA: Diagnosis not present

## 2021-12-19 DIAGNOSIS — E538 Deficiency of other specified B group vitamins: Secondary | ICD-10-CM | POA: Diagnosis not present

## 2021-12-19 DIAGNOSIS — E785 Hyperlipidemia, unspecified: Secondary | ICD-10-CM | POA: Diagnosis not present

## 2021-12-19 DIAGNOSIS — G3183 Dementia with Lewy bodies: Secondary | ICD-10-CM | POA: Diagnosis not present

## 2021-12-19 DIAGNOSIS — Z9181 History of falling: Secondary | ICD-10-CM | POA: Diagnosis not present

## 2021-12-19 DIAGNOSIS — G4733 Obstructive sleep apnea (adult) (pediatric): Secondary | ICD-10-CM | POA: Diagnosis not present

## 2021-12-19 DIAGNOSIS — E079 Disorder of thyroid, unspecified: Secondary | ICD-10-CM | POA: Diagnosis not present

## 2021-12-19 DIAGNOSIS — F32A Depression, unspecified: Secondary | ICD-10-CM | POA: Diagnosis not present

## 2022-01-03 ENCOUNTER — Ambulatory Visit (INDEPENDENT_AMBULATORY_CARE_PROVIDER_SITE_OTHER): Payer: Medicare Other | Admitting: Psychiatry

## 2022-01-03 ENCOUNTER — Encounter: Payer: Self-pay | Admitting: Psychiatry

## 2022-01-03 VITALS — BP 137/77 | HR 84 | Temp 98.7°F | Wt 175.0 lb

## 2022-01-03 DIAGNOSIS — F02818 Dementia in other diseases classified elsewhere, unspecified severity, with other behavioral disturbance: Secondary | ICD-10-CM

## 2022-01-03 DIAGNOSIS — G3183 Dementia with Lewy bodies: Secondary | ICD-10-CM | POA: Diagnosis not present

## 2022-01-03 DIAGNOSIS — F3342 Major depressive disorder, recurrent, in full remission: Secondary | ICD-10-CM | POA: Diagnosis not present

## 2022-01-03 DIAGNOSIS — F418 Other specified anxiety disorders: Secondary | ICD-10-CM | POA: Diagnosis not present

## 2022-01-03 NOTE — Progress Notes (Signed)
[place] MD OP Progress Note  01/03/2022 9:53 PM [name]  MRN:  [id number]  Chief Complaint:  Chief Complaint  Patient presents with   Follow-up: 88 year old Caucasian female with history of MDD, dementia was evaluated for medication management.   HPI: [name] is a 88 year old Caucasian female who has a history of MDD, dementia ,gastroesophageal reflux disease, obstructive sleep apnea compliant on CPAP, osteoporosis was evaluated in office today.  Patient as well as her daughter - [name] and son- [name] participated in the evaluation today.  Patient attempted to engage and answered questions although in short phrases.  She was noted to be smiling at times and appeared to be [name], oriented to person, place situation.  Patient was able to draw a clock well and was able to put the right time.    Patient reports appetite as fair.Patient does report having trouble swallowing, however was able to verbalize the kind of food she would like to eat like potatoes, soups.  Patient denied any suicidality or perceptual disturbances.  As per daughter and son, patient recently had medication changes per neurology.  She was previously on memantine 5 mg twice a day which was recently readjusted to 5 mg in the morning and 10 mg in the evening.  Since doing so she has been more and more socially withdrawn especially in the afternoon.  When she gets into those episodes she does not respond to the daughter and appears to be zoned out.  Daughter wonders if this could be a side effect of the memantine and is interested in dosage readjustment.  As per daughter she does have problems with swallowing and  has not been eating that well.  However her weight has been unchanged when compared with previous few visits at least since the past 5 to 6 months.She recently had speech therapy.  Denies any other concerns today.      Visit Diagnosis:    ICD-10-CM   1. MDD (major depressive disorder), recurrent, in full  remission ([place])  F33.42     2. Lewy body dementia with behavioral disturbance (HCC)  G31.83    F02.818     3. Other specified anxiety disorders  F41.8    Limited symptom attack      Past Psychiatric History: Reviewed past psychiatric history from progress note on 01/04/2020.  Past Medical History:  Past Medical History:  Diagnosis Date   Anxiety    Depression    GERD (gastroesophageal reflux disease)    Hypercalcemia    Pneumonia    Sepsis ([place])    Sleep apnea    SOB (shortness of breath) on exertion     Past Surgical History:  Procedure Laterality Date   ENDOMETRIAL ABLATION     EYE SURGERY  2011   left cataract   KNEE ARTHROSCOPY Left 05/03/2015   Procedure: left knee arthroscopy, partial medial menisectomy, chondroplasty;  Surgeon: Dereck Leep, MD;  Location: [facility] ORS;  Service: Orthopedics;  Laterality: Left;   TONSILLECTOMY      Family Psychiatric History: Reviewed family psychiatric history from progress note on 01/04/2020.  Family History:  Family History  Problem Relation Age of Onset   Cancer Mother 67       ovarian    Pernicious anemia Mother    COPD Father        nonsmoker   Cancer Cousin        ovarian ca    Social History: Reviewed social history from progress note on  01/04/2020. Social History   Socioeconomic History   Marital status: Widowed    Spouse name: Not on file   Number of children: 5   Years of education: Not on file   Highest education level: Not on file  Occupational History   Occupation: retired  Tobacco Use   Smoking status: Never   Smokeless tobacco: Never  Vaping Use   Vaping Use: Never used  Substance and Sexual Activity   Alcohol use: No   Drug use: No   Sexual activity: Not Currently  Other Topics Concern   Not on file  Social History Narrative   Regular exercise: not really   Caffeine use: some   Daughter lives with her.    Social Determinants of Health   Financial Resource Strain: Low Risk    Difficulty of  Paying Living Expenses: Not hard at all  Food Insecurity: No Food Insecurity   Worried About Charity fundraiser in the Last Year: Never true   [place] in the Last Year: Never true  Transportation Needs: No Transportation Needs   Lack of Transportation (Medical): No   Lack of Transportation (Non-Medical): No  Physical Activity: Not on file  Stress: No Stress Concern Present   Feeling of Stress : Not at all  Social Connections: Moderately Isolated   Frequency of Communication with Friends and Family: Once a week   Frequency of Social Gatherings with Friends and Family: More than three times a week   Attends Religious Services: More than 4 times per year   Active Member of [employer] or Organizations: No   Attends Archivist Meetings: Never   Marital Status: Widowed    Allergies:  Allergies  Allergen Reactions   Codeine Other (See Comments)    Crying spells   Darvon [Propoxyphene Hcl] Other (See Comments)    Crying spells   Propoxyphene Other (See Comments)    Crying, altered mental status    Metabolic Disorder Labs: Lab Results  Component Value Date   HGBA1C 6.0 08/21/2017   Lab Results  Component Value Date   PROLACTIN 11.9 05/12/2020   Lab Results  Component Value Date   CHOL 166 02/19/2021   TRIG 139.0 02/19/2021   HDL 44.40 02/19/2021   CHOLHDL 4 02/19/2021   VLDL 27.8 02/19/2021   LDLCALC 93 02/19/2021   LDLCALC 120 (H) 05/12/2020   Lab Results  Component Value Date   TSH 3.19 09/10/2021   TSH 3.68 05/12/2020    Therapeutic Level Labs: No results found for: LITHIUM No results found for: VALPROATE No components found for:  CBMZ  Current Medications: Current Outpatient Medications  Medication Sig Dispense Refill   busPIRone (BUSPAR) 10 MG tablet Take 2 tablets (20 mg total) by mouth at bedtime. 180 tablet 3   cetirizine (ZYRTEC) 5 MG tablet Take 1 tablet (5 mg total) by mouth daily. 90 tablet 1   Cholecalciferol (VITAMIN D-3 PO) Take 1  capsule by mouth daily. 500iu     clotrimazole-betamethasone (LOTRISONE) cream Apply 1 application topically daily. 30 g 3   escitalopram (LEXAPRO) 10 MG tablet Take 1 tablet (10 mg total) by mouth daily. 90 tablet 3   famotidine (PEPCID) 20 MG tablet Take 1 tablet (20 mg total) by mouth at bedtime. 90 tablet 1   gentamicin ointment (GARAMYCIN) 0.1 % Apply topically 3 (three) times daily.     HYDROcodone-acetaminophen (NORCO) 5-325 MG tablet Take 1 tablet by mouth every 6 (six) hours as needed for  moderate pain. 15 tablet 0   ipratropium (ATROVENT) 0.03 % nasal spray SMARTSIG:1-2 Spray(s) Both Nares 3 Times Daily PRN     memantine (NAMENDA) 5 MG tablet Take 1 tablet by mouth 2 (two) times daily.     rosuvastatin (CRESTOR) 5 MG tablet Take 1 tablet (5 mg total) by mouth every evening. 90 tablet 3   triamcinolone cream (KENALOG) 0.1 % Apply topically 2 (two) times daily. 454 g 1   vitamin B-12 (CYANOCOBALAMIN) 1000 MCG tablet Take by mouth.     No current facility-administered medications for this visit.     Musculoskeletal: Strength & Muscle Tone:  wnl Gait & Station:  uses walker Patient leans: Front  Psychiatric Specialty Exam: Review of Systems  Unable to perform ROS: Dementia   Blood pressure 137/77, pulse 84, temperature 98.7 F (37.1 C), temperature source Temporal, weight 175 lb (79.4 kg).Body mass index is 31 kg/m.  General Appearance: Casual  Eye Contact:  Minimal  Speech:  Slow  Volume:  Normal  Mood:  Euthymic  Affect:  Full Range  Thought Process:  Linear and Descriptions of Associations: Intact  Orientation:  Other:  person, month, situation, place  Thought Content: Logical   Suicidal Thoughts:  No  Homicidal Thoughts:  No  Memory:  Immediate;   Fair Recent;   Poor Remote;   Poor  Judgement:  Fair  Insight:  Fair  Psychomotor Activity:  Decreased  Concentration:  Concentration: Fair and Attention Span: Fair  Recall:  [employer] of Knowledge: Fair  Language:  Fair  Akathisia:  No  Handed:  Right  AIMS (if indicated): done  Assets:  Communication Skills Desire for Improvement Financial Resources/Insurance Social Support  ADL's:  Intact  Cognition: Impaired,  Mild  Sleep:  Fair   Screenings: [place] Office Visit from 01/03/2022 in [place] Office Visit from 06/16/2017 in [facility] Office Visit from 02/07/2016 in [place] Total Score 0 0 [place] Visit from 10/09/2015 in [place]  Total Score (max 30 points ) 27      PHQ2-9    [place] Visit from 01/03/2022 in [place] from 06/29/2021 in [place] Office Visit from 04/02/2021 in [facility] Video Visit from 10/26/2020 in [place] from 06/28/2020 in [place]  PHQ-2 Total Score 0 0 0 1 [place] Visit from 01/03/2022 in [place] Video Visit from 03/23/2021 in [place] Video Visit from 10/26/2020 in [place] No Risk Error: Question 1 not populated No Risk        Assessment and Plan: MEAGHEN VECCHIARELLI is a 88 year old Caucasian female, lives in [place], has a history of MDD, dementia, anxiety disorder was evaluated in office today.  Patient with recent dosage readjustment of her memantine with possible adverse side effects, will benefit from the following plan.  Plan MDD in full remission Lexapro 10 mg p.o. daily  Lewy body dementia-chronic Advised to reduce the memantine back to 5 mg p.o. twice daily.  Patient likely with adverse side effects. Also advised to reach out to neurologist.  Other specified  anxiety-limited symptom attacks-improving BuSpar 10 mg p.o. daily.  Collateral information obtained from  daughter as well as son as noted above.  Discussed dietary modification.  Patient's weight has remained unchanged since the past several months.  Discussed offering her soft food as well as introducing different food.  Follow-up in clinic in 3 months or sooner if needed.  This note was generated in part or whole with voice recognition software. Voice recognition is usually quite accurate but there are transcription errors that can and very often do occur. I apologize for any typographical errors that were not detected and corrected.       [name] Alert, MD 01/03/2022, 9:53 PM

## 2022-01-08 ENCOUNTER — Telehealth: Payer: Self-pay

## 2022-01-08 NOTE — Telephone Encounter (Signed)
Returned call to daughter-Jean. Discussed to contact neurologist for staring off spells. Advised to give more time on the reduced dosage of memantine since daughter felt memantine could have caused the symptoms, started it when she started the higher dosage.  Advised to contact back in a week to 10 days from now for further evaluation.  In the meantime if anything worsens advised to go to the nearest emergency department.

## 2022-01-08 NOTE — Telephone Encounter (Signed)
received a voicemail that [name] is not doing well wants to see if you would call him or his siter and let them know what they should do.

## 2022-03-11 ENCOUNTER — Ambulatory Visit: Payer: Medicare Other | Admitting: Family

## 2022-03-14 ENCOUNTER — Other Ambulatory Visit: Payer: Self-pay | Admitting: Family

## 2022-03-14 DIAGNOSIS — R21 Rash and other nonspecific skin eruption: Secondary | ICD-10-CM

## 2022-03-18 ENCOUNTER — Other Ambulatory Visit: Payer: Self-pay

## 2022-03-18 DIAGNOSIS — R21 Rash and other nonspecific skin eruption: Secondary | ICD-10-CM

## 2022-03-25 ENCOUNTER — Other Ambulatory Visit: Payer: Self-pay

## 2022-03-25 ENCOUNTER — Telehealth (INDEPENDENT_AMBULATORY_CARE_PROVIDER_SITE_OTHER): Payer: Medicare Other | Admitting: Family

## 2022-03-25 ENCOUNTER — Emergency Department: Payer: Medicare Other

## 2022-03-25 ENCOUNTER — Observation Stay
Admission: EM | Admit: 2022-03-25 | Discharge: 2022-03-28 | Disposition: A | Discharge status: Home Health | Payer: Medicare Other | Attending: Internal Medicine | Admitting: Internal Medicine

## 2022-03-25 DIAGNOSIS — R4182 Altered mental status, unspecified: Principal | ICD-10-CM | POA: Insufficient documentation

## 2022-03-25 DIAGNOSIS — E876 Hypokalemia: Secondary | ICD-10-CM | POA: Insufficient documentation

## 2022-03-25 DIAGNOSIS — A419 Sepsis, unspecified organism: Secondary | ICD-10-CM | POA: Diagnosis present

## 2022-03-25 DIAGNOSIS — E785 Hyperlipidemia, unspecified: Secondary | ICD-10-CM | POA: Diagnosis present

## 2022-03-25 DIAGNOSIS — F0280 Dementia in other diseases classified elsewhere without behavioral disturbance: Secondary | ICD-10-CM | POA: Insufficient documentation

## 2022-03-25 DIAGNOSIS — K219 Gastro-esophageal reflux disease without esophagitis: Secondary | ICD-10-CM | POA: Diagnosis present

## 2022-03-25 DIAGNOSIS — A4159 Other Gram-negative sepsis: Secondary | ICD-10-CM | POA: Insufficient documentation

## 2022-03-25 DIAGNOSIS — F03918 Unspecified dementia, unspecified severity, with other behavioral disturbance: Secondary | ICD-10-CM | POA: Diagnosis present

## 2022-03-25 DIAGNOSIS — E663 Overweight: Secondary | ICD-10-CM | POA: Diagnosis present

## 2022-03-25 DIAGNOSIS — F32A Depression, unspecified: Secondary | ICD-10-CM | POA: Diagnosis present

## 2022-03-25 DIAGNOSIS — Z888 Allergy status to other drugs, medicaments and biological substances status: Secondary | ICD-10-CM

## 2022-03-25 DIAGNOSIS — Z8701 Personal history of pneumonia (recurrent): Secondary | ICD-10-CM

## 2022-03-25 DIAGNOSIS — A4150 Gram-negative sepsis, unspecified: Secondary | ICD-10-CM | POA: Diagnosis not present

## 2022-03-25 DIAGNOSIS — N1831 Chronic kidney disease, stage 3a: Secondary | ICD-10-CM | POA: Diagnosis present

## 2022-03-25 DIAGNOSIS — Z20822 Contact with and (suspected) exposure to covid-19: Secondary | ICD-10-CM | POA: Diagnosis present

## 2022-03-25 DIAGNOSIS — R1319 Other dysphagia: Secondary | ICD-10-CM

## 2022-03-25 DIAGNOSIS — G4733 Obstructive sleep apnea (adult) (pediatric): Secondary | ICD-10-CM | POA: Diagnosis not present

## 2022-03-25 DIAGNOSIS — F0154 Vascular dementia, unspecified severity, with anxiety: Secondary | ICD-10-CM | POA: Diagnosis present

## 2022-03-25 DIAGNOSIS — Z885 Allergy status to narcotic agent status: Secondary | ICD-10-CM

## 2022-03-25 DIAGNOSIS — Z79899 Other long term (current) drug therapy: Secondary | ICD-10-CM

## 2022-03-25 DIAGNOSIS — G3183 Dementia with Lewy bodies: Secondary | ICD-10-CM | POA: Insufficient documentation

## 2022-03-25 DIAGNOSIS — N183 Chronic kidney disease, stage 3 unspecified: Secondary | ICD-10-CM | POA: Insufficient documentation

## 2022-03-25 DIAGNOSIS — R1310 Dysphagia, unspecified: Secondary | ICD-10-CM | POA: Insufficient documentation

## 2022-03-25 DIAGNOSIS — N390 Urinary tract infection, site not specified: Secondary | ICD-10-CM | POA: Diagnosis not present

## 2022-03-25 DIAGNOSIS — K08109 Complete loss of teeth, unspecified cause, unspecified class: Secondary | ICD-10-CM | POA: Diagnosis present

## 2022-03-25 DIAGNOSIS — E538 Deficiency of other specified B group vitamins: Secondary | ICD-10-CM | POA: Diagnosis not present

## 2022-03-25 DIAGNOSIS — R933 Abnormal findings on diagnostic imaging of other parts of digestive tract: Secondary | ICD-10-CM | POA: Diagnosis not present

## 2022-03-25 DIAGNOSIS — K449 Diaphragmatic hernia without obstruction or gangrene: Secondary | ICD-10-CM | POA: Diagnosis not present

## 2022-03-25 DIAGNOSIS — E782 Mixed hyperlipidemia: Secondary | ICD-10-CM | POA: Diagnosis not present

## 2022-03-25 DIAGNOSIS — R399 Unspecified symptoms and signs involving the genitourinary system: Secondary | ICD-10-CM

## 2022-03-25 DIAGNOSIS — E860 Dehydration: Secondary | ICD-10-CM | POA: Diagnosis not present

## 2022-03-25 DIAGNOSIS — K222 Esophageal obstruction: Secondary | ICD-10-CM | POA: Diagnosis not present

## 2022-03-25 DIAGNOSIS — F329 Major depressive disorder, single episode, unspecified: Secondary | ICD-10-CM | POA: Diagnosis present

## 2022-03-25 DIAGNOSIS — F0153 Vascular dementia, unspecified severity, with mood disturbance: Secondary | ICD-10-CM | POA: Diagnosis present

## 2022-03-25 DIAGNOSIS — G309 Alzheimer's disease, unspecified: Secondary | ICD-10-CM | POA: Diagnosis present

## 2022-03-25 DIAGNOSIS — Z6829 Body mass index (BMI) 29.0-29.9, adult: Secondary | ICD-10-CM

## 2022-03-25 DIAGNOSIS — F419 Anxiety disorder, unspecified: Secondary | ICD-10-CM | POA: Diagnosis not present

## 2022-03-25 DIAGNOSIS — F0284 Dementia in other diseases classified elsewhere, unspecified severity, with anxiety: Secondary | ICD-10-CM | POA: Diagnosis present

## 2022-03-25 DIAGNOSIS — F0283 Dementia in other diseases classified elsewhere, unspecified severity, with mood disturbance: Secondary | ICD-10-CM | POA: Diagnosis present

## 2022-03-25 LAB — COMPREHENSIVE METABOLIC PANEL
ALT: 12 U/L (ref 0–44)
AST: 19 U/L (ref 15–41)
Albumin: 4.2 g/dL (ref 3.5–5.0)
Alkaline Phosphatase: 62 U/L (ref 38–126)
Anion gap: 10 (ref 5–15)
BUN: 15 mg/dL (ref 8–23)
CO2: 25 mmol/L (ref 22–32)
Calcium: 10.5 mg/dL — ABNORMAL HIGH (ref 8.9–10.3)
Chloride: 103 mmol/L (ref 98–111)
Creatinine, Ser: 1.08 mg/dL — ABNORMAL HIGH (ref 0.44–1.00)
GFR, Estimated: 49 mL/min — ABNORMAL LOW (ref >60)
Glucose, Bld: 102 mg/dL — ABNORMAL HIGH (ref 70–99)
Potassium: 3.4 mmol/L — ABNORMAL LOW (ref 3.5–5.1)
Sodium: 138 mmol/L (ref 135–145)
Total Bilirubin: 1.2 mg/dL (ref 0.3–1.2)
Total Protein: 7.8 g/dL (ref 6.5–8.1)

## 2022-03-25 LAB — CBC WITH DIFFERENTIAL/PLATELET
Abs Immature Granulocytes: 0.02 10*3/uL (ref 0.00–0.07)
Basophils Absolute: 0.1 10*3/uL (ref 0.0–0.1)
Basophils Relative: 1 %
Eosinophils Absolute: 0 10*3/uL (ref 0.0–0.5)
Eosinophils Relative: 0 %
HCT: 50.9 % — ABNORMAL HIGH (ref 36.0–46.0)
Hemoglobin: 16.2 g/dL — ABNORMAL HIGH (ref 12.0–15.0)
Immature Granulocytes: 0 %
Lymphocytes Relative: 26 %
Lymphs Abs: 1.7 10*3/uL (ref 0.7–4.0)
MCH: 29.5 pg (ref 26.0–34.0)
MCHC: 31.8 g/dL (ref 30.0–36.0)
MCV: 92.5 fL (ref 80.0–100.0)
Monocytes Absolute: 0.5 10*3/uL (ref 0.1–1.0)
Monocytes Relative: 7 %
Neutro Abs: 4.4 10*3/uL (ref 1.7–7.7)
Neutrophils Relative %: 66 %
Platelets: 219 10*3/uL (ref 150–400)
RBC: 5.5 MIL/uL — ABNORMAL HIGH (ref 3.87–5.11)
RDW: 13.8 % (ref 11.5–15.5)
WBC: 6.6 10*3/uL (ref 4.0–10.5)
nRBC: 0 % (ref 0.0–0.2)

## 2022-03-25 LAB — RESP PANEL BY RT-PCR (FLU A&B, COVID) ARPGX2
Influenza A by PCR: NEGATIVE
Influenza B by PCR: NEGATIVE
SARS Coronavirus 2 by RT PCR: NEGATIVE

## 2022-03-25 LAB — LACTIC ACID, PLASMA: Lactic Acid, Venous: 1.1 mmol/L (ref 0.5–1.9)

## 2022-03-25 LAB — TROPONIN I (HIGH SENSITIVITY)
Troponin I (High Sensitivity): 6 ng/L (ref <18)
Troponin I (High Sensitivity): 6 ng/L (ref <18)

## 2022-03-25 NOTE — Progress Notes (Addendum)
Virtual Visit via Video Note  I connected with@  on 03/27/22 at  4:00 PM EDT by a video enabled telemedicine application and verified that I am speaking with the correct person using two identifiers.  Location patient: home Location provider:work  Persons participating in the virtual visit: patient, provider  I discussed the limitations of evaluation and management by telemedicine and the availability of in person appointments. The patient expressed understanding and agreed to proceed.  Interactive audio and video telecommunications were attempted between this provider and patient, however failed, due to patient having technical difficulties or patient did not have access to video capability.  We continued and completed visit with audio only.   HPI: Accompanied by daughter , [name], who is primary historian.  I can hear patient in the background.  Concern today for altered mental status and UTI [name] states her mother is not speaking and appearing 'more confused'. Daughter feels this has been an acute change beyond her baseline.   She is less cooperative and 'zoning out. ' Sleeping well.   No fever, chills, abdominal pain, rash, congestion, ha, facial drooping.   She also complains of trouble taking pills and dysphagia.  History of esophageal stricture  [facility] test [facility] shows positive for leukocytes.   No recent UTI.   Follows with Dr. Shea Evans for major depressive disorder, last seen 01/03/2022 whom prescribes Lexapro 10 mg daily, BuSpar 10 mg daily.  Previously following with neurology, most recently on memantine 5 mg in the morning, 10 mg in the evening.  After an appointment with Dr. Shea Evans this was reduced to memantine 5 mg twice daily. Follow-up with neurology 05/10/22   Ct head 07/2020 no acute intracranial abnormality  ROS: See pertinent positives and negatives per HPI.    EXAM:  VITALS per patient if applicable: There were no vitals taken for this visit. BP Readings  from Last 3 Encounters:  03/27/22 129/89  11/02/21 118/68  09/10/21 123/65   Wt Readings from Last 3 Encounters:  03/27/22 168 lb (76.2 kg)  11/02/21 177 lb 9.6 oz (80.6 kg)  09/10/21 176 lb 12.8 oz (80.2 kg)    ASSESSMENT AND PLAN:  Discussed the following assessment and plan:  Problem List Items Addressed This Visit       Other   AMS (altered mental status)    Unfortunately unable to see patient on video today.  We essentially had a telephone call .  Expressed my concern with altered mental status and not being safe nor appropriate to evaluate over telephone call especially with patient comorbidities.  She would need emergency evaluation for infection, rule out CVA as well as evaluated dysphagia. Anticipate likely complicated by underlying dementia.  Daughter was very agreeable to going to ED at [facility] today.  I called to give nurse triage report.      UTI symptoms - Primary   Relevant Orders   Urinalysis, Routine w reflex microscopic   Urine Culture    -we discussed possible serious and likely etiologies, options for evaluation and workup, limitations of telemedicine visit vs in person visit, treatment, treatment risks and precautions. Pt prefers to treat via telemedicine empirically rather then risking or undertaking an in person visit at this moment.  .   I discussed the assessment and treatment plan with the patient. The patient was provided an opportunity to ask questions and all were answered. The patient agreed with the plan and demonstrated an understanding of the instructions.   The patient was advised to  call back or seek an in-person evaluation if the symptoms worsen or if the condition fails to improve as anticipated.  I spent 18 minutes with this patient on the phone.  [name], FNP

## 2022-03-25 NOTE — Patient Instructions (Signed)
Advised patient to go immediately to closest emergency department at [facility].

## 2022-03-25 NOTE — ED Provider Triage Note (Signed)
Emergency Medicine Provider Triage Evaluation Note  [name] , a 88 y.o. female  was evaluated in triage.  Pt complains of AMS. Daughter reports that she has lewy-body dementia. Has problems with her esophagus (has required esophageal dilatations) so she stopped eating and drinking and taking her medications 1.5 weeks ago. Began to have delusions which she has occasionally. Daughter reports that this happened when she had a UTI. Daughter also reports that she is stuttering and having trouble getting her thoughts out for 2 weeks. Also is not going to the bathroom unless daughter reminds her to. Lives with daughter.   Review of Systems  Positive: Mental status change Negative: pain  Physical Exam  There were no vitals taken for this visit. Gen:   Awake, no distress   Resp:  Normal effort  MSK:   Moves extremities without difficulty  Other:    Medical Decision Making  Medically screening exam initiated at 6:57 PM.  Appropriate orders placed.  TARISSA KERIN was informed that the remainder of the evaluation will be completed by another provider, this initial triage assessment does not replace that evaluation, and the importance of remaining in the ED until their evaluation is complete.     Emeline Gins 03/25/22 1910

## 2022-03-25 NOTE — ED Triage Notes (Addendum)
Pt to ED via POV from home with family due to AMS. Family states pt has been having trouble swallowing and has been choking on food. Pt has been having more visual hallucinations. Daughter states behavior reminds her of UTI. Family states pt is normally able to care for ADLs but recently has been having increased confusion, difficulty finding words, incontinence and weakness. Family states have noticed changes over the last week.

## 2022-03-26 ENCOUNTER — Inpatient Hospital Stay: Payer: Medicare Other

## 2022-03-26 DIAGNOSIS — E663 Overweight: Secondary | ICD-10-CM | POA: Diagnosis present

## 2022-03-26 DIAGNOSIS — A419 Sepsis, unspecified organism: Secondary | ICD-10-CM | POA: Diagnosis present

## 2022-03-26 DIAGNOSIS — Z885 Allergy status to narcotic agent status: Secondary | ICD-10-CM | POA: Diagnosis not present

## 2022-03-26 DIAGNOSIS — E876 Hypokalemia: Secondary | ICD-10-CM

## 2022-03-26 DIAGNOSIS — K222 Esophageal obstruction: Secondary | ICD-10-CM | POA: Diagnosis not present

## 2022-03-26 DIAGNOSIS — K219 Gastro-esophageal reflux disease without esophagitis: Secondary | ICD-10-CM | POA: Diagnosis not present

## 2022-03-26 DIAGNOSIS — E785 Hyperlipidemia, unspecified: Secondary | ICD-10-CM

## 2022-03-26 DIAGNOSIS — G309 Alzheimer's disease, unspecified: Secondary | ICD-10-CM | POA: Diagnosis present

## 2022-03-26 DIAGNOSIS — A4150 Gram-negative sepsis, unspecified: Secondary | ICD-10-CM

## 2022-03-26 DIAGNOSIS — F03918 Unspecified dementia, unspecified severity, with other behavioral disturbance: Secondary | ICD-10-CM | POA: Diagnosis not present

## 2022-03-26 DIAGNOSIS — F0154 Vascular dementia, unspecified severity, with anxiety: Secondary | ICD-10-CM | POA: Diagnosis present

## 2022-03-26 DIAGNOSIS — Z6829 Body mass index (BMI) 29.0-29.9, adult: Secondary | ICD-10-CM | POA: Diagnosis not present

## 2022-03-26 DIAGNOSIS — E860 Dehydration: Secondary | ICD-10-CM | POA: Diagnosis present

## 2022-03-26 DIAGNOSIS — R1319 Other dysphagia: Secondary | ICD-10-CM | POA: Diagnosis not present

## 2022-03-26 DIAGNOSIS — F418 Other specified anxiety disorders: Secondary | ICD-10-CM | POA: Diagnosis not present

## 2022-03-26 DIAGNOSIS — G4730 Sleep apnea, unspecified: Secondary | ICD-10-CM | POA: Diagnosis not present

## 2022-03-26 DIAGNOSIS — R1310 Dysphagia, unspecified: Secondary | ICD-10-CM | POA: Diagnosis not present

## 2022-03-26 DIAGNOSIS — E538 Deficiency of other specified B group vitamins: Secondary | ICD-10-CM | POA: Diagnosis present

## 2022-03-26 DIAGNOSIS — Z8701 Personal history of pneumonia (recurrent): Secondary | ICD-10-CM | POA: Diagnosis not present

## 2022-03-26 DIAGNOSIS — F419 Anxiety disorder, unspecified: Secondary | ICD-10-CM | POA: Diagnosis not present

## 2022-03-26 DIAGNOSIS — K449 Diaphragmatic hernia without obstruction or gangrene: Secondary | ICD-10-CM | POA: Diagnosis not present

## 2022-03-26 DIAGNOSIS — N390 Urinary tract infection, site not specified: Secondary | ICD-10-CM | POA: Diagnosis present

## 2022-03-26 DIAGNOSIS — N1831 Chronic kidney disease, stage 3a: Secondary | ICD-10-CM | POA: Diagnosis present

## 2022-03-26 DIAGNOSIS — Z20822 Contact with and (suspected) exposure to covid-19: Secondary | ICD-10-CM | POA: Diagnosis present

## 2022-03-26 DIAGNOSIS — F0153 Vascular dementia, unspecified severity, with mood disturbance: Secondary | ICD-10-CM | POA: Diagnosis present

## 2022-03-26 DIAGNOSIS — G4733 Obstructive sleep apnea (adult) (pediatric): Secondary | ICD-10-CM | POA: Diagnosis present

## 2022-03-26 DIAGNOSIS — Z79899 Other long term (current) drug therapy: Secondary | ICD-10-CM | POA: Diagnosis not present

## 2022-03-26 DIAGNOSIS — F0284 Dementia in other diseases classified elsewhere, unspecified severity, with anxiety: Secondary | ICD-10-CM | POA: Diagnosis present

## 2022-03-26 DIAGNOSIS — K08109 Complete loss of teeth, unspecified cause, unspecified class: Secondary | ICD-10-CM | POA: Diagnosis present

## 2022-03-26 DIAGNOSIS — F0283 Dementia in other diseases classified elsewhere, unspecified severity, with mood disturbance: Secondary | ICD-10-CM | POA: Diagnosis present

## 2022-03-26 DIAGNOSIS — F329 Major depressive disorder, single episode, unspecified: Secondary | ICD-10-CM | POA: Diagnosis present

## 2022-03-26 DIAGNOSIS — F32A Depression, unspecified: Secondary | ICD-10-CM | POA: Diagnosis present

## 2022-03-26 LAB — CBC
HCT: 46.4 % — ABNORMAL HIGH (ref 36.0–46.0)
Hemoglobin: 14.9 g/dL (ref 12.0–15.0)
MCH: 29 pg (ref 26.0–34.0)
MCHC: 32.1 g/dL (ref 30.0–36.0)
MCV: 90.4 fL (ref 80.0–100.0)
Platelets: 190 10*3/uL (ref 150–400)
RBC: 5.13 MIL/uL — ABNORMAL HIGH (ref 3.87–5.11)
RDW: 13.7 % (ref 11.5–15.5)
WBC: 6 10*3/uL (ref 4.0–10.5)
nRBC: 0 % (ref 0.0–0.2)

## 2022-03-26 LAB — URINALYSIS, ROUTINE W REFLEX MICROSCOPIC
Bacteria, UA: NONE SEEN
Bilirubin Urine: NEGATIVE
Glucose, UA: NEGATIVE mg/dL
Ketones, ur: 20 mg/dL — AB
Leukocytes,Ua: NEGATIVE
Nitrite: NEGATIVE
Protein, ur: 30 mg/dL — AB
Specific Gravity, Urine: 1.027 (ref 1.005–1.030)
pH: 5 (ref 5.0–8.0)

## 2022-03-26 LAB — BASIC METABOLIC PANEL
Anion gap: 9 (ref 5–15)
BUN: 12 mg/dL (ref 8–23)
CO2: 24 mmol/L (ref 22–32)
Calcium: 9.4 mg/dL (ref 8.9–10.3)
Chloride: 108 mmol/L (ref 98–111)
Creatinine, Ser: 0.9 mg/dL (ref 0.44–1.00)
GFR, Estimated: >60 mL/min (ref >60)
Glucose, Bld: 91 mg/dL (ref 70–99)
Potassium: 3.7 mmol/L (ref 3.5–5.1)
Sodium: 141 mmol/L (ref 135–145)

## 2022-03-26 LAB — MAGNESIUM: Magnesium: 1.9 mg/dL (ref 1.7–2.4)

## 2022-03-26 MED ORDER — SODIUM CHLORIDE 0.9 % IV SOLN: INTRAVENOUS | Status: DC

## 2022-03-26 MED ORDER — LACTATED RINGERS IV BOLUS
1000.0000 mL | Freq: Once | INTRAVENOUS | Status: AC
  Administered 2022-03-26: 1000 mL via INTRAVENOUS

## 2022-03-26 MED ORDER — BUSPIRONE HCL 10 MG PO TABS
20.0000 mg | ORAL_TABLET | Freq: Every day | ORAL | Status: DC
  Administered 2022-03-27: 20 mg via ORAL
  Filled 2022-03-26: qty 2

## 2022-03-26 MED ORDER — MAGNESIUM HYDROXIDE 400 MG/5ML PO SUSP: 30.0000 mL | Freq: Every day | ORAL | Status: DC | PRN

## 2022-03-26 MED ORDER — ROSUVASTATIN CALCIUM 10 MG PO TABS
5.0000 mg | ORAL_TABLET | Freq: Every evening | ORAL | Status: DC
  Administered 2022-03-27: 5 mg via ORAL
  Filled 2022-03-26: qty 1

## 2022-03-26 MED ORDER — ENOXAPARIN SODIUM 40 MG/0.4ML IJ SOSY
0.5000 mg/kg | PREFILLED_SYRINGE | INTRAMUSCULAR | Status: DC
  Administered 2022-03-26 – 2022-03-28 (×2): 40 mg via SUBCUTANEOUS
  Filled 2022-03-26 (×2): qty 0.4

## 2022-03-26 MED ORDER — TRAZODONE HCL 50 MG PO TABS
25.0000 mg | ORAL_TABLET | Freq: Every evening | ORAL | Status: DC | PRN
  Administered 2022-03-27: 25 mg via ORAL
  Filled 2022-03-26: qty 1

## 2022-03-26 MED ORDER — SODIUM CHLORIDE 0.9 % IV SOLN
1.0000 g | INTRAVENOUS | Status: DC
  Administered 2022-03-26 – 2022-03-28 (×3): 1 g via INTRAVENOUS
  Filled 2022-03-26 (×2): qty 10
  Filled 2022-03-26: qty 1

## 2022-03-26 MED ORDER — VITAMIN B-12 1000 MCG PO TABS
1000.0000 ug | ORAL_TABLET | Freq: Every day | ORAL | Status: DC
  Administered 2022-03-27 – 2022-03-28 (×2): 1000 ug via ORAL
  Filled 2022-03-26 (×3): qty 1

## 2022-03-26 MED ORDER — POTASSIUM CHLORIDE IN NACL 20-0.9 MEQ/L-% IV SOLN
INTRAVENOUS | Status: DC
  Filled 2022-03-26 (×2): qty 1000

## 2022-03-26 MED ORDER — LORATADINE 10 MG PO TABS
10.0000 mg | ORAL_TABLET | Freq: Every day | ORAL | Status: DC
  Administered 2022-03-27 – 2022-03-28 (×2): 10 mg via ORAL
  Filled 2022-03-26 (×2): qty 1

## 2022-03-26 MED ORDER — ONDANSETRON HCL 4 MG/2ML IJ SOLN: 4.0000 mg | Freq: Four times a day (QID) | INTRAMUSCULAR | Status: DC | PRN

## 2022-03-26 MED ORDER — MEMANTINE HCL 5 MG PO TABS
5.0000 mg | ORAL_TABLET | Freq: Two times a day (BID) | ORAL | Status: DC
  Administered 2022-03-27 – 2022-03-28 (×3): 5 mg via ORAL
  Filled 2022-03-26 (×3): qty 1

## 2022-03-26 MED ORDER — ONDANSETRON HCL 4 MG PO TABS: 4.0000 mg | ORAL_TABLET | Freq: Four times a day (QID) | ORAL | Status: DC | PRN

## 2022-03-26 MED ORDER — ESCITALOPRAM OXALATE 10 MG PO TABS
10.0000 mg | ORAL_TABLET | Freq: Every day | ORAL | Status: DC
  Administered 2022-03-27 – 2022-03-28 (×2): 10 mg via ORAL
  Filled 2022-03-26 (×2): qty 1

## 2022-03-26 MED ORDER — POTASSIUM CHLORIDE 20 MEQ PO PACK: 40.0000 meq | PACK | Freq: Once | ORAL | Status: DC

## 2022-03-26 MED ORDER — VITAMIN D 25 MCG (1000 UNIT) PO TABS
1000.0000 ug | ORAL_TABLET | Freq: Every day | ORAL | Status: DC
  Administered 2022-03-27 – 2022-03-28 (×2): 1000 ug via ORAL
  Filled 2022-03-26: qty 1
  Filled 2022-03-26: qty 4
  Filled 2022-03-26: qty 1

## 2022-03-26 MED ORDER — PANTOPRAZOLE SODIUM 40 MG IV SOLR
40.0000 mg | Freq: Every day | INTRAVENOUS | Status: DC
  Administered 2022-03-26 – 2022-03-28 (×3): 40 mg via INTRAVENOUS
  Filled 2022-03-26 (×3): qty 10

## 2022-03-26 MED ORDER — FAMOTIDINE 20 MG PO TABS
20.0000 mg | ORAL_TABLET | Freq: Every day | ORAL | Status: DC
  Administered 2022-03-27: 20 mg via ORAL
  Filled 2022-03-26: qty 1

## 2022-03-26 MED ORDER — ACETAMINOPHEN 650 MG RE SUPP: 650.0000 mg | Freq: Four times a day (QID) | RECTAL | Status: DC | PRN

## 2022-03-26 MED ORDER — ACETAMINOPHEN 325 MG PO TABS: 650.0000 mg | ORAL_TABLET | Freq: Four times a day (QID) | ORAL | Status: DC | PRN

## 2022-03-26 NOTE — Assessment & Plan Note (Addendum)
Secondary to esophageal stricture.  Plus underlying dementia.  Patient seen by speech therapy who recommended a dysphagia 2 diet with minced food and thin liquids.  Speech therapy through home health will continue to follow patient.

## 2022-03-26 NOTE — Assessment & Plan Note (Signed)
-   We will continue BuSpar and Lexapro.

## 2022-03-26 NOTE — ED Notes (Signed)
Patient moved into hospital bed at this time. Visitor at bedside. Bed locked and lowered. Respirations even and unlabored. NAD noted.

## 2022-03-26 NOTE — Assessment & Plan Note (Signed)
-   We will continue vitamin B12. 

## 2022-03-26 NOTE — Assessment & Plan Note (Signed)
-   We will continue Namenda and utilize as needed Haldol.

## 2022-03-26 NOTE — Consult Note (Signed)
Consultation  Referring Provider:     Dr Sidney Ace Admit date 03/25/22 Consult date        03/26/22 Reason for Consultation:     Dysphagia         HPI:   [name] is a 88 y.o. female medical history significant for vascular and Alzheimer's dementia, anxiety, depression, stage IIIa CKD, GERD, obstructive sleep apnea and hypocalcemia, who presented to ER with acute onset of altered mental status with confusion - it was also noted she was having some problems wihth spitting up foods over the last week. Speech has been consulted. There is a report of a history of an esophageal stricture in past. Barium swallow has been ordered for today. She was given a dose of rocephin for concern of UTi and had an unremarkable nonconrasted head CT. She is not anemic. GFR 49. Liver enzymes unremarkable. Chest xray unremarkable. Utine culture pending Daughter is with her who reports patient has had some advancing dementia- started a week or so ago with some progressive confusion, difficulty with attention and then a couple days later with difficulty eating and taking medications- spit up food frequently and was unable to swallow well. She has had no abdominal pain. Did have some problems with diarrhea after hospitalization some years ago but this has improved. They deny any problems with abdominal pain, weight loss and further GI concerns.   PREVIOUS ENDOSCOPIES:             Colonsocopy- declined 2013 EGD- >20y ago  Past Medical History:  Diagnosis Date   Anxiety    Depression    GERD (gastroesophageal reflux disease)    Hypercalcemia    Pneumonia    Sepsis ([place])    Sleep apnea    SOB (shortness of breath) on exertion     Past Surgical History:  Procedure Laterality Date   ENDOMETRIAL ABLATION     EYE SURGERY  2011   left cataract   KNEE ARTHROSCOPY Left 05/03/2015   Procedure: left knee arthroscopy, partial medial menisectomy, chondroplasty;  Surgeon: Dereck Leep, MD;  Location: [facility] ORS;  Service:  Orthopedics;  Laterality: Left;   TONSILLECTOMY      Family History  Problem Relation Age of Onset   Cancer Mother 11       ovarian    Pernicious anemia Mother    COPD Father        nonsmoker   Cancer Cousin        ovarian ca     Social History   Tobacco Use   Smoking status: Never   Smokeless tobacco: Never  Vaping Use   Vaping Use: Never used  Substance Use Topics   Alcohol use: No   Drug use: No    Prior to Admission medications   Medication Sig Start Date End Date Taking? Authorizing Provider  busPIRone (BUSPAR) 10 MG tablet Take 2 tablets (20 mg total) by mouth at bedtime. 09/21/21  Yes Ursula Alert, MD  cetirizine (ZYRTEC) 5 MG tablet TAKE 1 TABLET(5 MG) BY MOUTH DAILY 03/14/22  Yes Arnett, Yvetta Coder, FNP  Cholecalciferol (VITAMIN D-3 PO) Take 1 capsule by mouth daily. 500iu   Yes [provider]  escitalopram (LEXAPRO) 10 MG tablet Take 1 tablet (10 mg total) by mouth daily. 06/22/21  Yes Ursula Alert, MD  famotidine (PEPCID) 20 MG tablet TAKE 1 TABLET(20 MG) BY MOUTH AT BEDTIME 03/15/22  Yes Burnard Hawthorne, FNP  memantine (NAMENDA) 5 MG tablet Take 5 mg  by mouth 2 (two) times daily.   Yes [provider]  rosuvastatin (CRESTOR) 5 MG tablet Take 1 tablet (5 mg total) by mouth every evening. 07/10/21  Yes Arnett, Yvetta Coder, FNP  vitamin B-12 (CYANOCOBALAMIN) 1000 MCG tablet Take 1,000 mcg by mouth daily.   Yes [provider]    Current Facility-Administered Medications  Medication Dose Route Frequency Provider Last Rate Last Admin   0.9 % NaCl with KCl 20 mEq/ L  infusion   Intravenous Continuous Mansy, Jan A, MD 100 mL/hr at 03/26/22 0636 New Bag at 03/26/22 0636   acetaminophen (TYLENOL) tablet 650 mg  650 mg Oral Q6H PRN Mansy, Jan A, MD       Or   acetaminophen (TYLENOL) suppository 650 mg  650 mg Rectal Q6H PRN Mansy, Jan A, MD       busPIRone (BUSPAR) tablet 20 mg  20 mg Oral QHS Mansy, Jan A, MD       cefTRIAXone (ROCEPHIN)  1 g in sodium chloride 0.9 % 100 mL IVPB  1 g Intravenous Q24H Mansy, Jan A, MD   Stopped at 03/26/22 0537   cholecalciferol (VITAMIN D3) 25 MCG (1000 UNIT) tablet 1,000 mcg  1,000 mcg Oral Daily Mansy, Jan A, MD       cyanocobalamin (VITAMIN B12) tablet 1,000 mcg  1,000 mcg Oral Daily Mansy, Jan A, MD       enoxaparin (LOVENOX) injection 40 mg  0.5 mg/kg Subcutaneous Q24H Mansy, Jan A, MD       escitalopram (LEXAPRO) tablet 10 mg  10 mg Oral Daily Mansy, Jan A, MD       famotidine (PEPCID) tablet 20 mg  20 mg Oral QHS Mansy, Jan A, MD       loratadine (CLARITIN) tablet 10 mg  10 mg Oral Daily Mansy, Jan A, MD       magnesium hydroxide (MILK OF MAGNESIA) suspension 30 mL  30 mL Oral Daily PRN Mansy, Jan A, MD       memantine [facility]) tablet 5 mg  5 mg Oral BID Mansy, Jan A, MD       ondansetron [facility]) tablet 4 mg  4 mg Oral Q6H PRN Mansy, Jan A, MD       Or   ondansetron [facility]) injection 4 mg  4 mg Intravenous Q6H PRN Mansy, Jan A, MD       potassium chloride (KLOR-CON) packet 40 mEq  40 mEq Oral Once Mansy, Jan A, MD       rosuvastatin (CRESTOR) tablet 5 mg  5 mg Oral QPM Mansy, Jan A, MD       traZODone (DESYREL) tablet 25 mg  25 mg Oral QHS PRN Mansy, Arvella Merles, MD       Current Outpatient Medications  Medication Sig Dispense Refill   busPIRone (BUSPAR) 10 MG tablet Take 2 tablets (20 mg total) by mouth at bedtime. 180 tablet 3   cetirizine (ZYRTEC) 5 MG tablet TAKE 1 TABLET(5 MG) BY MOUTH DAILY 90 tablet 1   Cholecalciferol (VITAMIN D-3 PO) Take 1 capsule by mouth daily. 500iu     escitalopram (LEXAPRO) 10 MG tablet Take 1 tablet (10 mg total) by mouth daily. 90 tablet 3   famotidine (PEPCID) 20 MG tablet TAKE 1 TABLET(20 MG) BY MOUTH AT BEDTIME 90 tablet 1   memantine (NAMENDA) 5 MG tablet Take 5 mg by mouth 2 (two) times daily.     rosuvastatin (CRESTOR) 5 MG tablet Take 1 tablet (5  mg total) by mouth every evening. 90 tablet 3   vitamin B-12 (CYANOCOBALAMIN) 1000 MCG tablet Take  1,000 mcg by mouth daily.      Allergies as of 03/25/2022 - Review Complete 03/25/2022  Allergen Reaction Noted   Codeine Other (See Comments) 06/29/2012   Darvon [propoxyphene hcl] Other (See Comments) 06/29/2012   Propoxyphene Other (See Comments) 10/09/2015     Review of Systems:    All systems reviewed and negative except where noted in HPI.    Physical Exam:  Vital signs in last 24 hours: Temp:  [97.9 F (36.6 C)-98.6 F (37 C)] 97.9 F (36.6 C) (08/15 0500) Pulse Rate:  [82-94] 93 (08/15 0625) Resp:  [12-23] 21 (08/15 0625) BP: (138-169)/(55-79) 138/79 (08/15 0625) SpO2:  [90 %-100 %] 100 % (08/15 0625) Weight:  [81 kg] 81 kg (08/15 0146)   General:   Pleasant woman in NAD Head:  Normocephalic and atraumatic. Eyes:   No icterus.   Conjunctiva pink. Ears:  Normal auditory acuity. Mouth: Mucosa pink moist, no lesions. Neck:  Supple; no masses felt Lungs:  Respirations even and unlabored. Lungs clear to auscultation bilaterally.   No wheezes, crackles, or rhonchi.  Heart:  S1S2, RRR, no MRG. Trace ankle edema. Abdomen:   Flat, soft, nondistended, nontender. Normal bowel sounds. No appreciable masses or hepatomegaly. No rebound signs or other peritoneal signs. Rectal:  Not performed.  Msk:  MAEW x4, No clubbing or cyanosis. Strength 5/5. Symmetrical without gross deformities. Neurologic:  Alert and  oriented x3 and to familiar people but limited recall. Cranial nerves II-XII intact.  Skin:  Warm, dry, pink without significant lesions or rashes. Psych:  Alert and cooperative. Normal affect.  LAB RESULTS: Recent Labs    03/25/22 1909  WBC 6.6  HGB 16.2*  HCT 50.9*  PLT 219   BMET Recent Labs    03/25/22 1909  NA 138  K 3.4*  CL 103  CO2 25  GLUCOSE 102*  BUN 15  CREATININE 1.08*  CALCIUM 10.5*   LFT Recent Labs    03/25/22 1909  PROT 7.8  ALBUMIN 4.2  AST 19  ALT 12  ALKPHOS 62  BILITOT 1.2   PT/INR No results for input(s): "LABPROT", "INR"  in the last 72 hours.  STUDIES: CT Head Wo Contrast  Result Date: 03/25/2022 CLINICAL DATA:  Mental status change, unknown cause EXAM: CT HEAD WITHOUT CONTRAST TECHNIQUE: Contiguous axial images were obtained from the base of the skull through the vertex without intravenous contrast. RADIATION DOSE REDUCTION: This exam was performed according to the departmental dose-optimization program which includes automated exposure control, adjustment of the mA and/or kV according to patient size and/or use of iterative reconstruction technique. COMPARISON:  CT head 07/20/2020 BRAIN: BRAIN Cerebral ventricle sizes are concordant with the degree of cerebral volume loss-frontal lobe atrophy predominant again noted. Patchy and confluent areas of decreased attenuation are noted throughout the deep and periventricular white matter of the cerebral hemispheres bilaterally, compatible with chronic microvascular ischemic disease. No evidence of large-territorial acute infarction. No parenchymal hemorrhage. No mass lesion. No extra-axial collection. No mass effect or midline shift. No hydrocephalus. Basilar cisterns are patent. Vascular: No hyperdense vessel. Atherosclerotic calcifications are present within the cavernous internal carotid arteries. Skull: No acute fracture or focal lesion. Sinuses/Orbits: Paranasal sinuses and mastoid air cells are clear. Left lens replacement. Otherwise the orbits are unremarkable. Other: None. IMPRESSION: No acute intracranial abnormality. Electronically Signed   By: Iven Finn M.D.   On:  03/25/2022 19:48   DG Chest 2 View  Result Date: 03/25/2022 CLINICAL DATA:  Altered mental status EXAM: CHEST - 2 VIEW COMPARISON:  05/30/2016 FINDINGS: Cardiac shadow is within normal limits. Aortic calcifications are again noted. Lungs are clear bilaterally. Mild degenerative changes of the thoracic spine are noted. IMPRESSION: No acute abnormality noted. Electronically Signed   By: Inez Catalina M.D.    On: 03/25/2022 19:43       Impression / Plan:   Dysphagia- acute onset in setting of current illness and advancing dementia.History of esophageal stenosis >20y y ago dilated out of state.  Proceed with barium swallow. Agree with speech therapy. Consider egd as clinically indicated- would like to be sure benefit outweighs risk prior to proceeding.  Thank you very much for this consult. These services were provided by Stephens November, NP-C, in collaboration with Lesly Rubenstein, MD, with whom I have discussed this patient in full.   Stephens November, NP-C

## 2022-03-26 NOTE — Progress Notes (Signed)
SLP Cancellation Note  Patient Details Name: [name] MRN: [id number] DOB: [birth year]   Cancelled treatment:       Reason Eval/Treat Not Completed: Medical issues which prohibited therapy (chart reviewed; consulted NSG) Pt is currently NPO; DG Esophagus scheduled for this morning. ST services will f/u later today time permitting. Noted GI consulted as well.       Orinda Kenner, MS, CCC-SLP Speech Language Pathologist Rehab Services; [place] [phone] (ascom) Mattisen Pohlmann 03/26/2022, 10:28 AM

## 2022-03-26 NOTE — Assessment & Plan Note (Addendum)
Replaced accordingly

## 2022-03-26 NOTE — H&P (Addendum)
[name]   PATIENT NAME: [name]    MR#:  [id number]  DATE OF BIRTH:  December 03, 1931  DATE OF ADMISSION:  03/25/2022  PRIMARY CARE PHYSICIAN: Burnard Hawthorne, FNP   Patient is coming from: Home  REQUESTING/REFERRING PHYSICIAN: Vladimir Crofts, MD  CHIEF COMPLAINT:   Chief Complaint  Patient presents with   Altered Mental Status    HISTORY OF PRESENT ILLNESS:  [name] is a 88 y.o. Caucasian female with medical history significant for vascular and Alzheimer's dementia, anxiety, depression, stage IIIa CKD, GERD, obstructive sleep apnea and hypocalcemia, who presented to ER with acute onset of altered mental status with confusion and zoning out.  Her daughter was concerned about a UTI.  The patient has been spitting food including liquids and solids over the last few days with progressive dysphagia.  She has a history of of esophageal stricture but a couple years ago.  Over the past week and she is not been able to keep her medications or eat any food.  She occasionally takes very small bites that have been able to pass but consistently has postprandial spitting up.  No hematemesis or bloody vomitus.  No diarrhea or constipation, melena or bright red bleeding per rectum.  No chest pain or dyspnea or cough or wheezing.  The patient admits to urinary frequency and urgency without significant dysuria.  No hematuria or flank pain.  ED Course: When she came to the ER, BP was 146/67 with otherwise normal vital signs.  Labs revealed a BUN of 15 and creatinine 1.08 with potassium of 3.4 glucose 102 with calcium of 10.5.  High sensitive troponin I was 6 and later the same.  Lactic acid was 1.1 and CBC showed hemoconcentration.  UA was hazy and showed 11-20 WBCs and 21-50 RBCs with 30 protein and 20 ketones. EKG as reviewed by me : EKG showed normal sinus rhythm with a rate of 91 with low voltage QRS and poor R wave progression. Imaging: Two-view chest x-ray showed no acute  cardiopulmonary disease.  Noncontrasted CT scan revealed no acute intracranial abnormalities.  The patient was given 1 L bolus of IV lactated Ringer.  She will be admitted to a medical bed for further evaluation and management. PAST MEDICAL HISTORY:   Past Medical History:  Diagnosis Date   Anxiety    Depression    GERD (gastroesophageal reflux disease)    Hypercalcemia    Pneumonia    Sepsis ([place])    Sleep apnea    SOB (shortness of breath) on exertion   -Esophageal stricture s/p dilatation  PAST SURGICAL HISTORY:   Past Surgical History:  Procedure Laterality Date   ENDOMETRIAL ABLATION     EYE SURGERY  2011   left cataract   KNEE ARTHROSCOPY Left 05/03/2015   Procedure: left knee arthroscopy, partial medial menisectomy, chondroplasty;  Surgeon: Dereck Leep, MD;  Location: [facility] ORS;  Service: Orthopedics;  Laterality: Left;   TONSILLECTOMY    -EGD and dilatation of esophageal stricture.  SOCIAL HISTORY:   Social History   Tobacco Use   Smoking status: Never   Smokeless tobacco: Never  Substance Use Topics   Alcohol use: No    FAMILY HISTORY:   Family History  Problem Relation Age of Onset   Cancer Mother 54       ovarian    Pernicious anemia Mother    COPD Father        nonsmoker   Cancer Cousin  ovarian ca    DRUG ALLERGIES:   Allergies  Allergen Reactions   Codeine Other (See Comments)    Crying spells   Darvon [Propoxyphene Hcl] Other (See Comments)    Crying spells   Propoxyphene Other (See Comments)    Crying, altered mental status    REVIEW OF SYSTEMS:   ROS As per history of present illness. All pertinent systems were reviewed above. Constitutional, HEENT, cardiovascular, respiratory, GI, GU, musculoskeletal, neuro, psychiatric, endocrine, integumentary and hematologic systems were reviewed and are otherwise negative/unremarkable except for positive findings mentioned above in the HPI.   MEDICATIONS AT HOME:   Prior to  Admission medications   Medication Sig Start Date End Date Taking? Authorizing Provider  busPIRone (BUSPAR) 10 MG tablet Take 2 tablets (20 mg total) by mouth at bedtime. 09/21/21  Yes Ursula Alert, MD  cetirizine (ZYRTEC) 5 MG tablet TAKE 1 TABLET(5 MG) BY MOUTH DAILY 03/14/22  Yes Arnett, Yvetta Coder, FNP  Cholecalciferol (VITAMIN D-3 PO) Take 1 capsule by mouth daily. 500iu   Yes [provider]  escitalopram (LEXAPRO) 10 MG tablet Take 1 tablet (10 mg total) by mouth daily. 06/22/21  Yes Ursula Alert, MD  famotidine (PEPCID) 20 MG tablet TAKE 1 TABLET(20 MG) BY MOUTH AT BEDTIME 03/15/22  Yes Arnett, Yvetta Coder, FNP  memantine (NAMENDA) 5 MG tablet Take 5 mg by mouth 2 (two) times daily.   Yes [provider]  rosuvastatin (CRESTOR) 5 MG tablet Take 1 tablet (5 mg total) by mouth every evening. 07/10/21  Yes Arnett, Yvetta Coder, FNP  vitamin B-12 (CYANOCOBALAMIN) 1000 MCG tablet Take 1,000 mcg by mouth daily.   Yes [provider]      VITAL SIGNS:  Blood pressure (!) 152/55, pulse 87, temperature 98.5 F (36.9 C), temperature source Oral, resp. rate 13, height '5\' 3"'$  (1.6 m), weight 81 kg, SpO2 97 %.  PHYSICAL EXAMINATION:  Physical Exam  GENERAL:  88 y.o.-year-old, Caucasian female patient  lying in the bed with no acute distress.  EYES: Pupils equal, round, reactive to light and accommodation. No scleral icterus. Extraocular muscles intact.  HEENT: Head atraumatic, normocephalic. Oropharynx and nasopharynx clear.  NECK:  Supple, no jugular venous distention. No thyroid enlargement, no tenderness.  LUNGS: Normal breath sounds bilaterally, no wheezing, rales,rhonchi or crepitation. No use of accessory muscles of respiration.  CARDIOVASCULAR: Regular rate and rhythm, S1, S2 normal. No murmurs, rubs, or gallops.  ABDOMEN: Soft, nondistended, nontender. Bowel sounds present. No organomegaly or mass.  EXTREMITIES: No pedal edema, cyanosis, or clubbing.   NEUROLOGIC: Cranial nerves II through XII are intact. Muscle strength 5/5 in all extremities. Sensation intact. Gait not checked.  PSYCHIATRIC: The patient is alert and oriented x 3.  Normal affect and good eye contact. SKIN: No obvious rash, lesion, or ulcer.   LABORATORY PANEL:   CBC Recent Labs  Lab 03/25/22 1909  WBC 6.6  HGB 16.2*  HCT 50.9*  PLT 219   ------------------------------------------------------------------------------------------------------------------  Chemistries  Recent Labs  Lab 03/25/22 1909  NA 138  K 3.4*  CL 103  CO2 25  GLUCOSE 102*  BUN 15  CREATININE 1.08*  CALCIUM 10.5*  AST 19  ALT 12  ALKPHOS 62  BILITOT 1.2   ------------------------------------------------------------------------------------------------------------------  Cardiac Enzymes No results for input(s): "TROPONINI" in the last 168 hours. ------------------------------------------------------------------------------------------------------------------  RADIOLOGY:  CT Head Wo Contrast  Result Date: 03/25/2022 CLINICAL DATA:  Mental status change, unknown cause EXAM: CT HEAD WITHOUT CONTRAST TECHNIQUE: Contiguous axial images  were obtained from the base of the skull through the vertex without intravenous contrast. RADIATION DOSE REDUCTION: This exam was performed according to the departmental dose-optimization program which includes automated exposure control, adjustment of the mA and/or kV according to patient size and/or use of iterative reconstruction technique. COMPARISON:  CT head 07/20/2020 BRAIN: BRAIN Cerebral ventricle sizes are concordant with the degree of cerebral volume loss-frontal lobe atrophy predominant again noted. Patchy and confluent areas of decreased attenuation are noted throughout the deep and periventricular white matter of the cerebral hemispheres bilaterally, compatible with chronic microvascular ischemic disease. No evidence of large-territorial acute  infarction. No parenchymal hemorrhage. No mass lesion. No extra-axial collection. No mass effect or midline shift. No hydrocephalus. Basilar cisterns are patent. Vascular: No hyperdense vessel. Atherosclerotic calcifications are present within the cavernous internal carotid arteries. Skull: No acute fracture or focal lesion. Sinuses/Orbits: Paranasal sinuses and mastoid air cells are clear. Left lens replacement. Otherwise the orbits are unremarkable. Other: None. IMPRESSION: No acute intracranial abnormality. Electronically Signed   By: Iven Finn M.D.   On: 03/25/2022 19:48   DG Chest 2 View  Result Date: 03/25/2022 CLINICAL DATA:  Altered mental status EXAM: CHEST - 2 VIEW COMPARISON:  05/30/2016 FINDINGS: Cardiac shadow is within normal limits. Aortic calcifications are again noted. Lungs are clear bilaterally. Mild degenerative changes of the thoracic spine are noted. IMPRESSION: No acute abnormality noted. Electronically Signed   By: Inez Catalina M.D.   On: 03/25/2022 19:43      IMPRESSION AND PLAN:  Assessment and Plan: * Dysphagia - The patient will be admitted to a medical bed. - She will be kept NPO. - Speech therapy consult will be obtained.  - GI consult will be obtained as she will likely need EGD for the possibility of recurrent esophageal stricture or other esophageal etiologies.. - I notified Dr. Haig Prophet about the patient   Sepsis due to gram-negative UTI [facility]) - This is manifested heart rate of 94 and respiratory to 23 in the setting of UTI. - The patient will be placed on IV Rocephin and will follow urine culture and sensitivity. - She will be hydrated with IV normal saline. - We will follow blood cultures.  Hypokalemia - Potassium will be replaced and magnesium level will be checked.  Vitamin B12 deficiency - We will continue vitamin B12.  Dyslipidemia - We will continue statin therapy  Anxiety and depression - We will continue BuSpar and  Lexapro.  Dementia with behavioral disturbance ([place]) - We will continue Namenda and utilize as needed Haldol.   DVT prophylaxis: Lovenox.  Advanced Care Planning:  Code Status: full code.  Family Communication:  The plan of care was discussed in details with the patient (and family). I answered all questions. The patient agreed to proceed with the above mentioned plan. Further management will depend upon hospital course. Disposition Plan: Back to previous home environment Consults called: Gastroenterology. All the records are reviewed and case discussed with ED provider.  Status is: Inpatient  At the time of the admission, it appears that the appropriate admission status for this patient is inpatient.  This is judged to be reasonable and necessary in order to provide the required intensity of service to ensure the patient's safety given the presenting symptoms, physical exam findings and initial radiographic and laboratory data in the context of comorbid conditions.  The patient requires inpatient status due to high intensity of service, high risk of further deterioration and high frequency of surveillance required.  I certify that at the time of admission, it is my clinical judgment that the patient will require inpatient hospital care extending more than 2 midnights.                            Dispo: The patient is from: Home              Anticipated d/c is to: Home              Patient currently is not medically stable to d/c.              Difficult to place patient: No  Christel Mormon M.D on 03/26/2022 at 4:51 AM  [facility]   From 7 PM-7 AM, contact night-coverage [email or web address]  CC: Primary care physician; Burnard Hawthorne, FNP

## 2022-03-26 NOTE — ED Provider Notes (Signed)
[facility] Provider Note    Event Date/Time   First MD Initiated Contact with Patient 03/26/22 0006     (approximate)   History   Altered Mental Status   HPI  [name] is a 88 y.o. female who presents to the ED for evaluation of Altered Mental Status   I review telemedicine visit from about 8 hours ago.  Patient seen with her daughter with concerns for a UTI as she has been more confused, less cooperative, "zoning out."  Urged to go to the ER.  Also history of mixed vascular and Alzheimer's dementia.  Major depression.  CKD.  Patient's daughter and son bring her to the ER for evaluation of a few days of progressively worsening dysphagia, intolerance of solids and liquids, and concurrent progressively worsening confusion and altered mentation.  Daughter reports that she has required esophageal dilation in the past, as recently as a couple years ago.  Over this past weekend, she's been unable to take her typical medications and has hardly eaten any food.  A couple small bites have been able to pass, but largely postprandial spitting up.  No isolated or frank emesis outside of postprandial episodes  Physical Exam   Triage Vital Signs: ED Triage Vitals [03/25/22 1902]  Enc Vitals Group     BP (!) 146/67     Pulse Rate 88     Resp 18     Temp 98.6 F (37 C)     Temp Source Oral     SpO2 96 %     Weight      Height      Head Circumference      Peak Flow      Pain Score      Pain Loc      Pain Edu?      Excl. in [place]?     Most recent vital signs: Vitals:   03/26/22 0030 03/26/22 0130  BP: (!) 156/75 (!) 152/55  Pulse: 90 87  Resp: (!) 23 13  Temp:    SpO2: 97% 97%    General: Awake, no distress.  Pleasantly disoriented.  Dry mucous membranes. CV:  Good peripheral perfusion.  Resp:  Normal effort.  Abd:  No distention.  MSK:  No deformity noted.  Neuro:  No focal deficits appreciated. Cranial nerves II through XII intact 5/5  strength and sensation in all 4 extremities Other:     ED Results / Procedures / Treatments   Labs (all labs ordered are listed, but only abnormal results are displayed) Labs Reviewed  COMPREHENSIVE METABOLIC PANEL - Abnormal; Notable for the following components:      Result Value   Potassium 3.4 (*)    Glucose, Bld 102 (*)    Creatinine, Ser 1.08 (*)    Calcium 10.5 (*)    GFR, Estimated 49 (*)    All other components within normal limits  CBC WITH DIFFERENTIAL/PLATELET - Abnormal; Notable for the following components:   RBC 5.50 (*)    Hemoglobin 16.2 (*)    HCT 50.9 (*)    All other components within normal limits  URINALYSIS, ROUTINE W REFLEX MICROSCOPIC - Abnormal; Notable for the following components:   Color, Urine YELLOW (*)    APPearance HAZY (*)    Hgb urine dipstick MODERATE (*)    Ketones, ur 20 (*)    Protein, ur 30 (*)    All other components within normal limits  RESP PANEL BY RT-PCR (  FLU A&B, COVID) ARPGX2  LACTIC ACID, PLASMA  TROPONIN I (HIGH SENSITIVITY)  TROPONIN I (HIGH SENSITIVITY)    EKG   RADIOLOGY CXR interpreted by me without evidence of acute cardiopulmonary pathology. CT head interpreted by me without evidence of acute intracranial pathology  Official radiology report(s): CT Head Wo Contrast  Result Date: 03/25/2022 CLINICAL DATA:  Mental status change, unknown cause EXAM: CT HEAD WITHOUT CONTRAST TECHNIQUE: Contiguous axial images were obtained from the base of the skull through the vertex without intravenous contrast. RADIATION DOSE REDUCTION: This exam was performed according to the departmental dose-optimization program which includes automated exposure control, adjustment of the mA and/or kV according to patient size and/or use of iterative reconstruction technique. COMPARISON:  CT head 07/20/2020 BRAIN: BRAIN Cerebral ventricle sizes are concordant with the degree of cerebral volume loss-frontal lobe atrophy predominant again noted.  Patchy and confluent areas of decreased attenuation are noted throughout the deep and periventricular white matter of the cerebral hemispheres bilaterally, compatible with chronic microvascular ischemic disease. No evidence of large-territorial acute infarction. No parenchymal hemorrhage. No mass lesion. No extra-axial collection. No mass effect or midline shift. No hydrocephalus. Basilar cisterns are patent. Vascular: No hyperdense vessel. Atherosclerotic calcifications are present within the cavernous internal carotid arteries. Skull: No acute fracture or focal lesion. Sinuses/Orbits: Paranasal sinuses and mastoid air cells are clear. Left lens replacement. Otherwise the orbits are unremarkable. Other: None. IMPRESSION: No acute intracranial abnormality. Electronically Signed   By: Iven Finn M.D.   On: 03/25/2022 19:48   DG Chest 2 View  Result Date: 03/25/2022 CLINICAL DATA:  Altered mental status EXAM: CHEST - 2 VIEW COMPARISON:  05/30/2016 FINDINGS: Cardiac shadow is within normal limits. Aortic calcifications are again noted. Lungs are clear bilaterally. Mild degenerative changes of the thoracic spine are noted. IMPRESSION: No acute abnormality noted. Electronically Signed   By: Inez Catalina M.D.   On: 03/25/2022 19:43    PROCEDURES and INTERVENTIONS:  Procedures  Medications  lactated ringers bolus 1,000 mL (has no administration in time range)     IMPRESSION / MDM / ASSESSMENT AND PLAN / ED COURSE  I reviewed the triage vital signs and the nursing notes.  Differential diagnosis includes, but is not limited to, esophageal stenosis. UTI, stroke, dehydration.   {Patient presents with symptoms of an acute illness or injury that is potentially life-threatening.  88 year old female presents from home with worsening dysphagia with concurrent signs of dehydration, requiring medical admission for rehydration and GI evaluation for possible endoscopy.  She looks dry to me, but no signs of  neurologic or vascular deficits.  Work-up with ketones in the urine, but no clear signs of acute cystitis.  Negative troponins.  CKD around baseline.  No lactic acidosis or leukocytosis to suggest sepsis.  CT head without signs of CNS pathology and no signs of aspiration on CXR.  Consulted medicine who agrees to admit.  Initiated IV rehydration in the ED.  Clinical Course as of 03/26/22 0215  Tue Mar 26, 2022  0204 Reassessed and discussed work-up with the daughter and my recommendation for admission despite no signs of UTI.  We discussed dehydration, dysphagia and possible need for endoscopy.  She is agreeable. [DS]  0212 I consult with medicine who agrees to admit [DS]    Clinical Course User Index [DS] Vladimir Crofts, MD     FINAL CLINICAL IMPRESSION(S) / ED DIAGNOSES   Final diagnoses:  Other dysphagia  Dehydration  Altered mental status, unspecified altered mental status  type     Rx / DC Orders   ED Discharge Orders     None        Note:  This document was prepared using Dragon voice recognition software and may include unintentional dictation errors.   Vladimir Crofts, MD 03/26/22 [phone]

## 2022-03-26 NOTE — Progress Notes (Signed)
PROGRESS NOTE   HPI was taken from Dr. Sidney Ace:  [name] is a 88 y.o. Caucasian female with medical history significant for vascular and Alzheimer's dementia, anxiety, depression, stage IIIa CKD, GERD, obstructive sleep apnea and hypocalcemia, who presented to ER with acute onset of altered mental status with confusion and zoning out.  Her daughter was concerned about a UTI.  The patient has been spitting food including liquids and solids over the last few days with progressive dysphagia.  She has a history of of esophageal stricture but a couple years ago.  Over the past week and she is not been able to keep her medications or eat any food.  She occasionally takes very small bites that have been able to pass but consistently has postprandial spitting up.  No hematemesis or bloody vomitus.  No diarrhea or constipation, melena or bright red bleeding per rectum.  No chest pain or dyspnea or cough or wheezing.  The patient admits to urinary frequency and urgency without significant dysuria.  No hematuria or flank pain.   ED Course: When she came to the ER, BP was 146/67 with otherwise normal vital signs.  Labs revealed a BUN of 15 and creatinine 1.08 with potassium of 3.4 glucose 102 with calcium of 10.5.  High sensitive troponin I was 6 and later the same.  Lactic acid was 1.1 and CBC showed hemoconcentration.  UA was hazy and showed 11-20 WBCs and 21-50 RBCs with 30 protein and 20 ketones. EKG as reviewed by me : EKG showed normal sinus rhythm with a rate of 91 with low voltage QRS and poor R wave progression. Imaging: Two-view chest x-ray showed no acute cardiopulmonary disease.  Noncontrasted CT scan revealed no acute intracranial abnormalities.   The patient was given 1 L bolus of IV lactated Ringer.  She will be admitted to a medical bed for further evaluation and management.   [name]  TKZ:[id number] DOB: 11-Nov-1931 DOA: 03/25/2022 PCP: Burnard Hawthorne, FNP   Assessment & Plan:    Principal Problem:   Dysphagia Active Problems:   Sepsis due to gram-negative UTI ([place])   Hypokalemia   Dementia with behavioral disturbance (HCC)   Anxiety and depression   Dyslipidemia   Vitamin B12 deficiency  Assessment and Plan: Dysphagia: NPO. Speech consulted. Hx of esophageal stricture. GI consulted   Sepsis: met criteria w/ tachycardia, tachypnea & UTI. Continue on IV rocephin. Urine cx is pending.   UTI: urine cx is pending. Continue on IV rocephin    Hypokalemia: potassium given   Vitamin B12 deficiency: continue on B12 supplement    HLD: continue on statin    Depression: severity unknown. Continue on home dose of lexapro, buspar   Dementia: with behavioral disturbance. Continue on home dose of namenda.      DVT prophylaxis: lovenox  Code Status: full  Family Communication: discussed pt's care w/ pt's family at bedside and answered their questions  Disposition Plan: depends on PT/OT recs (not consulted yet)   Level of care: Med-Surg  Status is: Inpatient Remains inpatient appropriate because: severity of illness    Consultants:  GI  Procedures:   Antimicrobials:    Subjective: Pt c/o fatigue   Objective: Vitals:   03/26/22 0430 03/26/22 0445 03/26/22 0500 03/26/22 0625  BP: (!) 150/71  (!) 149/63 138/79  Pulse: 92  87 93  Resp:  15 16 (!) 21  Temp:   97.9 F (36.6 C)   TempSrc:   Oral  SpO2: 98%  98% 100%  Weight:      Height:        Intake/Output Summary (Last 24 hours) at 03/26/2022 0846 Last data filed at 03/26/2022 0505 Gross per 24 hour  Intake 1000 ml  Output --  Net 1000 ml   Filed Weights   03/26/22 0146  Weight: 81 kg    Examination:  General exam: Appears calm and comfortable  Respiratory system: Clear to auscultation. Respiratory effort normal. Cardiovascular system: S1 & S2 heard, RRR. No JVD, murmurs, rubs, gallops or clicks. No pedal edema. Gastrointestinal system: Abdomen is nondistended, soft and  nontender. No organomegaly or masses felt. Normal bowel sounds heard. Central nervous system: Alert and oriented. No focal neurological deficits. Extremities: Symmetric 5 x 5 power. Skin: No rashes, lesions or ulcers Psychiatry: Judgement and insight appear normal. Mood & affect appropriate.     Data Reviewed: I have personally reviewed following labs and imaging studies  CBC: Recent Labs  Lab 03/25/22 1909  WBC 6.6  NEUTROABS 4.4  HGB 16.2*  HCT 50.9*  MCV 92.5  PLT 917   Basic Metabolic Panel: Recent Labs  Lab 03/25/22 1909  NA 138  K 3.4*  CL 103  CO2 25  GLUCOSE 102*  BUN 15  CREATININE 1.08*  CALCIUM 10.5*   GFR: Estimated Creatinine Clearance: 36.3 mL/min (A) (by C-G formula based on SCr of 1.08 mg/dL (H)). Liver Function Tests: Recent Labs  Lab 03/25/22 1909  AST 19  ALT 12  ALKPHOS 62  BILITOT 1.2  PROT 7.8  ALBUMIN 4.2   No results for input(s): "LIPASE", "AMYLASE" in the last 168 hours. No results for input(s): "AMMONIA" in the last 168 hours. Coagulation Profile: No results for input(s): "INR", "PROTIME" in the last 168 hours. Cardiac Enzymes: No results for input(s): "CKTOTAL", "CKMB", "CKMBINDEX", "TROPONINI" in the last 168 hours. BNP (last 3 results) No results for input(s): "PROBNP" in the last 8760 hours. HbA1C: No results for input(s): "HGBA1C" in the last 72 hours. CBG: No results for input(s): "GLUCAP" in the last 168 hours. Lipid Profile: No results for input(s): "CHOL", "HDL", "LDLCALC", "TRIG", "CHOLHDL", "LDLDIRECT" in the last 72 hours. Thyroid Function Tests: No results for input(s): "TSH", "T4TOTAL", "FREET4", "T3FREE", "THYROIDAB" in the last 72 hours. Anemia Panel: No results for input(s): "VITAMINB12", "FOLATE", "FERRITIN", "TIBC", "IRON", "RETICCTPCT" in the last 72 hours. Sepsis Labs: Recent Labs  Lab 03/25/22 1909  LATICACIDVEN 1.1    Recent Results (from the past 240 hour(s))  Resp Panel by RT-PCR (Flu A&B,  Covid) Anterior Nasal Swab     Status: None   Collection Time: 03/25/22  7:02 PM   Specimen: Anterior Nasal Swab  Result Value Ref Range Status   SARS Coronavirus 2 by RT PCR NEGATIVE NEGATIVE Final    Comment: (NOTE) SARS-CoV-2 target nucleic acids are NOT DETECTED.  The SARS-CoV-2 RNA is generally detectable in upper respiratory specimens during the acute phase of infection. The lowest concentration of SARS-CoV-2 viral copies this assay can detect is 138 copies/mL. A negative result does not preclude SARS-Cov-2 infection and should not be used as the sole basis for treatment or other patient management decisions. A negative result may occur with  improper specimen collection/handling, submission of specimen other than nasopharyngeal swab, presence of viral mutation(s) within the areas targeted by this assay, and inadequate number of viral copies(<138 copies/mL). A negative result must be combined with clinical observations, patient history, and epidemiological information. The expected result is Negative.  Fact Sheet for Patients:  EntrepreneurPulse.com.au  Fact Sheet for Healthcare Providers:  IncredibleEmployment.be  This test is no t yet approved or cleared by the [place] FDA and  has been authorized for detection and/or diagnosis of SARS-CoV-2 by FDA under an Emergency Use Authorization (EUA). This EUA will remain  in effect (meaning this test can be used) for the duration of the COVID-19 declaration under Section 564(b)(1) of the Act, 21 U.S.C.section 360bbb-3(b)(1), unless the authorization is terminated  or revoked sooner.       Influenza A by PCR NEGATIVE NEGATIVE Final   Influenza B by PCR NEGATIVE NEGATIVE Final    Comment: (NOTE) The Xpert Xpress SARS-CoV-2/FLU/RSV plus assay is intended as an aid in the diagnosis of influenza from Nasopharyngeal swab specimens and should not be used as a sole basis for treatment. Nasal  washings and aspirates are unacceptable for Xpert Xpress SARS-CoV-2/FLU/RSV testing.  Fact Sheet for Patients: EntrepreneurPulse.com.au  Fact Sheet for Healthcare Providers: IncredibleEmployment.be  This test is not yet approved or cleared by the [place] FDA and has been authorized for detection and/or diagnosis of SARS-CoV-2 by FDA under an Emergency Use Authorization (EUA). This EUA will remain in effect (meaning this test can be used) for the duration of the COVID-19 declaration under Section 564(b)(1) of the Act, 21 U.S.C. section 360bbb-3(b)(1), unless the authorization is terminated or revoked.  Performed at [facility], [address] [place],  [address]          Radiology Studies: CT Head Wo Contrast  Result Date: 03/25/2022 CLINICAL DATA:  Mental status change, unknown cause EXAM: CT HEAD WITHOUT CONTRAST TECHNIQUE: Contiguous axial images were obtained from the base of the skull through the vertex without intravenous contrast. RADIATION DOSE REDUCTION: This exam was performed according to the departmental dose-optimization program which includes automated exposure control, adjustment of the mA and/or kV according to patient size and/or use of iterative reconstruction technique. COMPARISON:  CT head 07/20/2020 BRAIN: BRAIN Cerebral ventricle sizes are concordant with the degree of cerebral volume loss-frontal lobe atrophy predominant again noted. Patchy and confluent areas of decreased attenuation are noted throughout the deep and periventricular white matter of the cerebral hemispheres bilaterally, compatible with chronic microvascular ischemic disease. No evidence of large-territorial acute infarction. No parenchymal hemorrhage. No mass lesion. No extra-axial collection. No mass effect or midline shift. No hydrocephalus. Basilar cisterns are patent. Vascular: No hyperdense vessel. Atherosclerotic calcifications are  present within the cavernous internal carotid arteries. Skull: No acute fracture or focal lesion. Sinuses/Orbits: Paranasal sinuses and mastoid air cells are clear. Left lens replacement. Otherwise the orbits are unremarkable. Other: None. IMPRESSION: No acute intracranial abnormality. Electronically Signed   By: Iven Finn M.D.   On: 03/25/2022 19:48   DG Chest 2 View  Result Date: 03/25/2022 CLINICAL DATA:  Altered mental status EXAM: CHEST - 2 VIEW COMPARISON:  05/30/2016 FINDINGS: Cardiac shadow is within normal limits. Aortic calcifications are again noted. Lungs are clear bilaterally. Mild degenerative changes of the thoracic spine are noted. IMPRESSION: No acute abnormality noted. Electronically Signed   By: Inez Catalina M.D.   On: 03/25/2022 19:43        Scheduled Meds:  busPIRone  20 mg Oral QHS   cholecalciferol  1,000 mcg Oral Daily   cyanocobalamin  1,000 mcg Oral Daily   enoxaparin (LOVENOX) injection  0.5 mg/kg Subcutaneous Q24H   escitalopram  10 mg Oral Daily   famotidine  20 mg Oral QHS  loratadine  10 mg Oral Daily   memantine  5 mg Oral BID   potassium chloride  40 mEq Oral Once   rosuvastatin  5 mg Oral QPM   Continuous Infusions:  0.9 % NaCl with KCl 20 mEq / L 100 mL/hr at 03/26/22 0636   cefTRIAXone (ROCEPHIN)  IV Stopped (03/26/22 0537)     LOS: 0 days    Time spent: 33 mins    Wyvonnia Dusky, MD [facility] Pager [phone]  If 7PM-7AM, please contact night-coverage [email or web address] 03/26/2022, 8:46 AM

## 2022-03-26 NOTE — Assessment & Plan Note (Signed)
-   We will continue statin therapy. 

## 2022-03-26 NOTE — Assessment & Plan Note (Addendum)
Met criteria given heart rate greater than 90 and respiratory rate greater than 20 plus UTI source.  On IV Rocephin.  Sepsis has since stabilized.  Continue IV fluids.  Following on urine follow urine and blood cultures.  Follow urine and blood cultures. Urine and blood cultures with no growth.  Patient completed 3 days of IV antibiotics and no need for further

## 2022-03-27 ENCOUNTER — Encounter: Payer: Self-pay | Admitting: Family Medicine

## 2022-03-27 ENCOUNTER — Encounter
Admission: EM | Disposition: A | Discharge status: Home Health | Payer: Self-pay | Source: Home / Self Care | Attending: Emergency Medicine

## 2022-03-27 ENCOUNTER — Inpatient Hospital Stay: Payer: Medicare Other | Admitting: Anesthesiology

## 2022-03-27 DIAGNOSIS — K222 Esophageal obstruction: Secondary | ICD-10-CM

## 2022-03-27 DIAGNOSIS — R4182 Altered mental status, unspecified: Secondary | ICD-10-CM | POA: Insufficient documentation

## 2022-03-27 DIAGNOSIS — R1319 Other dysphagia: Secondary | ICD-10-CM

## 2022-03-27 DIAGNOSIS — K449 Diaphragmatic hernia without obstruction or gangrene: Secondary | ICD-10-CM | POA: Diagnosis not present

## 2022-03-27 DIAGNOSIS — F418 Other specified anxiety disorders: Secondary | ICD-10-CM | POA: Diagnosis not present

## 2022-03-27 DIAGNOSIS — F03918 Unspecified dementia, unspecified severity, with other behavioral disturbance: Secondary | ICD-10-CM | POA: Diagnosis not present

## 2022-03-27 DIAGNOSIS — A419 Sepsis, unspecified organism: Secondary | ICD-10-CM

## 2022-03-27 DIAGNOSIS — E663 Overweight: Secondary | ICD-10-CM | POA: Diagnosis present

## 2022-03-27 HISTORY — PX: ESOPHAGOGASTRODUODENOSCOPY (EGD) WITH PROPOFOL: SHX5813

## 2022-03-27 LAB — BASIC METABOLIC PANEL
Anion gap: 6 (ref 5–15)
BUN: 11 mg/dL (ref 8–23)
CO2: 25 mmol/L (ref 22–32)
Calcium: 9.2 mg/dL (ref 8.9–10.3)
Chloride: 110 mmol/L (ref 98–111)
Creatinine, Ser: 0.94 mg/dL (ref 0.44–1.00)
GFR, Estimated: 58 mL/min — ABNORMAL LOW (ref >60)
Glucose, Bld: 77 mg/dL (ref 70–99)
Potassium: 3.6 mmol/L (ref 3.5–5.1)
Sodium: 141 mmol/L (ref 135–145)

## 2022-03-27 LAB — CBC
HCT: 42.1 % (ref 36.0–46.0)
Hemoglobin: 13.5 g/dL (ref 12.0–15.0)
MCH: 29.5 pg (ref 26.0–34.0)
MCHC: 32.1 g/dL (ref 30.0–36.0)
MCV: 91.9 fL (ref 80.0–100.0)
Platelets: 162 10*3/uL (ref 150–400)
RBC: 4.58 MIL/uL (ref 3.87–5.11)
RDW: 14 % (ref 11.5–15.5)
WBC: 5.2 10*3/uL (ref 4.0–10.5)
nRBC: 0 % (ref 0.0–0.2)

## 2022-03-27 LAB — URINE CULTURE: Culture: NO GROWTH

## 2022-03-27 SURGERY — ESOPHAGOGASTRODUODENOSCOPY (EGD) WITH PROPOFOL
Anesthesia: General

## 2022-03-27 MED ORDER — PROPOFOL 500 MG/50ML IV EMUL
INTRAVENOUS | Status: DC | PRN
  Administered 2022-03-27: 100 ug/kg/min via INTRAVENOUS

## 2022-03-27 MED ORDER — LIDOCAINE HCL (CARDIAC) PF 100 MG/5ML IV SOSY
PREFILLED_SYRINGE | INTRAVENOUS | Status: DC | PRN
  Administered 2022-03-27: 80 mg via INTRAVENOUS

## 2022-03-27 MED ORDER — SODIUM CHLORIDE 0.9 % IV SOLN: INTRAVENOUS | Status: DC

## 2022-03-27 MED ORDER — PROPOFOL 10 MG/ML IV BOLUS
INTRAVENOUS | Status: DC | PRN
  Administered 2022-03-27: 40 mg via INTRAVENOUS

## 2022-03-27 NOTE — Transfer of Care (Signed)
Immediate Anesthesia Transfer of Care Note  Patient: [name]  Procedure(s) Performed: ESOPHAGOGASTRODUODENOSCOPY (EGD) WITH PROPOFOL  Patient Location: PACU  Anesthesia Type:General  Level of Consciousness: awake and drowsy  Airway & Oxygen Therapy: Patient Spontanous Breathing and Patient connected to nasal cannula oxygen  Post-op Assessment: Report given to RN and Post -op Vital signs reviewed and stable  Post vital signs: Reviewed and stable  Last Vitals:  Vitals Value Taken Time  BP 138/84 03/27/22 1325  Temp 36.8 C 03/27/22 1322  Pulse 87 03/27/22 1325  Resp 15 03/27/22 1325  SpO2 95 % 03/27/22 1325    Last Pain:  Vitals:   03/27/22 1322  TempSrc: Temporal  PainSc: Asleep         Complications: No notable events documented.

## 2022-03-27 NOTE — Anesthesia Preprocedure Evaluation (Addendum)
Anesthesia Evaluation  Patient identified by MRN, date of birth, ID band Patient awake    Reviewed: Allergy & Precautions, NPO status , Patient's Chart, lab work & pertinent test results  Airway Mallampati: IV  TM Distance: >3 FB Neck ROM: full    Dental  (+) Edentulous Upper, Edentulous Lower   Pulmonary shortness of breath and with exertion, sleep apnea ,    Pulmonary exam normal        Cardiovascular Exercise Tolerance: Poor Normal cardiovascular exam     Neuro/Psych PSYCHIATRIC DISORDERS Depression Dementia Lewy body dementia with behavioral disturbance negative neurological ROS     GI/Hepatic Neg liver ROS, GERD  Poorly Controlled,progressive dysphagia.   Endo/Other  negative endocrine ROS  Renal/GU Renal InsufficiencyRenal disease  negative genitourinary   Musculoskeletal   Abdominal Normal abdominal exam  (+)   Peds  Hematology negative hematology ROS (+)   Anesthesia Other Findings PER GI NOTE: presented to ER with acute onset of altered mental status with confusion - it was also noted she was having some problems wihth spitting up foods over the last week. Speech has been consulted. There is a report of a history of an esophageal stricture in past. Barium swallow has been ordered for today. She was given a dose of rocephin for concern of UTi and had an unremarkable nonconrasted head CT. She is not anemic. GFR 49. Liver enzymes unremarkable. Chest xray unremarkable. Utine culture pending Daughter is with her who reports patient has had some advancing dementia- started a week or so ago with some progressive confusion, difficulty with attention and then a couple days later with difficulty eating and taking medications- spit up food frequently and was unable to swallow well. She has had no abdominal pain    Past Medical History: No date: Anxiety No date: Depression No date: GERD (gastroesophageal reflux  disease) No date: Hypercalcemia No date: Pneumonia No date: Sepsis ([place]) No date: Sleep apnea No date: SOB (shortness of breath) on exertion  Past Surgical History: No date: ENDOMETRIAL ABLATION 2011: EYE SURGERY     Comment:  left cataract 05/03/2015: KNEE ARTHROSCOPY; Left     Comment:  Procedure: left knee arthroscopy, partial medial               menisectomy, chondroplasty;  Surgeon: Dereck Leep, MD;              Location: [facility] ORS;  Service: Orthopedics;  Laterality:               Left; No date: TONSILLECTOMY  BMI    Body Mass Index: 31.63 kg/m      Reproductive/Obstetrics negative OB ROS                           Anesthesia Physical Anesthesia Plan  ASA: 3  Anesthesia Plan: General   Post-op Pain Management: Minimal or no pain anticipated   Induction: Intravenous  PONV Risk Score and Plan: TIVA, Propofol infusion and Treatment may vary due to age or medical condition  Airway Management Planned: Natural Airway  Additional Equipment:   Intra-op Plan:   Post-operative Plan:   Informed Consent: I have reviewed the patients History and Physical, chart, labs and discussed the procedure including the risks, benefits and alternatives for the proposed anesthesia with the patient or authorized representative who has indicated his/her understanding and acceptance.     Dental Advisory Given  Plan Discussed with: Anesthesiologist, CRNA and Surgeon  Anesthesia Plan Comments: (Dr. Haig Prophet does not think foreign material is in the esophagus at this time due to the barium swallow results.   Patient consented for risks of anesthesia including but not limited to:  - adverse reactions to medications - risk of airway placement if required - damage to eyes, teeth, lips or other oral mucosa - nerve damage due to positioning  - sore throat or hoarseness - Damage to heart, brain, nerves, lungs, other parts of body or loss of life  Patient voiced  understanding.)    Anesthesia Quick Evaluation

## 2022-03-27 NOTE — Op Note (Signed)
[facility] Patient Name: [name] Procedure Date: 03/27/2022 12:49 PM MRN: [id number] Account #: [id number] Date of Birth: 05-21-1932 Admit Type: Inpatient Age: 88 Room: [facility] ENDO ROOM 1 Gender: Female Note Status: Finalized Instrument Name: Upper Endoscope [id number] Procedure:             Upper GI endoscopy Indications:           Abnormal UGI series Providers:             Andrey Farmer MD, MD Referring MD:          Yvetta Coder. Arnett (Referring MD) Medicines:             Monitored Anesthesia Care Complications:         No immediate complications. Estimated blood loss:                         Minimal. Procedure:             Pre-Anesthesia Assessment:                        - Prior to the procedure, a History and Physical was                         performed, and patient medications and allergies were                         reviewed. The patient is competent. The risks and                         benefits of the procedure and the sedation options and                         risks were discussed with the patient. All questions                         were answered and informed consent was obtained.                         Patient identification and proposed procedure were                         verified by the physician, the nurse, the                         anesthesiologist, the anesthetist and the technician                         in the endoscopy suite. Mental Status Examination:                         alert and oriented. Airway Examination: normal                         oropharyngeal airway and neck mobility. Respiratory                         Examination: clear to auscultation. CV Examination:  normal. Prophylactic Antibiotics: The patient does not                         require prophylactic antibiotics. Prior                         Anticoagulants: The patient has taken no previous                          anticoagulant or antiplatelet agents. ASA Grade                         Assessment: III - A patient with severe systemic                         disease. After reviewing the risks and benefits, the                         patient was deemed in satisfactory condition to                         undergo the procedure. The anesthesia plan was to use                         monitored anesthesia care (MAC). Immediately prior to                         administration of medications, the patient was                         re-assessed for adequacy to receive sedatives. The                         heart rate, respiratory rate, oxygen saturations,                         blood pressure, adequacy of pulmonary ventilation, and                         response to care were monitored throughout the                         procedure. The physical status of the patient was                         re-assessed after the procedure.                        After obtaining informed consent, the endoscope was                         passed under direct vision. Throughout the procedure,                         the patient's blood pressure, pulse, and oxygen                         saturations were monitored continuously. The Endoscope  was introduced through the mouth, and advanced to the                         second part of duodenum. The upper GI endoscopy was                         accomplished without difficulty. The patient tolerated                         the procedure well. Findings:      One benign-appearing, intrinsic severe (stenosis; an endoscope cannot       pass) stenosis was found. This stenosis measured 3 mm (inner diameter) x       less than one cm (in length). The stenosis was traversed with gentle       pressure from scope. A TTS dilator was passed through the scope.       Dilation with a 12-13.5-15 mm balloon dilator was performed to 15 mm. It       appeared that the  initial dilation from passage of the scope broke the       ring as dilation to 15 mm did not greatly improve the luminal opening.       The dilation site was examined and showed moderate mucosal disruption.       Estimated blood loss was minimal.      A small hiatal hernia was present.      The entire examined stomach was normal.      The examined duodenum was normal. Impression:            - Benign-appearing esophageal stenosis. Dilated.                        - Small hiatal hernia.                        - Normal stomach.                        - Normal examined duodenum.                        - No specimens collected. Recommendation:        - Return patient to hospital ward for ongoing care.                        - Diet per speech therapy.                        - Continue present medications.                        - Use a proton pump inhibitor PO daily. Procedure Code(s):     --- Professional ---                        [phone], Esophagogastroduodenoscopy, flexible,                         transoral; with transendoscopic balloon dilation of                         esophagus (less  than 30 mm diameter) Diagnosis Code(s):     --- Professional ---                        K22.2, Esophageal obstruction                        K44.9, Diaphragmatic hernia without obstruction or                         gangrene                        R93.3, Abnormal findings on diagnostic imaging of                         other parts of digestive tract CPT copyright 2019 American [employer] Association. All rights reserved. The codes documented in this report are preliminary and upon coder review may  be revised to meet current compliance requirements. Andrey Farmer MD, MD 03/27/2022 1:26:33 PM Number of Addenda: 0 Note Initiated On: 03/27/2022 12:49 PM Estimated Blood Loss:  Estimated blood loss was minimal.      [facility]

## 2022-03-27 NOTE — Evaluation (Addendum)
Clinical/Bedside Swallow Evaluation Patient Details  Name: ALAN RILES MRN: [id number] Date of Birth: 1932/06/22  Today's Date: 03/27/2022 Time: SLP Start Time (ACUTE ONLY): 1455 SLP Stop Time (ACUTE ONLY): 1540 SLP Time Calculation (min) (ACUTE ONLY): 45 min  Past Medical History:  Past Medical History:  Diagnosis Date   Anxiety    Depression    GERD (gastroesophageal reflux disease)    Hypercalcemia    Pneumonia    Sepsis ([place])    Sleep apnea    SOB (shortness of breath) on exertion    Past Surgical History:  Past Surgical History:  Procedure Laterality Date   ENDOMETRIAL ABLATION     EYE SURGERY  2011   left cataract   KNEE ARTHROSCOPY Left 05/03/2015   Procedure: left knee arthroscopy, partial medial menisectomy, chondroplasty;  Surgeon: Dereck Leep, MD;  Location: [facility] ORS;  Service: Orthopedics;  Laterality: Left;   TONSILLECTOMY     HPI:  88 year old female with past medical history of dementia, stage IIIa chronic kidney disease, previous history of esophageal stricture, depression and obstructive sleep apnea who presented to the emergency room on the night of 8/14 after being brought in by family with concerns for worsening swelling for the past few days as well as confusion.  Patient had a similar presentation several years ago secondary to urinary tract infection.  The emergency room, patient found to have sepsis secondary to urinary tract infection started on IV fluids and antibiotics.  Admitted to the hospital service and GI consulted for Esophageal phase dysphagia, and patient taken for endoscopy on 8/16.  Endoscopy noted significant stenosis from stricture which was able to be dilated.   CXR at admit: no acute abnormality.    Assessment / Plan / Recommendation  Clinical Impression   Pt appears to present w/ adequate oropharyngeal phase swallow w/ No oropharyngeal phase dysphagia noted, No neuromuscular deficits noted. Pt consumed po trials w/ No overt, clinical s/s  of aspiration during po trials. Pt is at reduced risk for aspiration from an oropharyngeal phase standpoint following general aspiration precautions. Pt had a Baseline of Esophageal Dysmotility. DG Esophagus performed revealed: "Limited exam secondary to patient immobility and clinical status. Esophageal dysmotility, likely presbyesophagus. Tiny hiatal hernia with narrowing at the gastroesophageal junction, most likely a mucosal/Schatzki ring.". She had an EGD this admit w/ Dilation. ANY Dysmotility or Regurgitation of Reflux material can increase risk for aspiration of the Reflux material during Retrograde flow thus impact Pulmonary status.  Pt has ill-fitting Dentures at Baseline also. This impacts mastication effectiveness. Family stated they are "chopping" the foods at home.   Pt appears at reduced risk for aspiration during intake following general aspiration precautions. Following REFLUX precautions can help reduce risk for REFLUX and REFLUX aspiration events.  During po trials, pt consumed all consistencies w/ no overt coughing, decline in vocal quality, or change in respiratory presentation during/post trials. O2 sats remained WNL during oral intake, 98%. Oral phase appeared [facility] w/ timely bolus management and control of bolus propulsion for A-P transfer for swallowing. Oral clearing achieved w/ all trial consistencies. Mastication was impacted by ill-fitting Dentures - adhesive used to secure Dentures for eating solid foods. Mastication improved somewhat though Lower Denture plate was still min loose. OM Exam appeared [facility] w/ no unilateral weakness noted. Speech Clear. Pt fed self w/ setup support.   Recommend a more Minced diet consistency diet w/ well-moistened foods for ease of mastication; Thin liquids. Recommend general aspiration and REFLUX precautions, Pills WHOLE  in Puree for safer, easier swallowing. Education given on Pills in Puree; food consistencies and easy to eat options; general  aspiration precautions. NSG to reconsult ST services if any new needs arise. NSG agreed. MD updated.  SLP Visit Diagnosis: Dysphagia, unspecified (R13.10) (esophageal dysmotility)    Aspiration Risk  Mild aspiration risk;Risk for inadequate nutrition/hydration (reduced following precautions)    Diet Recommendation   Per Daughter/family present, a more MINCED foods diet w/ thin liquids via Cup. General aspiration and REFLUX precautions d/t esophageal dysmotility. Tray setup and support at meals d/t overall weakness.  Use Adhesive to secure Dentures d/t ill-fit.   Medication Administration: Whole meds with liquid (vs in Puree)    Other  Recommendations Recommended Consults:  (GI following; Dietician f/u) Oral Care Recommendations: Oral care BID;Oral care before and after PO;Staff/trained caregiver to provide oral care (Denture care) Other Recommendations:  (n/a)    Recommendations for follow up therapy are one component of a multi-disciplinary discharge planning process, led by the attending physician.  Recommendations may be updated based on patient status, additional functional criteria and insurance authorization.  Follow up Recommendations No SLP follow up      Assistance Recommended at Discharge PRN  Functional Status Assessment Patient has had a recent decline in their functional status and demonstrates the ability to make significant improvements in function in a reasonable and predictable amount of time.  Frequency and Duration  (n/a)   (n/a)       Prognosis Prognosis for Safe Diet Advancement: Fair Barriers to Reach Goals: Time post onset;Severity of deficits Barriers/Prognosis Comment: esophageal phase dysmotility      Swallow Study   General Date of Onset: 03/25/22 HPI: 88 year old female with past medical history of dementia, stage IIIa chronic kidney disease, previous history of esophageal stricture, depression and obstructive sleep apnea who presented to the emergency  room on the night of 8/14 after being brought in by family with concerns for worsening swelling for the past few days as well as confusion.  Patient had a similar presentation several years ago secondary to urinary tract infection.  The emergency room, patient found to have sepsis secondary to urinary tract infection started on IV fluids and antibiotics.  Admitted to the hospital service and GI consulted for Esophageal phase dysphagia, and patient taken for endoscopy on 8/16.  Endoscopy noted significant stenosis from stricture which was able to be dilated.   CXR at admit: no acute abnormality. Type of Study: Bedside Swallow Evaluation Previous Swallow Assessment: none Diet Prior to this Study: NPO (chopped foods at home per Dtr present) Temperature Spikes Noted: No Respiratory Status: Room air History of Recent Intubation: No Behavior/Cognition: Alert;Cooperative;Pleasant mood;Distractible;Requires cueing (mild confusion at admit) Oral Cavity Assessment: Within Functional Limits Oral Care Completed by SLP: Recent completion by staff Oral Cavity - Dentition: Dentures, top;Dentures, bottom (ill-fitting -- needed adhesive) Vision: Functional for self-feeding Self-Feeding Abilities: Able to feed self;Needs assist;Needs set up Patient Positioning: Upright in bed (needed positioning) Baseline Vocal Quality: Normal Volitional Cough: Strong Volitional Swallow: Able to elicit    Oral/Motor/Sensory Function Overall Oral Motor/Sensory Function: Within functional limits   Ice Chips Ice chips: Within functional limits   Thin Liquid Thin Liquid: Within functional limits Presentation: Cup;Self Fed (15+ sips)    Nectar Thick Nectar Thick Liquid: Not tested   Honey Thick Honey Thick Liquid: Not tested   Puree Puree: Within functional limits Presentation: Self Fed;Spoon (9 trials)   Solid     Solid: Impaired (min) Presentation: Spoon (  fed; 2 trials) Oral Phase Impairments: Impaired mastication  (munchy; loose bottom dentures) Oral Phase Functional Implications: Impaired mastication;Prolonged oral transit (min) Pharyngeal Phase Impairments:  (none)         Orinda Kenner, MS, CCC-SLP Speech Language Pathologist Rehab Services; [place] [phone] (ascom) Perlita Forbush 03/27/2022,3:56 PM

## 2022-03-27 NOTE — Progress Notes (Signed)
[facility] Progress Note  Patient: [name]    JXB:[id number]  DOA: 03/25/2022    Date of Service: the patient was seen and examined on 03/27/2022  Brief hospital course: 88 year old female with past medical history of dementia, stage IIIa chronic kidney disease, previous history of esophageal stricture, depression and obstructive sleep apnea who presented to the emergency room on the night of 8/14 after being brought in by family with concerns for worsening swelling for the past few days as well as confusion.  Patient had a similar presentation several years ago secondary to urinary tract infection.  The emergency room, patient found to have sepsis secondary to urinary tract infection started on IV fluids and antibiotics.  Admitted to the hospital service and GI consulted for dysphagia, and patient taken for endoscopy on 8/16.  Endoscopy noted significant stenosis from stricture which was able to be dilated.  Assessment and Plan: Assessment and Plan: * Sepsis due to urinary tract infection ([place]) Met criteria given heart rate greater than 90 and respiratory rate greater than 20 plus UTI source.  On IV Rocephin.  Sepsis has since stabilized.  Continue IV fluids.  Following on urine follow urine and blood cultures.  Follow urine and blood cultures.   Esophageal stricture-resolved as of 03/27/2022 Noted on EGD status postdilatation.  Patient had a previous episode more than 20 years ago.  Speech therapy to see.  Dysphagia Secondary to esophageal stricture.  Awaiting speech therapy evaluation   Dementia with behavioral disturbance ([place]) - We will continue Namenda and utilize as needed Haldol.  Hypokalemia-resolved as of 03/27/2022 Replaced according  Vitamin B12 deficiency - We will continue vitamin B12.  Dyslipidemia - We will continue statin therapy  Anxiety and depression - We will continue BuSpar and Lexapro.  Overweight (BMI 25.0-29.9) Meets criteria with BMI greater  than 25       Body mass index is 29.76 kg/m.        Consultants: Gastroenterology  Procedures: Endoscopy 8/16  Antimicrobials: IV Rocephin 8/15-present  Code Status: Full code   Subjective: Patient seen before procedure.  Complains of thirst, feels very worn out, mild headache  Objective: Vital signs were reviewed and unremarkable. Vitals:   03/27/22 1325 03/27/22 1332  BP: 138/84 129/89  Pulse: 87   Resp: 15   Temp:    SpO2: 95%    No intake or output data in the 24 hours ending 03/27/22 1534 Filed Weights   03/26/22 0146 03/27/22 1232  Weight: 81 kg 76.2 kg   Body mass index is 29.76 kg/m.  Exam:  General: Alert and oriented x2, mild distress from anxiety HEENT: Normocephalic, atraumatic, mucous membranes are dry Cardiovascular: Regular rate and rhythm, S1-S2 Respiratory: Clear to auscultation bilaterally Abdomen: Soft, nontender, nondistended, positive bowel sounds Musculoskeletal: No clubbing or cyanosis, trace pitting edema Skin: No skin breaks, tears or lesions Psychiatry: Appropriate, no evidence of psychoses although anxious Neurology: No focal deficits  Data Reviewed: Labs from 8/16 unremarkable.  Urine culture pending  Disposition:  Status is: Inpatient Remains inpatient appropriate because: Speech therapy evaluation    Anticipated discharge date: 8/17  Family Communication: Son at the bedside DVT Prophylaxis: enoxaparin (LOVENOX) injection 40 mg Start: 03/26/22 0800    Author: Annita Brod ,MD 03/27/2022 3:34 PM  To reach On-call, see care teams to locate the attending and reach out via [email or web address]. Between 7PM-7AM, please contact night-coverage If you still have difficulty reaching the attending provider, please page the [facility] (Director on Call)  for [facility] on amion for assistance.

## 2022-03-27 NOTE — Assessment & Plan Note (Signed)
Meets criteria with BMI greater than 25 

## 2022-03-27 NOTE — Assessment & Plan Note (Signed)
Unfortunately unable to see patient on video today.  We essentially had a telephone call .  Expressed my concern with altered mental status and not being safe nor appropriate to evaluate over telephone call especially with patient comorbidities.  She would need emergency evaluation for infection, rule out CVA as well as evaluated dysphagia. Anticipate likely complicated by underlying dementia.  Daughter was very agreeable to going to ED at [facility] today.  I called to give nurse triage report.

## 2022-03-27 NOTE — Assessment & Plan Note (Signed)
Noted on EGD status postdilatation.  Patient had a previous episode more than 20 years ago.  Speech therapy to see.

## 2022-03-27 NOTE — Hospital Course (Addendum)
88 year old female with past medical history of dementia, stage IIIa chronic kidney disease, previous history of esophageal stricture, depression and obstructive sleep apnea who presented to the emergency room on the night of 8/14 after being brought in by family with concerns for worsening swelling for the past few days as well as confusion.  Patient had a similar presentation several years ago secondary to urinary tract infection.  The emergency room, patient found to have sepsis secondary to urinary tract infection started on IV fluids and antibiotics.  Admitted to the hospital service and GI consulted for dysphagia, and patient taken for endoscopy on 8/16.  Endoscopy noted significant stenosis from stricture which was able to be dilated.

## 2022-03-28 ENCOUNTER — Encounter: Payer: Self-pay | Admitting: Gastroenterology

## 2022-03-28 DIAGNOSIS — K222 Esophageal obstruction: Secondary | ICD-10-CM | POA: Diagnosis not present

## 2022-03-28 DIAGNOSIS — A419 Sepsis, unspecified organism: Secondary | ICD-10-CM | POA: Diagnosis not present

## 2022-03-28 DIAGNOSIS — R1319 Other dysphagia: Secondary | ICD-10-CM | POA: Diagnosis not present

## 2022-03-28 DIAGNOSIS — F03918 Unspecified dementia, unspecified severity, with other behavioral disturbance: Secondary | ICD-10-CM | POA: Diagnosis not present

## 2022-03-28 NOTE — Discharge Summary (Signed)
Physician Discharge Summary   Patient: [name] MRN: [id number] DOB: [birth year]  Admit date:     03/25/2022  Discharge date: 03/28/22  Discharge Physician: Annita Brod   PCP: Burnard Hawthorne, FNP   Recommendations at discharge:   Patient being set up with home health PT, OT, RN, speech and language pathology and aide  Discharge Diagnoses: Active Problems:   Dysphagia   Dementia with behavioral disturbance (HCC)   Vitamin B12 deficiency   Anxiety and depression   Dyslipidemia   Overweight (BMI 25.0-29.9)  Principal Problem (Resolved):   Sepsis due to urinary tract infection [facility]) Resolved Problems:   Esophageal stricture   Hypokalemia  Hospital Course: 88 year old female with past medical history of dementia, stage IIIa chronic kidney disease, previous history of esophageal stricture, depression and obstructive sleep apnea who presented to the emergency room on the night of 8/14 after being brought in by family with concerns for worsening swelling for the past few days as well as confusion.  Patient had a similar presentation several years ago secondary to urinary tract infection.  The emergency room, patient found to have sepsis secondary to urinary tract infection started on IV fluids and antibiotics.  Admitted to the hospital service and GI consulted for dysphagia, and patient taken for endoscopy on 8/16.  Endoscopy noted significant stenosis from stricture which was able to be dilated.  Assessment and Plan: * Sepsis due to urinary tract infection (HCC)-resolved as of 03/28/2022 Met criteria given heart rate greater than 90 and respiratory rate greater than 20 plus UTI source.  On IV Rocephin.  Sepsis has since stabilized.  Continue IV fluids.  Following on urine follow urine and blood cultures.  Follow urine and blood cultures. Urine and blood cultures with no growth.  Patient completed 3 days of IV antibiotics and no need for further  Esophageal stricture-resolved  as of 03/27/2022 Noted on EGD status postdilatation.  Patient had a previous episode more than 20 years ago.    Dysphagia Secondary to esophageal stricture.  Plus underlying dementia.  Patient seen by speech therapy who recommended a dysphagia 2 diet with minced food and thin liquids.  Speech therapy through home health will continue to follow patient.  Dementia with behavioral disturbance ([place]) - We will continue Namenda and utilize as needed Haldol.  Hypokalemia-resolved as of 03/27/2022 Replaced accordingly  Vitamin B12 deficiency - We will continue vitamin B12.  Dyslipidemia - We will continue statin therapy  Anxiety and depression - We will continue BuSpar and Lexapro.  Overweight (BMI 25.0-29.9) Meets criteria with BMI greater than 25         Consultants: Gastroenterology Procedures performed: EGD done 8/16 with dilatation of stricture Disposition: Home health Diet recommendation:  Discharge Diet Orders (From admission, onward)     Start     Ordered   03/28/22 0000  DIET DYS 2       Question:  Fluid consistency:  Answer:  Thin   03/28/22 1448           Dysphagia 2 diet with thin liquids DISCHARGE MEDICATION: Allergies as of 03/28/2022       Reactions   Codeine Other (See Comments)   Crying spells   Darvon [propoxyphene Hcl] Other (See Comments)   Crying spells   Propoxyphene Other (See Comments)   Crying, altered mental status        Medication List     TAKE these medications    busPIRone 10 MG tablet Commonly known  as: BUSPAR Take 2 tablets (20 mg total) by mouth at bedtime.   cetirizine 5 MG tablet Commonly known as: ZYRTEC TAKE 1 TABLET(5 MG) BY MOUTH DAILY   cyanocobalamin 1000 MCG tablet Commonly known as: VITAMIN B12 Take 1,000 mcg by mouth daily.   escitalopram 10 MG tablet Commonly known as: LEXAPRO Take 1 tablet (10 mg total) by mouth daily.   famotidine 20 MG tablet Commonly known as: PEPCID TAKE 1 TABLET(20 MG) BY MOUTH  AT BEDTIME   memantine 5 MG tablet Commonly known as: NAMENDA Take 5 mg by mouth 2 (two) times daily.   rosuvastatin 5 MG tablet Commonly known as: Crestor Take 1 tablet (5 mg total) by mouth every evening.   VITAMIN D-3 PO Take 1 capsule by mouth daily. 500iu        Discharge Exam: Filed Weights   03/26/22 0146 03/27/22 1232  Weight: 81 kg 76.2 kg   General: Oriented x1 Cardiovascular: Regular rate and rhythm, S1-S2 Lungs: Clear to auscultation bilaterally  Condition at discharge: improving  The results of significant diagnostics from this hospitalization (including imaging, microbiology, ancillary and laboratory) are listed below for reference.   Imaging Studies: DG ESOPHAGUS W SINGLE CM (SOL OR THIN BA)  Result Date: 03/26/2022 CLINICAL DATA:  spitting food including liquids and solids over the last few days, history of esophageal stricture EXAM: Esophagram TECHNIQUE: Limited single contrast examination was performed using thin liquid barium. This exam was performed by Pasty Spillers, PA-C, and was supervised and interpreted by Abigail Miyamoto, MD. FLUOROSCOPY: Radiation Exposure Index (as provided by the fluoroscopic device): 27.00 mGy Kerma COMPARISON:  None Available. FINDINGS: Swallowing: Delayed. No vestibular penetration or aspiration seen. Pharynx: Unremarkable. Esophagus: No gross abnormality. Esophageal motility: Many tertiary contractions, incomplete esophageal emptying Hiatal Hernia: Tiny, with prominent B-ring. Gastroesophageal reflux: Due to incomplete clearing, it is difficult to identify retropulsed contrast as gastroesophageal reflux Ingested 73m barium tablet: Not given Other: None. IMPRESSION: Limited exam secondary to patient immobility and clinical status. Esophageal dysmotility, likely presbyesophagus. Tiny hiatal hernia with narrowing at the gastroesophageal junction, most likely a mucosal/Schatzki ring. Peptic induced stricture could look similar.  Electronically Signed   By: KAbigail MiyamotoM.D.   On: 03/26/2022 10:26   CT Head Wo Contrast  Result Date: 03/25/2022 CLINICAL DATA:  Mental status change, unknown cause EXAM: CT HEAD WITHOUT CONTRAST TECHNIQUE: Contiguous axial images were obtained from the base of the skull through the vertex without intravenous contrast. RADIATION DOSE REDUCTION: This exam was performed according to the departmental dose-optimization program which includes automated exposure control, adjustment of the mA and/or kV according to patient size and/or use of iterative reconstruction technique. COMPARISON:  CT head 07/20/2020 BRAIN: BRAIN Cerebral ventricle sizes are concordant with the degree of cerebral volume loss-frontal lobe atrophy predominant again noted. Patchy and confluent areas of decreased attenuation are noted throughout the deep and periventricular white matter of the cerebral hemispheres bilaterally, compatible with chronic microvascular ischemic disease. No evidence of large-territorial acute infarction. No parenchymal hemorrhage. No mass lesion. No extra-axial collection. No mass effect or midline shift. No hydrocephalus. Basilar cisterns are patent. Vascular: No hyperdense vessel. Atherosclerotic calcifications are present within the cavernous internal carotid arteries. Skull: No acute fracture or focal lesion. Sinuses/Orbits: Paranasal sinuses and mastoid air cells are clear. Left lens replacement. Otherwise the orbits are unremarkable. Other: None. IMPRESSION: No acute intracranial abnormality. Electronically Signed   By: MIven FinnM.D.   On: 03/25/2022 19:48   DG Chest 2  View  Result Date: 03/25/2022 CLINICAL DATA:  Altered mental status EXAM: CHEST - 2 VIEW COMPARISON:  05/30/2016 FINDINGS: Cardiac shadow is within normal limits. Aortic calcifications are again noted. Lungs are clear bilaterally. Mild degenerative changes of the thoracic spine are noted. IMPRESSION: No acute abnormality noted.  Electronically Signed   By: Inez Catalina M.D.   On: 03/25/2022 19:43    Microbiology: Results for orders placed or performed during the hospital encounter of 03/25/22  Resp Panel by RT-PCR (Flu A&B, Covid) Anterior Nasal Swab     Status: None   Collection Time: 03/25/22  7:02 PM   Specimen: Anterior Nasal Swab  Result Value Ref Range Status   SARS Coronavirus 2 by RT PCR NEGATIVE NEGATIVE Final    Comment: (NOTE) SARS-CoV-2 target nucleic acids are NOT DETECTED.  The SARS-CoV-2 RNA is generally detectable in upper respiratory specimens during the acute phase of infection. The lowest concentration of SARS-CoV-2 viral copies this assay can detect is 138 copies/mL. A negative result does not preclude SARS-Cov-2 infection and should not be used as the sole basis for treatment or other patient management decisions. A negative result may occur with  improper specimen collection/handling, submission of specimen other than nasopharyngeal swab, presence of viral mutation(s) within the areas targeted by this assay, and inadequate number of viral copies(<138 copies/mL). A negative result must be combined with clinical observations, patient history, and epidemiological information. The expected result is Negative.  Fact Sheet for Patients:  EntrepreneurPulse.com.au  Fact Sheet for Healthcare Providers:  IncredibleEmployment.be  This test is no t yet approved or cleared by the [place] FDA and  has been authorized for detection and/or diagnosis of SARS-CoV-2 by FDA under an Emergency Use Authorization (EUA). This EUA will remain  in effect (meaning this test can be used) for the duration of the COVID-19 declaration under Section 564(b)(1) of the Act, 21 U.S.C.section 360bbb-3(b)(1), unless the authorization is terminated  or revoked sooner.       Influenza A by PCR NEGATIVE NEGATIVE Final   Influenza B by PCR NEGATIVE NEGATIVE Final    Comment:  (NOTE) The Xpert Xpress SARS-CoV-2/FLU/RSV plus assay is intended as an aid in the diagnosis of influenza from Nasopharyngeal swab specimens and should not be used as a sole basis for treatment. Nasal washings and aspirates are unacceptable for Xpert Xpress SARS-CoV-2/FLU/RSV testing.  Fact Sheet for Patients: EntrepreneurPulse.com.au  Fact Sheet for Healthcare Providers: IncredibleEmployment.be  This test is not yet approved or cleared by the [place] FDA and has been authorized for detection and/or diagnosis of SARS-CoV-2 by FDA under an Emergency Use Authorization (EUA). This EUA will remain in effect (meaning this test can be used) for the duration of the COVID-19 declaration under Section 564(b)(1) of the Act, 21 U.S.C. section 360bbb-3(b)(1), unless the authorization is terminated or revoked.  Performed at [facility], [address] [place], [place] [address]   Urine Culture     Status: None   Collection Time: 03/26/22  1:20 AM   Specimen: Urine, Clean Catch  Result Value Ref Range Status   Specimen Description   Final    URINE, CLEAN CATCH Performed at [facility], [address], [place], [place] [address]    Special Requests   Final    NONE Performed at [facility], [address] [place], [place] [address]    Culture   Final    NO GROWTH Performed at [facility], [place]  [address], [place], [place] [address]    Report Status 03/27/2022 FINAL  Final    Labs: CBC: Recent Labs  Lab 03/25/22 1909 03/26/22 1248 03/27/22 0439  WBC 6.6 6.0 5.2  NEUTROABS 4.4  --   --   HGB 16.2* 14.9 13.5  HCT 50.9* 46.4* 42.1  MCV 92.5 90.4 91.9  PLT 219 190 431   Basic Metabolic Panel: Recent Labs  Lab 03/25/22 1909 03/26/22 1248 03/27/22 0439  NA 138 141 141  K 3.4* 3.7 3.6  CL 103 108 110  CO2 _0 GLUCOSE 102* 91 77  BUN _1 CREATININE 1.08* 0.90 0.94  CALCIUM 10.5*  9.4 9.2  MG  --  1.9  --    Liver Function Tests: Recent Labs  Lab 03/25/22 1909  AST 19  ALT 12  ALKPHOS 62  BILITOT 1.2  PROT 7.8  ALBUMIN 4.2   CBG: No results for input(s): "GLUCAP" in the last 168 hours.  Discharge time spent: less than 30 minutes.  Signed: Annita Brod, MD [facility] 03/28/2022

## 2022-03-28 NOTE — Evaluation (Signed)
Physical Therapy Evaluation Patient Details Name: [name] MRN: [id number] DOB: [birth year] Today's Date: 03/28/2022  History of Present Illness  Pt is an 88 yo female that presented to ED for swelling, confusion. PMH of dementia, stage IIIa chronic kidney disease, previous history of esophageal stricture, depression and obstructive sleep apnea.   Clinical Impression  Pt alert, oriented to name only, family at bedside provided PLOF. At baseline the patient needs assistance with ADLs, ambulates with a rollator, and occasionally needs steadying in standing. Family performs IADLs and is available 24/7.  The patient did not display any pain signs/symptoms during session, flat affect throughout. Supine to sit with minA via handheld assist (though not necessarily required). Sit <> stand from EOB and from commode, CGA-supervision with RW, pt needed cues for hand placement ea time. She ambulated ~29f total with CGA, no LOB noted.  Overall the patient demonstrated deficits (see "PT Problem List") that impede the patient's functional abilities, safety, and mobility and would benefit from skilled PT intervention. Recommendation at this time is HHPT with 24/7 supervision/assistance to maximize function and safety.        Recommendations for follow up therapy are one component of a multi-disciplinary discharge planning process, led by the attending physician.  Recommendations may be updated based on patient status, additional functional criteria and insurance authorization.  Follow Up Recommendations Home health PT      Assistance Recommended at Discharge Frequent or constant Supervision/Assistance  Patient can return home with the following  A little help with walking and/or transfers;A little help with bathing/dressing/bathroom;Assistance with cooking/housework;Direct supervision/assist for medications management;Assist for transportation;Help with stairs or ramp for entrance    Equipment  Recommendations None recommended by PT  Recommendations for Other Services       Functional Status Assessment Patient has had a recent decline in their functional status and demonstrates the ability to make significant improvements in function in a reasonable and predictable amount of time.     Precautions / Restrictions Precautions Precautions: Fall Restrictions Weight Bearing Restrictions: No      Mobility  Bed Mobility Overal bed mobility: Needs Assistance Bed Mobility: Supine to Sit, Sit to Supine     Supine to sit: HOB elevated, Min assist Sit to supine: Supervision   General bed mobility comments: handheld assist provided but not necessarily required to safely transfer to sitting EOB    Transfers Overall transfer level: Needs assistance Equipment used: Rolling walker (2 wheels) Transfers: Sit to/from Stand Sit to Stand: Supervision           General transfer comment: cued for hand placement    Ambulation/Gait Ambulation/Gait assistance: Supervision, Min guard Gait Distance (Feet):  (151fto bathroom and back, additional 1056fo door and back) Assistive device: Rolling walker (2 wheels)            Stairs            Wheelchair Mobility    Modified Rankin (Stroke Patients Only)       Balance Overall balance assessment: Needs assistance Sitting-balance support: Feet supported, No upper extremity supported   Sitting balance - Comments: able to perform pericare in sitting with supervision   Standing balance support: Single extremity supported Standing balance-Leahy Scale: Fair Standing balance comment: able to assist with donning briefs                             Pertinent Vitals/Pain Pain Assessment Pain Assessment: Faces Faces  Pain Scale: No hurt    Home Living Family/patient expects to be discharged to:: Private residence Living Arrangements: Children Available Help at Discharge: Family;Available 24 hours/day Type of  Home: House Home Access: Stairs to enter Entrance Stairs-Rails: Right;Left;Can reach both Entrance Stairs-Number of Steps: 4-5   Home Layout: One level Home Equipment: Conservation officer, nature (2 wheels);Rollator (4 wheels);Cane - single point      Prior Function Prior Level of Function : Needs assist       Physical Assist : ADLs (physical)   ADLs (physical): Bathing;Dressing;Toileting;IADLs Mobility Comments: normally ambulatory with rollator, intermittently needs hands on assist for steadying when coming up into standing       Hand Dominance        Extremity/Trunk Assessment   Upper Extremity Assessment Upper Extremity Assessment: Generalized weakness    Lower Extremity Assessment Lower Extremity Assessment: Generalized weakness       Communication   Communication: No difficulties  Cognition Arousal/Alertness: Awake/alert Behavior During Therapy: Flat affect Overall Cognitive Status: History of cognitive impairments - at baseline                                 General Comments: pt oriented to name only. per family pt normally oriented to self, place, family, a bit more interactive        General Comments      Exercises     Assessment/Plan    PT Assessment Patient needs continued PT services  PT Problem List Decreased balance;Decreased strength;Decreased activity tolerance;Decreased mobility       PT Treatment Interventions DME instruction;Functional mobility training;Balance training;Patient/family education;Gait training;Therapeutic activities;Neuromuscular re-education;Stair training;Therapeutic exercise    PT Goals (Current goals can be found in the Care Plan section)  Acute Rehab PT Goals Patient Stated Goal: to get her strength up PT Goal Formulation: With family Time For Goal Achievement: 04/11/22 Potential to Achieve Goals: Fair    Frequency Min 2X/week     Co-evaluation               AM-PAC PT "6 Clicks" Mobility   Outcome Measure Help needed turning from your back to your side while in a flat bed without using bedrails?: None Help needed moving from lying on your back to sitting on the side of a flat bed without using bedrails?: A Little Help needed moving to and from a bed to a chair (including a wheelchair)?: A Little Help needed standing up from a chair using your arms (e.g., wheelchair or bedside chair)?: A Little Help needed to walk in hospital room?: A Little Help needed climbing 3-5 steps with a railing? : A Little 6 Click Score: 19    End of Session Equipment Utilized During Treatment: Gait belt Activity Tolerance: Patient tolerated treatment well Patient left: in bed;with bed alarm set;with family/visitor present Nurse Communication: Mobility status PT Visit Diagnosis: Other abnormalities of gait and mobility (R26.89);Muscle weakness (generalized) (M62.81);Difficulty in walking, not elsewhere classified (R26.2)    Time: 5277-8242 PT Time Calculation (min) (ACUTE ONLY): 20 min   Charges:   PT Evaluation $PT Eval Low Complexity: 1 Low PT Treatments $Therapeutic Activity: 8-22 mins        Lieutenant Diego PT, DPT 3:22 PM,03/28/22

## 2022-03-28 NOTE — TOC Initial Note (Signed)
Transition of Care [facility]) - Initial/Assessment Note    Patient Details  Name: [name] MRN: [id number] Date of Birth: [birth year]  [facility] CM/SW Contact:    Alberteen Sam, LCSW Phone Number: 03/28/2022, 2:49 PM  Clinical Narrative:                  Patient is set up with [facility] for PT OT RN aide, SLP.  No further dc needs identified.    Expected Discharge Plan: [place] Barriers to Discharge: Continued Medical Work up   Patient Goals and CMS Choice Patient states their goals for this hospitalization and ongoing recovery are:: to go home CMS Medicare.gov Compare Post Acute Care list provided to:: Patient Choice offered to / list presented to : Patient  Expected Discharge Plan and Services Expected Discharge Plan: [place]       Living arrangements for the past 2 months: Single Family Home Expected Discharge Date: 03/28/22                         HH Arranged: PT, OT, RN, Speech Therapy, Nurse's Aide Duck Najla Aughenbaugh Agency: [facility] ([place]) Date HH Agency Contacted: 03/28/22 Time [place]: 1449 Representative spoke with at [place]: Corene Cornea  Prior Living Arrangements/Services Living arrangements for the past 2 months: [place] with:: Self                   Activities of Daily Living Home Assistive Devices/Equipment: Environmental consultant (specify type), Shower chair with back, Grab bars in shower (bed rails) ADL Screening (condition at time of admission) Patient's cognitive ability adequate to safely complete daily activities?: Yes Is the patient deaf or have difficulty hearing?: Yes Does the patient have difficulty seeing, even when wearing glasses/contacts?: No Does the patient have difficulty concentrating, remembering, or making decisions?: Yes Patient able to express need for assistance with ADLs?: Yes Does the patient have difficulty dressing or bathing?: Yes Independently  performs ADLs?: No Does the patient have difficulty walking or climbing stairs?: Yes Weakness of Legs: Both Weakness of Arms/Hands: Both  Permission Sought/Granted                  Emotional Assessment       Orientation: : Oriented to Self, Oriented to Place, Oriented to  Time, Oriented to Situation Alcohol / Substance Use: Not Applicable Psych Involvement: No (comment)  Admission diagnosis:  Dehydration [E86.0] Other dysphagia [R13.19] Dysphagia [R13.10] Altered mental status, unspecified altered mental status type [R41.82] Patient Active Problem List   Diagnosis Date Noted   Overweight (BMI 25.0-29.9) 03/27/2022   AMS (altered mental status) 03/27/2022   Dysphagia 03/26/2022   Sepsis due to urinary tract infection ([place]) 03/26/2022   Anxiety and depression 03/26/2022   Dyslipidemia 03/26/2022   Vitamin B12 deficiency 03/26/2022   Other specified anxiety disorders 09/21/2021   Atherosclerosis of aorta ([place]) 07/10/2021   Muscle spasm 07/10/2021   Low back pain 06/18/2021   Arthritis of right hip 06/18/2021   Tremor 05/12/2020   High risk medication use 03/30/2020   At risk for prolonged QT interval syndrome 03/30/2020   MDD (major depressive disorder), recurrent, in full remission ([place]) 06/25/2019   Anxiety state 06/25/2019   Bereavement 06/25/2019   Dementia with behavioral disturbance ([place]) 06/25/2019   Lewy body dementia with behavioral disturbance ([place]) 01/09/2018   Vitamin D deficiency 01/09/2018   Dizziness  09/18/2017   Leg cramps 09/18/2017   Osteoporosis 09/18/2017   CKD (chronic kidney disease) stage 3, GFR 30-59 ml/min (HCC) 09/18/2017   Prediabetes 09/18/2017   Hypercalcemia 08/24/2017   Rash 05/30/2016   Leg swelling 05/30/2016   Allergic rhinitis 12/03/2015   UTI symptoms 08/30/2015   Right flank pain 08/30/2015   Hearing loss 06/26/2015   Major depressive disorder with single episode 04/15/2015   OSA (obstructive sleep apnea) 10/27/2014    Left knee pain 10/27/2014   Obstructive apnea 10/27/2014   Urge incontinence of urine 04/22/2014   Incomplete uterine prolapse 04/22/2014   Cystocele 12/29/2013   Onychomycosis 08/09/2013   Dermatophytic onychia 08/09/2013   Primary hyperparathyroidism ([place]) 05/28/2013   Medicare annual wellness visit, subsequent 05/07/2013   Stasis dermatitis 05/07/2013   Varicose veins of lower extremities with inflammation 05/07/2013   Diarrhea 11/11/2012   Closed fracture of humerus, supracondylar 10/09/2012   GERD (gastroesophageal reflux disease) 06/29/2012   Acid reflux 06/29/2012   Depression    PCP:  Burnard Hawthorne, FNP Pharmacy:   [facility] [phone], [place] AT [facility] OF [address] [place] [address] Phone: [phone] Fax: [phone]     Social Determinants of Health (SDOH) Interventions    Readmission Risk Interventions     No data to display

## 2022-03-28 NOTE — Plan of Care (Signed)

## 2022-03-28 NOTE — Anesthesia Postprocedure Evaluation (Signed)
Anesthesia Post Note  Patient: [name]  Procedure(s) Performed: ESOPHAGOGASTRODUODENOSCOPY (EGD) WITH PROPOFOL  Patient location during evaluation: PACU Anesthesia Type: General Level of consciousness: awake and alert Pain management: pain level controlled Vital Signs Assessment: post-procedure vital signs reviewed and stable Respiratory status: spontaneous breathing, nonlabored ventilation and respiratory function stable Cardiovascular status: blood pressure returned to baseline and stable Postop Assessment: no apparent nausea or vomiting Anesthetic complications: no   No notable events documented.   Last Vitals:  Vitals:   03/28/22 0449 03/28/22 0910  BP: 138/77 135/61  Pulse: 83 82  Resp: 19 18  Temp: 36.7 C 36.7 C  SpO2: 96% 99%    Last Pain:  Vitals:   03/28/22 0933  TempSrc:   PainSc: 0-No pain                 Iran Ouch

## 2022-03-28 NOTE — Progress Notes (Signed)
Pt discharged home with family. PIV removed. Discharge instructions reviewed with patient and daughter, verbalize understanding.

## 2022-03-29 ENCOUNTER — Other Ambulatory Visit: Payer: Self-pay | Admitting: *Deleted

## 2022-03-29 ENCOUNTER — Other Ambulatory Visit: Payer: Self-pay

## 2022-03-29 ENCOUNTER — Telehealth: Payer: Self-pay

## 2022-03-29 ENCOUNTER — Telehealth: Payer: Self-pay | Admitting: Family

## 2022-03-29 DIAGNOSIS — G3183 Dementia with Lewy bodies: Secondary | ICD-10-CM

## 2022-03-29 NOTE — Patient Outreach (Signed)
  Care Coordination [facility] Note Transition Care Management Follow-up Telephone Call Date of discharge and from where: 03/28/22 [facility] How have you been since you were released from the hospital? Daughter states that the patient is better but continues to be weak.  Any questions or concerns? Yes, Daughter states that it would be helpful if the patient had a hospital bed. Nurse sent a message to NP Arnett and she is ordering a hospital bed for the patient.   Items Reviewed: Did the pt receive and understand the discharge instructions provided? Yes  Medications obtained and verified? Yes  Other? No  Any new allergies since your discharge? No  Dietary orders reviewed? Yes Do you have support at home? Yes   Home Care and Equipment/Supplies: Were home health services ordered? yes If so, what is the name of the agency? [facility]  Has the agency set up a time to come to the patient's home? no Were any new equipment or medical supplies ordered?  No What is the name of the medical supply agency? N/A Were you able to get the supplies/equipment? not applicable Do you have any questions related to the use of the equipment or supplies? No  Functional Questionnaire: (I = Independent and D = Dependent) ADLs: D  Bathing/Dressing- D  Meal Prep- D  Eating- I  Maintaining continence- D  Transferring/Ambulation- D  Managing Meds- D  Follow up appointments reviewed:  PCP Hospital f/u appt confirmed? Yes  Scheduled to see NP Arnett on 04/17/22 @ 1330. [facility] f/u appt confirmed? No   Are transportation arrangements needed? No  If their condition worsens, is the pt aware to call PCP or go to the Emergency Dept.? Yes Was the patient provided with contact information for the PCP's office or ED? Yes Was to pt encouraged to call back with questions or concerns? Yes  SDOH assessments and interventions completed:   Yes  Care Coordination Interventions Activated:  No   Care  Coordination Interventions:   N/A     Encounter Outcome:  Pt. Visit Completed    Emelia Loron RN, BSN [place] [phone] [email or web address]

## 2022-03-29 NOTE — Telephone Encounter (Signed)
Transition Care Management Unsuccessful Follow-up Telephone Call  Date of discharge and from where:  03/28/22 [facility]  Attempts:  1st Attempt  Reason for unsuccessful TCM follow-up call:  Unable to reach patient. Will follow as appropriate.

## 2022-03-29 NOTE — Progress Notes (Signed)
DME 

## 2022-03-29 NOTE — Telephone Encounter (Signed)
Tiffany from [facility] called wanting to let the provider know that they will start PT on Tuesday

## 2022-03-29 NOTE — Telephone Encounter (Signed)
NOTED

## 2022-04-01 NOTE — Telephone Encounter (Signed)
Transition Care Management Follow-up Telephone Call Date of discharge and from where: 03/28/22 [facility] How have you been since you were released from the hospital? Spoke with daughter, HIPAA compliant, and declines hospital follow up at this time. Family would rather keep the 4 month follow up previously scheduled on 04/17/22. Condition slowly improving. Intermittent diarrhea. Appetite slowly increasing. Denies worsening of harmful symptoms.  Any questions or concerns? No  Items Reviewed: Did the pt receive and understand the discharge instructions provided? Yes  Medications obtained and verified? Yes , daughter assist. Any new allergies since your discharge? No  Dietary orders reviewed? Yes. Dysphagia 2 diet with minced food and thin liquids.  Speech therapy through home health will continue to follow patient Do you have support at home? Yes   Home Care and Equipment/Supplies: Were home health services ordered? Yes Not yet in process.   Functional Questionnaire: (I = Independent and D = Dependent) ADLs: Family assist  Follow up appointments reviewed:  PCP Hospital f/u appt confirmed? Requests to keep scheduled follow up on 04/17/22. Are transportation arrangements needed? No  If their condition worsens, is the pt aware to call PCP or go to the Emergency Dept.? Yes Was the patient provided with contact information for the PCP's office or ED? Yes Was to pt encouraged to call back with questions or concerns? Yes

## 2022-04-02 ENCOUNTER — Telehealth: Payer: Self-pay | Admitting: Family

## 2022-04-02 NOTE — Telephone Encounter (Signed)
[name] patient's daughter called, [phone]. Patient has developed a bed sore on rt hip. No appointments until 8/24. Patient has been scheduled for 8/24 at 2:30pm.

## 2022-04-02 NOTE — Telephone Encounter (Signed)
[name] would like to know how to treat until patient can be seen.

## 2022-04-03 DIAGNOSIS — N1831 Chronic kidney disease, stage 3a: Secondary | ICD-10-CM | POA: Diagnosis not present

## 2022-04-03 DIAGNOSIS — E663 Overweight: Secondary | ICD-10-CM | POA: Diagnosis not present

## 2022-04-03 DIAGNOSIS — G4733 Obstructive sleep apnea (adult) (pediatric): Secondary | ICD-10-CM | POA: Diagnosis not present

## 2022-04-03 DIAGNOSIS — L89321 Pressure ulcer of left buttock, stage 1: Secondary | ICD-10-CM | POA: Diagnosis not present

## 2022-04-03 DIAGNOSIS — R1310 Dysphagia, unspecified: Secondary | ICD-10-CM | POA: Diagnosis not present

## 2022-04-03 DIAGNOSIS — E785 Hyperlipidemia, unspecified: Secondary | ICD-10-CM | POA: Diagnosis not present

## 2022-04-03 DIAGNOSIS — N390 Urinary tract infection, site not specified: Secondary | ICD-10-CM | POA: Diagnosis not present

## 2022-04-03 DIAGNOSIS — F0284 Dementia in other diseases classified elsewhere, unspecified severity, with anxiety: Secondary | ICD-10-CM | POA: Diagnosis not present

## 2022-04-03 DIAGNOSIS — E538 Deficiency of other specified B group vitamins: Secondary | ICD-10-CM | POA: Diagnosis not present

## 2022-04-03 DIAGNOSIS — A4150 Gram-negative sepsis, unspecified: Secondary | ICD-10-CM | POA: Diagnosis not present

## 2022-04-03 DIAGNOSIS — F0283 Dementia in other diseases classified elsewhere, unspecified severity, with mood disturbance: Secondary | ICD-10-CM | POA: Diagnosis not present

## 2022-04-03 DIAGNOSIS — F32A Depression, unspecified: Secondary | ICD-10-CM | POA: Diagnosis not present

## 2022-04-03 DIAGNOSIS — K219 Gastro-esophageal reflux disease without esophagitis: Secondary | ICD-10-CM | POA: Diagnosis not present

## 2022-04-03 DIAGNOSIS — G309 Alzheimer's disease, unspecified: Secondary | ICD-10-CM | POA: Diagnosis not present

## 2022-04-03 DIAGNOSIS — K449 Diaphragmatic hernia without obstruction or gangrene: Secondary | ICD-10-CM | POA: Diagnosis not present

## 2022-04-04 ENCOUNTER — Encounter: Payer: Self-pay | Admitting: Family

## 2022-04-04 ENCOUNTER — Ambulatory Visit
Admission: RE | Admit: 2022-04-04 | Discharge: 2022-04-04 | Disposition: A | Discharge status: Home / Self Care | Payer: Medicare Other | Source: Ambulatory Visit | Attending: Family | Admitting: Family

## 2022-04-04 ENCOUNTER — Ambulatory Visit: Payer: Medicare Other | Admitting: Family

## 2022-04-04 VITALS — BP 118/70 | HR 86 | Temp 98.9°F | Ht 63.0 in | Wt 167.4 lb

## 2022-04-04 DIAGNOSIS — L89151 Pressure ulcer of sacral region, stage 1: Secondary | ICD-10-CM

## 2022-04-04 DIAGNOSIS — R195 Other fecal abnormalities: Secondary | ICD-10-CM | POA: Diagnosis not present

## 2022-04-04 DIAGNOSIS — R197 Diarrhea, unspecified: Secondary | ICD-10-CM | POA: Diagnosis not present

## 2022-04-04 DIAGNOSIS — M7989 Other specified soft tissue disorders: Secondary | ICD-10-CM | POA: Insufficient documentation

## 2022-04-04 MED ORDER — FAMOTIDINE 40 MG PO TABS: 40.0000 mg | ORAL_TABLET | Freq: Every day | ORAL | 1 refills | Status: AC

## 2022-04-04 NOTE — Assessment & Plan Note (Signed)
Pending stat stool study after recent abx, hospitalization

## 2022-04-04 NOTE — Telephone Encounter (Signed)
Patient has appt in office today

## 2022-04-04 NOTE — Progress Notes (Signed)
Subjective:    Patient ID: [name], female    DOB: 05-12-1932, 88 y.o.   MRN: [id number]  CC: [name] is a 88 y.o. female who presents today for hospital follow up.   HPI: Accompanied by daughter and son  Complains of right ankle swelling and redness. No injury and noticed today. No fall  She also has lesion on sacrum and concerned for pressure ulcer.   She has had loose brown stool x 6 days.  Occurring 3 episodes per day.   She has started imodium with every episode.  Intermittent abdominal cramping.  No fever, n, dysuria, epigastric burning.   She is taking pepcid '20mg'$  qd.   She had sausage and egg 'roll up' for breakfast, and nibble of salisbury steak.        Presented to the emergency room acute onset altered mental status with confusion.  Concern for UTI and dysphagia. Patient was admitted for dysphagia, dementia with behavioral disturbances from 03/25/2022 to 03/28/2022. Hospital course significant for  Sepsis due to urinary tract infection.  Esophageal stricture noted on EGD status post dilatation of.  Speech therapy through home health will follow patient.  Haldol used prn hospitalization  Dementia-compliant with Namenda  Crt 0.94 Upper endoscopy 03/17/2022, Dr. Haig Prophet with intrinsic severe stenosis.  No specimens collected CT  head negative for acute finding.  Cerebral volume loss frontal lobe atrophy again noted, chronic microvascular ischemic disease   HISTORY:  Past Medical History:  Diagnosis Date   Anxiety    Depression    GERD (gastroesophageal reflux disease)    Hypercalcemia    Pneumonia    Sepsis ([place])    Sleep apnea    SOB (shortness of breath) on exertion    Past Surgical History:  Procedure Laterality Date   ENDOMETRIAL ABLATION     ESOPHAGOGASTRODUODENOSCOPY (EGD) WITH PROPOFOL N/A 03/27/2022   Procedure: ESOPHAGOGASTRODUODENOSCOPY (EGD) WITH PROPOFOL;  Surgeon: Lesly Rubenstein, MD;  Location: [facility] ENDOSCOPY;  Service:  Endoscopy;  Laterality: N/A;   EYE SURGERY  2011   left cataract   KNEE ARTHROSCOPY Left 05/03/2015   Procedure: left knee arthroscopy, partial medial menisectomy, chondroplasty;  Surgeon: Dereck Leep, MD;  Location: [facility] ORS;  Service: Orthopedics;  Laterality: Left;   TONSILLECTOMY     Family History  Problem Relation Age of Onset   Cancer Mother 36       ovarian    Pernicious anemia Mother    COPD Father        nonsmoker   Cancer Cousin        ovarian ca    Allergies: Codeine, Darvon [propoxyphene hcl], and Propoxyphene Current Outpatient Medications on File Prior to Visit  Medication Sig Dispense Refill   busPIRone (BUSPAR) 10 MG tablet Take 2 tablets (20 mg total) by mouth at bedtime. 180 tablet 3   cetirizine (ZYRTEC) 5 MG tablet TAKE 1 TABLET(5 MG) BY MOUTH DAILY 90 tablet 1   Cholecalciferol (VITAMIN D-3 PO) Take 1 capsule by mouth daily. 500iu     escitalopram (LEXAPRO) 10 MG tablet Take 1 tablet (10 mg total) by mouth daily. 90 tablet 3   memantine (NAMENDA) 5 MG tablet Take 5 mg by mouth 2 (two) times daily.     rosuvastatin (CRESTOR) 5 MG tablet Take 1 tablet (5 mg total) by mouth every evening. 90 tablet 3   vitamin B-12 (CYANOCOBALAMIN) 1000 MCG tablet Take 1,000 mcg by mouth daily.     No current  facility-administered medications on file prior to visit.    Social History   Tobacco Use   Smoking status: Never   Smokeless tobacco: Never  Vaping Use   Vaping Use: Never used  Substance Use Topics   Alcohol use: No   Drug use: No    Review of Systems  Constitutional:  Negative for chills and fever.  Respiratory:  Negative for cough and shortness of breath.   Cardiovascular:  Positive for leg swelling. Negative for chest pain and palpitations.  Gastrointestinal:  Positive for abdominal pain (cramping) and diarrhea. Negative for nausea and vomiting.  Genitourinary:  Negative for dysuria.      Objective:    BP 118/70 (BP Location: Left Arm, Patient  Position: Sitting, Cuff Size: Normal)   Pulse 86   Temp 98.9 F (37.2 C) (Oral)   Ht '5\' 3"'$  (1.6 m)   Wt 167 lb 6.4 oz (75.9 kg)   SpO2 96%   BMI 29.65 kg/m  BP Readings from Last 3 Encounters:  04/04/22 118/70  03/28/22 135/61  11/02/21 118/68   Wt Readings from Last 3 Encounters:  04/04/22 167 lb 6.4 oz (75.9 kg)  03/27/22 168 lb (76.2 kg)  11/02/21 177 lb 9.6 oz (80.6 kg)    Physical Exam Vitals reviewed.  Constitutional:      Appearance: Normal appearance. She is well-developed.  Eyes:     Conjunctiva/sclera: Conjunctivae normal.  Cardiovascular:     Rate and Rhythm: Normal rate and regular rhythm.     Pulses: Normal pulses.     Heart sounds: Normal heart sounds.     Comments: Right ankle +1 nonpitting edema noted.  No erythema or tenderness.  No edema noted bilateral calves Pulmonary:     Effort: Pulmonary effort is normal.     Breath sounds: Normal breath sounds. No wheezing, rhonchi or rales.  Abdominal:     General: Bowel sounds are normal. There is no distension.     Palpations: Abdomen is soft. Abdomen is not rigid. There is no fluid wave or mass.     Tenderness: There is no abdominal tenderness. There is no guarding or rebound.  Skin:    General: Skin is warm and dry.          Comments: Sacral stage I pressure ulcer approximately 3 cm.  Nonblanchable.  Skin is intact.  No red streaks  Neurological:     Mental Status: She is alert.  Psychiatric:        Speech: Speech normal.        Behavior: Behavior normal.        Thought Content: Thought content normal.        Assessment & Plan:   Problem List Items Addressed This Visit       Other   Diarrhea    At risk for C. difficile colitis after IV Rocephin, hospitalization.  Reviewed hospital course with family and patient today. medications reconciled.  GI pathogen panel is negative.  Appetite is fair.  Advised to start probiotics, Metamucil and to hold Imodium until GI pathogen has returned.  Question if  intermittent abdominal cramping perhaps related to GERD.  Patient is poor historian.  Trial increase of Pepcid AC to 40 mg twice daily. Close follow up.       Loose stools    Pending stat stool study after recent abx, hospitalization      Relevant Medications   famotidine (PEPCID) 40 MG tablet   Other Relevant Orders   GI  pathogen panel by PCR, stool   Pressure ulcer, sacrum    Stage I sacral pressure ulcer.  Discussed with family prevention techniques including utilizing hospital bed ( checking on status), turning every 2 hours and using pillow to prop patient on left or right side to relieve pressure from sacral area at this time.  Discussed barrier creams.  Concerned that diarrhea has likely exacerbated.  Close follow-up.      Right ankle swelling - Primary    Etiology unclear at this time.  No reported injury.  Question presentation been asymmetric in the setting of immobilization, recent hospitalization.  Fortunately ultrasound Doppler study negative for DVT.  Will monitor        I have discontinued Kabria M. Bulls's famotidine. I am also having her start on famotidine. Additionally, I am having her maintain her Cholecalciferol (VITAMIN D-3 PO), cyanocobalamin, escitalopram, rosuvastatin, busPIRone, cetirizine, and memantine.   Meds ordered this encounter  Medications   famotidine (PEPCID) 40 MG tablet    Sig: Take 1 tablet (40 mg total) by mouth daily.    Dispense:  90 tablet    Refill:  1    Order Specific Question:   Supervising Provider    Answer:   Crecencio Mc [[id number]]    Return precautions given.   Risks, benefits, and alternatives of the medications and treatment plan prescribed today were discussed, and patient expressed understanding.   Education regarding symptom management and diagnosis given to patient on AVS.  Continue to follow with Burnard Hawthorne, FNP for routine health maintenance.   [name] and I agreed with plan.   [name],  FNP

## 2022-04-04 NOTE — Patient Instructions (Addendum)
Please start desitin as barrier cream and it is imperative to move your mother every 2 hours from side to side to relieve pressure from sacrum and prevent further breakdown.   Start probiotics due to diarrhea and recent antibiotics for at least 2 weeks.    This can either be by eating yogurt daily or taking a probiotic supplement over the counter such as Culturelle.It is important to re-colonize the gut with good bacteria and also to prevent any diarrheal infections associated with antibiotic use.   Start metamucil which can help bulk the stool.   Please hold on imodium until we have stool result back.   Hydration is the most important and most certainly if she is unable to stay hydrated or appears weak , she would need to return to the emergency room  Return stool specimen to [facility]  We will consider remeron to increase appetite and continue to discuss  Increase pepcid from '20mg'$  to '40mg'$   Go directly now to [facility] for right ultrasound.  We will call you if results are positive.

## 2022-04-05 ENCOUNTER — Telehealth: Payer: Self-pay | Admitting: Family

## 2022-04-05 ENCOUNTER — Other Ambulatory Visit
Admission: RE | Admit: 2022-04-05 | Discharge: 2022-04-05 | Disposition: A | Discharge status: Home / Self Care | Payer: Medicare Other | Source: Ambulatory Visit | Attending: Family | Admitting: Family

## 2022-04-05 DIAGNOSIS — R195 Other fecal abnormalities: Secondary | ICD-10-CM | POA: Diagnosis not present

## 2022-04-05 LAB — GASTROINTESTINAL PANEL BY PCR, STOOL (REPLACES STOOL CULTURE)

## 2022-04-05 NOTE — Telephone Encounter (Signed)
Patient's daughter [name] called, [phone]. She has questions about her mother getting a hospital bed, what is the cost for patient , if any. When it may come.  Also, should patient be using a cushion for bed sores.

## 2022-04-08 DIAGNOSIS — L89159 Pressure ulcer of sacral region, unspecified stage: Secondary | ICD-10-CM | POA: Insufficient documentation

## 2022-04-08 NOTE — Telephone Encounter (Signed)
Spoke to [place] patients daughter and informed her that the order had been placed and informed them of the next steps and what they needed to do on their end.

## 2022-04-08 NOTE — Assessment & Plan Note (Signed)
Stage I sacral pressure ulcer.  Discussed with family prevention techniques including utilizing hospital bed ( checking on status), turning every 2 hours and using pillow to prop patient on left or right side to relieve pressure from sacral area at this time.  Discussed barrier creams.  Concerned that diarrhea has likely exacerbated.  Close follow-up.

## 2022-04-08 NOTE — Telephone Encounter (Signed)
Please call daughter What is status of hospital bed?  Has patient been able to get?

## 2022-04-08 NOTE — Assessment & Plan Note (Signed)
At risk for C. difficile colitis after IV Rocephin, hospitalization.  Reviewed hospital course with family and patient today. medications reconciled.  GI pathogen panel is negative.  Appetite is fair.  Advised to start probiotics, Metamucil and to hold Imodium until GI pathogen has returned.  Question if intermittent abdominal cramping perhaps related to GERD.  Patient is poor historian.  Trial increase of Pepcid AC to 40 mg twice daily. Close follow up.

## 2022-04-08 NOTE — Assessment & Plan Note (Signed)
Etiology unclear at this time.  No reported injury.  Question presentation been asymmetric in the setting of immobilization, recent hospitalization.  Fortunately ultrasound Doppler study negative for DVT.  Will monitor

## 2022-04-12 NOTE — Telephone Encounter (Signed)
Sandi a Education officer, museum from [employer] called stating pt daughter wants to know about an update on pt bed. I spoke to the daughter who was there with [facility] and I read the message that Domingo Mend did talk to the daughter on Monday and told her that the order was placed and the daughter does not know what they need to do on their end.

## 2022-04-12 NOTE — Telephone Encounter (Signed)
Sandy from [employer] called back to let Arnett know that no further social work services are needed at this time.

## 2022-04-17 ENCOUNTER — Ambulatory Visit (INDEPENDENT_AMBULATORY_CARE_PROVIDER_SITE_OTHER): Payer: Medicare Other | Admitting: Family

## 2022-04-17 ENCOUNTER — Encounter: Payer: Self-pay | Admitting: Family

## 2022-04-17 VITALS — BP 120/78 | HR 80 | Temp 98.7°F | Ht 63.0 in | Wt 162.8 lb

## 2022-04-17 DIAGNOSIS — F419 Anxiety disorder, unspecified: Secondary | ICD-10-CM | POA: Diagnosis not present

## 2022-04-17 DIAGNOSIS — R251 Tremor, unspecified: Secondary | ICD-10-CM

## 2022-04-17 DIAGNOSIS — F03918 Unspecified dementia, unspecified severity, with other behavioral disturbance: Secondary | ICD-10-CM | POA: Diagnosis not present

## 2022-04-17 DIAGNOSIS — R195 Other fecal abnormalities: Secondary | ICD-10-CM

## 2022-04-17 DIAGNOSIS — F02818 Dementia in other diseases classified elsewhere, unspecified severity, with other behavioral disturbance: Secondary | ICD-10-CM

## 2022-04-17 DIAGNOSIS — G3183 Dementia with Lewy bodies: Secondary | ICD-10-CM

## 2022-04-17 DIAGNOSIS — F32A Depression, unspecified: Secondary | ICD-10-CM

## 2022-04-17 MED ORDER — QUETIAPINE FUMARATE 25 MG PO TABS: 25.0000 mg | ORAL_TABLET | Freq: Every day | ORAL | 1 refills | Status: DC

## 2022-04-17 NOTE — Assessment & Plan Note (Signed)
Appears improved.  However remain concern with patient's poor appetite, weight loss.  Pending fecal calprotectin.  Encouraged Metamucil to bulk stool.

## 2022-04-17 NOTE — Patient Instructions (Addendum)
If loose stool persist, please let me know certainly do not want your mother become dehydrated.    Also ordered further stool studies if you would return those to our office.     To bulk stool, you may try Metamucil 3.4 g daily up to 3 times daily as needed.  We will await labs, urine studies to ensure stable.  I have also added Seroquel '25mg'$  at bedtime.

## 2022-04-17 NOTE — Progress Notes (Signed)
Subjective:    Patient ID: GENOVA KINER, female    DOB: 02/20/1932, 88 y.o.   MRN: [id number]  CC: FIDELA CIESLAK is a 88 y.o. female who presents today for follow up.   HPI: Accompanied by daughter  She is concerned that mother is having visual and auditory hallucinations, waxes and wanes. She seems more paranoid. Patient states today ' I'm afraid I'm going to use the D word. '  Daughter reports that she can be erratic regarding compliance of medication as well as eating and drinking.  This appears to wax and wane.  Worse in the evening.   She is no longer on Abilify.   Sleep can be erratic, some nights she has trouble falling asleep.   Sacral pressure ulcer has improved.   Right ankle swelling has resolved.   Loose stools has improved regarding frequency. Episodic diarrhea now. She has one watery brown stool yesterday. May occur randomly. She is using imodium.   No dysuria, abdominal pain.   She has lost 5 lbs.        Compliant with Pepcid 40 mg twice daily Negative GI pathogen panel 04/06/2019 Right leg ultrasound negative for deep vein thrombosis 04/04/2022 CT head  03/25/2022 acute abnormality 05/10/2022, follow-up scheduled with neurology, Dr. Manuella Ghazi  HISTORY:  Past Medical History:  Diagnosis Date   Anxiety    Depression    GERD (gastroesophageal reflux disease)    Hypercalcemia    Pneumonia    Sepsis ([place])    Sleep apnea    SOB (shortness of breath) on exertion    Past Surgical History:  Procedure Laterality Date   ENDOMETRIAL ABLATION     ESOPHAGOGASTRODUODENOSCOPY (EGD) WITH PROPOFOL N/A 03/27/2022   Procedure: ESOPHAGOGASTRODUODENOSCOPY (EGD) WITH PROPOFOL;  Surgeon: Lesly Rubenstein, MD;  Location: [facility] ENDOSCOPY;  Service: Endoscopy;  Laterality: N/A;   EYE SURGERY  2011   left cataract   KNEE ARTHROSCOPY Left 05/03/2015   Procedure: left knee arthroscopy, partial medial menisectomy, chondroplasty;  Surgeon: Dereck Leep, MD;  Location: [facility] ORS;   Service: Orthopedics;  Laterality: Left;   TONSILLECTOMY     Family History  Problem Relation Age of Onset   Cancer Mother 64       ovarian    Pernicious anemia Mother    COPD Father        nonsmoker   Cancer Cousin        ovarian ca    Allergies: Codeine, Darvon [propoxyphene hcl], and Propoxyphene Current Outpatient Medications on File Prior to Visit  Medication Sig Dispense Refill   busPIRone (BUSPAR) 10 MG tablet Take 2 tablets (20 mg total) by mouth at bedtime. 180 tablet 3   cetirizine (ZYRTEC) 5 MG tablet TAKE 1 TABLET(5 MG) BY MOUTH DAILY 90 tablet 1   Cholecalciferol (VITAMIN D-3 PO) Take 1 capsule by mouth daily. 500iu     escitalopram (LEXAPRO) 10 MG tablet Take 1 tablet (10 mg total) by mouth daily. 90 tablet 3   famotidine (PEPCID) 40 MG tablet Take 1 tablet (40 mg total) by mouth daily. 90 tablet 1   memantine (NAMENDA) 5 MG tablet Take 5 mg by mouth 2 (two) times daily.     rosuvastatin (CRESTOR) 5 MG tablet Take 1 tablet (5 mg total) by mouth every evening. 90 tablet 3   vitamin B-12 (CYANOCOBALAMIN) 1000 MCG tablet Take 1,000 mcg by mouth daily.     No current facility-administered medications on file prior to visit.  Social History   Tobacco Use   Smoking status: Never   Smokeless tobacco: Never  Vaping Use   Vaping Use: Never used  Substance Use Topics   Alcohol use: No   Drug use: No    Review of Systems  Constitutional:  Negative for chills and fever.  Respiratory:  Negative for cough.   Cardiovascular:  Negative for chest pain and palpitations.  Gastrointestinal:  Negative for nausea and vomiting.  Psychiatric/Behavioral:  Positive for confusion, hallucinations and sleep disturbance.       Objective:    BP 120/78 (BP Location: Left Arm, Patient Position: Sitting, Cuff Size: Normal)   Pulse 80   Temp 98.7 F (37.1 C) (Oral)   Ht '5\' 3"'$  (1.6 m)   Wt 162 lb 12.8 oz (73.8 kg)   SpO2 96%   BMI 28.84 kg/m  BP Readings from Last 3  Encounters:  04/17/22 120/78  04/04/22 118/70  03/28/22 135/61   Wt Readings from Last 3 Encounters:  04/17/22 162 lb 12.8 oz (73.8 kg)  04/04/22 167 lb 6.4 oz (75.9 kg)  03/27/22 168 lb (76.2 kg)    Physical Exam Vitals reviewed.  Constitutional:      Appearance: She is well-developed.  Eyes:     Conjunctiva/sclera: Conjunctivae normal.  Cardiovascular:     Rate and Rhythm: Normal rate and regular rhythm.     Pulses: Normal pulses.     Heart sounds: Normal heart sounds.  Pulmonary:     Effort: Pulmonary effort is normal.     Breath sounds: Normal breath sounds. No wheezing, rhonchi or rales.  Skin:    General: Skin is warm and dry.  Neurological:     Mental Status: She is alert.  Psychiatric:        Attention and Perception: She does not perceive auditory or visual hallucinations.        Mood and Affect: Affect is flat.        Speech: Speech normal.        Behavior: Behavior is cooperative.        Thought Content: Thought content normal.     Comments: Appropriately dressed, well groomed        Assessment & Plan:   Problem List Items Addressed This Visit       Nervous and Auditory   Dementia with behavioral disturbance (HCC)   Relevant Medications   QUEtiapine (SEROQUEL) 25 MG tablet   Other Relevant Orders   CBC with Differential/Platelet   Comprehensive metabolic panel   TSH   S49 and Folate Panel   Lewy body dementia with behavioral disturbance ([place])    Concern for progression of dementia in setting of depression.  Patient upcoming follow-up neurology.  She is somewhat compliant with memantine '5mg'$  BID. Will follow.         Other   Anxiety and depression - Primary   Relevant Medications   QUEtiapine (SEROQUEL) 25 MG tablet   Depression    Daughter is primary historian today.  Patient is not appropriately answering questions. Affect is flat.  No witnessed auditory or visual hallucinations.    I consulted with Dr. Shea Evans via secure chat.  We jointly  agreed obtaining basic labs, urine studies to ensure stable and no evidence of infection.  I updated EKG to ensure stable without QT prolongation.  EKG shows sinus rhythm with no acute changes when compared to previous EKG 03/25/2022. T wave inversion stable from prior. EKG QT 396.  Dr. Shea Evans and  I jointly agreed to start Seroquel 25 mg nightly with close follow-up.      Loose stools    Appears improved.  However remain concern with patient's poor appetite, weight loss.  Pending fecal calprotectin.  Encouraged Metamucil to bulk stool.      Relevant Orders   Urinalysis, Routine w reflex microscopic   Urine Culture   Calprotectin, Fecal   Tremor   Relevant Orders   EKG 12-Lead     I am having Ensley M. Guinyard start on QUEtiapine. I am also having her maintain her Cholecalciferol (VITAMIN D-3 PO), cyanocobalamin, escitalopram, rosuvastatin, busPIRone, cetirizine, memantine, and famotidine.   Meds ordered this encounter  Medications   QUEtiapine (SEROQUEL) 25 MG tablet    Sig: Take 1 tablet (25 mg total) by mouth at bedtime.    Dispense:  30 tablet    Refill:  1    Order Specific Question:   Supervising Provider    Answer:   Crecencio Mc [[id number]]    Return precautions given.   Risks, benefits, and alternatives of the medications and treatment plan prescribed today were discussed, and patient expressed understanding.   Education regarding symptom management and diagnosis given to patient on AVS.  Continue to follow with Burnard Hawthorne, FNP for routine health maintenance.   [name] and I agreed with plan.   [name], FNP  I have spent 40 minutes with a patient including precharting, exam, reviewing medical records, consulting with psychiatry, [name] and discussion plan of care.

## 2022-04-17 NOTE — Assessment & Plan Note (Addendum)
Daughter is primary historian today.  Patient is not appropriately answering questions. Affect is flat.  No witnessed auditory or visual hallucinations.    I consulted with Dr. Shea Evans via secure chat.  We jointly agreed obtaining basic labs, urine studies to ensure stable and no evidence of infection.  I updated EKG to ensure stable without QT prolongation.  EKG shows sinus rhythm with no acute changes when compared to previous EKG 03/25/2022. T wave inversion stable from prior. EKG QT 396.  Dr. Shea Evans and I jointly agreed to start Seroquel 25 mg nightly with close follow-up.

## 2022-04-17 NOTE — Assessment & Plan Note (Signed)
Concern for progression of dementia in setting of depression.  Patient upcoming follow-up neurology.  She is somewhat compliant with memantine '5mg'$  BID. Will follow.

## 2022-04-17 NOTE — Telephone Encounter (Signed)
noted 

## 2022-04-18 LAB — CBC WITH DIFFERENTIAL/PLATELET
Basophils Absolute: 0.1 10*3/uL (ref 0.0–0.1)
Basophils Relative: 1 % (ref 0.0–3.0)
Eosinophils Absolute: 0.2 10*3/uL (ref 0.0–0.7)
Eosinophils Relative: 2.4 % (ref 0.0–5.0)
HCT: 44.5 % (ref 36.0–46.0)
Hemoglobin: 14.6 g/dL (ref 12.0–15.0)
Lymphocytes Relative: 24.8 % (ref 12.0–46.0)
Lymphs Abs: 1.8 10*3/uL (ref 0.7–4.0)
MCHC: 32.9 g/dL (ref 30.0–36.0)
MCV: 90 fl (ref 78.0–100.0)
Monocytes Absolute: 0.6 10*3/uL (ref 0.1–1.0)
Monocytes Relative: 8.2 % (ref 3.0–12.0)
Neutro Abs: 4.7 10*3/uL (ref 1.4–7.7)
Neutrophils Relative %: 63.6 % (ref 43.0–77.0)
Platelets: 258 10*3/uL (ref 150.0–400.0)
RBC: 4.94 Mil/uL (ref 3.87–5.11)
RDW: 14.8 % (ref 11.5–15.5)
WBC: 7.3 10*3/uL (ref 4.0–10.5)

## 2022-04-18 LAB — URINALYSIS, ROUTINE W REFLEX MICROSCOPIC
Bilirubin Urine: NEGATIVE
Ketones, ur: NEGATIVE
Nitrite: NEGATIVE
Specific Gravity, Urine: 1.025 (ref 1.000–1.030)
Total Protein, Urine: NEGATIVE
Urine Glucose: NEGATIVE
Urobilinogen, UA: 0.2 (ref 0.0–1.0)
pH: 6 (ref 5.0–8.0)

## 2022-04-18 LAB — URINE CULTURE
MICRO NUMBER:: 13879254
SPECIMEN QUALITY:: ADEQUATE

## 2022-04-18 LAB — B12 AND FOLATE PANEL
Folate: 7.8 ng/mL (ref >5.9)
Vitamin B-12: 877 pg/mL (ref 211–911)

## 2022-04-18 LAB — COMPREHENSIVE METABOLIC PANEL
ALT: 9 U/L (ref 0–35)
AST: 17 U/L (ref 0–37)
Albumin: 3.7 g/dL (ref 3.5–5.2)
Alkaline Phosphatase: 65 U/L (ref 39–117)
BUN: 9 mg/dL (ref 6–23)
CO2: 29 mEq/L (ref 19–32)
Calcium: 10.4 mg/dL (ref 8.4–10.5)
Chloride: 102 mEq/L (ref 96–112)
Creatinine, Ser: 1.05 mg/dL (ref 0.40–1.20)
GFR: 47.58 mL/min — ABNORMAL LOW (ref >60.00)
Glucose, Bld: 79 mg/dL (ref 70–99)
Potassium: 3.6 mEq/L (ref 3.5–5.1)
Sodium: 142 mEq/L (ref 135–145)
Total Bilirubin: 0.7 mg/dL (ref 0.2–1.2)
Total Protein: 6.9 g/dL (ref 6.0–8.3)

## 2022-04-18 LAB — TSH: TSH: 3.1 u[IU]/mL (ref 0.35–5.50)

## 2022-04-22 DIAGNOSIS — R195 Other fecal abnormalities: Secondary | ICD-10-CM | POA: Diagnosis not present

## 2022-04-22 NOTE — Addendum Note (Signed)
Addended by: Leeanne Rio on: 04/22/2022 12:41 PM   Modules accepted: Orders

## 2022-04-23 ENCOUNTER — Telehealth: Payer: Self-pay | Admitting: Family

## 2022-04-23 ENCOUNTER — Other Ambulatory Visit: Payer: Self-pay

## 2022-04-23 ENCOUNTER — Ambulatory Visit (INDEPENDENT_AMBULATORY_CARE_PROVIDER_SITE_OTHER): Payer: Medicare Other | Admitting: Psychiatry

## 2022-04-23 ENCOUNTER — Encounter: Payer: Self-pay | Admitting: Psychiatry

## 2022-04-23 VITALS — BP 127/70 | HR 83 | Ht 63.0 in | Wt 161.0 lb

## 2022-04-23 DIAGNOSIS — F02818 Dementia in other diseases classified elsewhere, unspecified severity, with other behavioral disturbance: Secondary | ICD-10-CM

## 2022-04-23 DIAGNOSIS — G3183 Dementia with Lewy bodies: Secondary | ICD-10-CM

## 2022-04-23 DIAGNOSIS — F3342 Major depressive disorder, recurrent, in full remission: Secondary | ICD-10-CM

## 2022-04-23 DIAGNOSIS — R399 Unspecified symptoms and signs involving the genitourinary system: Secondary | ICD-10-CM

## 2022-04-23 DIAGNOSIS — R4182 Altered mental status, unspecified: Secondary | ICD-10-CM | POA: Diagnosis not present

## 2022-04-23 DIAGNOSIS — F418 Other specified anxiety disorders: Secondary | ICD-10-CM

## 2022-04-23 NOTE — Progress Notes (Unsigned)
[facility] Progress Note  04/23/2022 12:57 PM [name]  MRN:  [id number]  Chief Complaint:  Chief Complaint  Patient presents with   Follow-up   Altered Mental Status   Hallucinations   HPI: [name] is a 88 year old Caucasian female with history of MDD, dementia, gastroesophageal reflux disease, obstructive sleep apnea on CPAP, osteoporosis, recent admission to medical floor-for urinary tract infection/altered mental status, presented for medication management.  Patient as well as daughter-[name] participated in the evaluation in the office.  Son [name] joined on the phone.  Patient with recent admission-03/25/2022-released 03/28/2022-reviewed notes per Dr. Otilio Saber with dysphagia, urinary tract infection with sepsis-treated with IV Rocephin, sepsis stabilized.  Patient also with esophageal stricture-resolved as of 03/27/2022-EGD status post dilatation.  Patient had previous episode more than 20 years ago.  Patient with dementia with behavioral disturbances, was advised to continue Namenda and to utilize as needed Haldol.'  Patient being a limited historian and daughter provided information today-Per daughter patient continues to be confused since her recent admission to the hospital.  This is an acute change since hospitalization.  She has spells of being completely confused, talking about irrelevant things, likely having hallucinations, having paranoia that somebody is out to get her and her daughter and she needs to protect herself, refuses to take food or medicine episodically throughout the day.  Patient hence has not been taking her medications on a regular basis, sleep is also disrupted since she does not have a good sleep hygiene and stays up late, and sleeps in late.  This has been going on since the past several weeks.  Prior to her hospital admission on 03/25/2022-she was not confused per daughter.  Patient today in session could not tell the day, the date, month or the  year.  Patient appeared to have trouble with her attention and concentration, could not spell the word " WORLD" backward.  She also seemed to have significant problems with her immediate ,recent memory.  Patient could not give any history, could not answer questions appropriately in session majority of the time.  Patient appeared to be anxious throughout the session.  She had trouble drawing a clock, was able to put the numbers in but could not put the time.  Patient was started on Seroquel last week, writer had coordinated care with her primary care provider.  However per daughter they did not pick it up from the pharmacy yet, however will contact pharmacy today and let writer know.  Patient did not express any suicidality or homicidality.   Visit Diagnosis: R/O Delirium    ICD-10-CM   1. MDD (major depressive disorder), recurrent, in full remission ([place])  F33.42     2. Lewy body dementia with behavioral disturbance (HCC)  G31.83    F02.818     3. Other specified anxiety disorders  F41.8    Limited symptom attacks    4. Altered mental status, unspecified altered mental status type  R41.82       Past Psychiatric History: Reviewed past psychiatric history from progress note on 01/04/2020.  Past Medical History:  Past Medical History:  Diagnosis Date   Anxiety    Depression    GERD (gastroesophageal reflux disease)    Hypercalcemia    Pneumonia    Sepsis (HCC)    Sleep apnea    SOB (shortness of breath) on exertion     Past Surgical History:  Procedure Laterality Date   ENDOMETRIAL ABLATION     ESOPHAGOGASTRODUODENOSCOPY (EGD)  WITH PROPOFOL N/A 03/27/2022   Procedure: ESOPHAGOGASTRODUODENOSCOPY (EGD) WITH PROPOFOL;  Surgeon: Lesly Rubenstein, MD;  Location: [facility] END[facility];  Service: Endoscopy;  Laterality: N/A;   EYE SURGERY  2011   left cataract   KNEE ARTHROSCOPY Left 05/03/2015   Procedure: left knee arthroscopy, partial medial menisectomy, chondroplasty;  Surgeon: Dereck Leep, MD;  Location: [facility] ORS;  Service: Orthopedics;  Laterality: Left;   TONSILLECTOMY      Family Psychiatric History: Reviewed family psychiatric history from progress note on 01/04/2020.  Family History:  Family History  Problem Relation Age of Onset   Cancer Mother 24       ovarian    Pernicious anemia Mother    COPD Father        nonsmoker   Cancer Cousin        ovarian ca    Social History: Reviewed social history from progress note on 01/04/2020. Social History   Socioeconomic History   Marital status: Widowed    Spouse name: Not on file   Number of children: 5   Years of education: Not on file   Highest education level: Not on file  Occupational History   Occupation: retired  Tobacco Use   Smoking status: Never   Smokeless tobacco: Never  Vaping Use   Vaping Use: Never used  Substance and Sexual Activity   Alcohol use: No   Drug use: No   Sexual activity: Not Currently  Other Topics Concern   Not on file  Social History Narrative   Regular exercise: not really   Caffeine use: some   Daughter lives with her.    Social Determinants of Health   Financial Resource Strain: Low Risk  (06/29/2021)   Overall Financial Resource Strain (CARDIA)    Difficulty of Paying Living Expenses: Not hard at all  Food Insecurity: No Food Insecurity (06/29/2021)   Hunger Vital Sign    Worried About Running Out of Food in the Last Year: Never true    Ran Out of Food in the Last Year: Never true  Transportation Needs: No Transportation Needs (06/29/2021)   PRAPARE - Hydrologist (Medical): No    Lack of Transportation (Non-Medical): No  Physical Activity: Unknown (06/28/2020)   Exercise Vital Sign    Days of Exercise per Week: 0 days    Minutes of Exercise per Session: Not on file  Stress: No Stress Concern Present (06/29/2021)   [place]    Feeling of Stress : Not at all   Social Connections: Moderately Isolated (06/29/2021)   Social Connection and Isolation Panel [NHANES]    Frequency of Communication with Friends and Family: Once a week    Frequency of Social Gatherings with Friends and Family: More than three times a week    Attends Religious Services: More than 4 times per year    Active Member of [employer] or Organizations: No    Attends Archivist Meetings: Never    Marital Status: Widowed    Allergies:  Allergies  Allergen Reactions   Codeine Other (See Comments)    Crying spells   Darvon [Propoxyphene Hcl] Other (See Comments)    Crying spells   Propoxyphene Other (See Comments)    Crying, altered mental status    Metabolic Disorder Labs: Lab Results  Component Value Date   HGBA1C 6.0 08/21/2017   Lab Results  Component Value Date   PROLACTIN 11.9 05/12/2020  Lab Results  Component Value Date   CHOL 166 02/19/2021   TRIG 139.0 02/19/2021   HDL 44.40 02/19/2021   CHOLHDL 4 02/19/2021   VLDL 27.8 02/19/2021   LDLCALC 93 02/19/2021   LDLCALC 120 (H) 05/12/2020   Lab Results  Component Value Date   TSH 3.10 04/17/2022   TSH 3.19 09/10/2021    Therapeutic Level Labs: No results found for: "LITHIUM" No results found for: "VALPROATE" No results found for: "CBMZ"  Current Medications: Current Outpatient Medications  Medication Sig Dispense Refill   busPIRone (BUSPAR) 10 MG tablet Take 2 tablets (20 mg total) by mouth at bedtime. 180 tablet 3   cetirizine (ZYRTEC) 5 MG tablet TAKE 1 TABLET(5 MG) BY MOUTH DAILY 90 tablet 1   Cholecalciferol (VITAMIN D-3 PO) Take 1 capsule by mouth daily. 500iu     escitalopram (LEXAPRO) 10 MG tablet Take 1 tablet (10 mg total) by mouth daily. 90 tablet 3   famotidine (PEPCID) 40 MG tablet Take 1 tablet (40 mg total) by mouth daily. 90 tablet 1   memantine (NAMENDA) 5 MG tablet Take 5 mg by mouth 2 (two) times daily.     rosuvastatin (CRESTOR) 5 MG tablet Take 1 tablet (5 mg total) by  mouth every evening. 90 tablet 3   vitamin B-12 (CYANOCOBALAMIN) 1000 MCG tablet Take 1,000 mcg by mouth daily.     QUEtiapine (SEROQUEL) 25 MG tablet Take 1 tablet (25 mg total) by mouth at bedtime. (Patient not taking: Reported on 04/23/2022) 30 tablet 1   No current facility-administered medications for this visit.     Musculoskeletal: Strength & Muscle Tone: within normal limits Gait & Station:  walks with walker Patient leans: Front  Psychiatric Specialty Exam: Review of Systems  Unable to perform ROS: Mental status change    Blood pressure 127/70, pulse 83, height '5\' 3"'$  (1.6 m), weight 161 lb (73 kg).Body mass index is 28.52 kg/m.  General Appearance: Casual  Eye Contact:  Fair  Speech:  Normal Rate  Volume:  Normal  Mood:   unable to verbalize  Affect:   anxious  Thought Process:  Irrelevant and Descriptions of Associations: Loose  Orientation:  Other:  person, situation  Thought Content: Delusions, Hallucinations: Visual, and Paranoid Ideation   Suicidal Thoughts:   did not express  Homicidal Thoughts:   did not express  Memory:  Immediate;   Poor Recent;   Poor Remote;   Poor  Judgement:  Poor  Insight:  Shallow  Psychomotor Activity:  Normal  Concentration:  Concentration: Poor and Attention Span: Poor  Recall:  Poor  Fund of Knowledge: Poor  Language: Fair  Akathisia:  No  Handed:  Right  AIMS (if indicated): not done  Assets:  Housing Social Support Transportation  ADL's:  Intact  Cognition: Impaired,  Moderate  Sleep:   restless   Screenings: Administrator, Civil Service Office Visit from 01/03/2022 in [place] Visit from 06/16/2017 in [place] Office Visit from 02/07/2016 in [place] Total Score 0 0 0      [place] Office Visit from 04/17/2022 in [place] Office Visit from 04/04/2022 in [facility] Video Visit from 03/25/2022 in [facility]  Total GAD-7 Score 0 0 [place] Office Visit from 10/09/2015 in [place]  Total  Score (max 30 points ) 27      PHQ2-9    [place] Visit from 04/17/2022 in [facility] Office Visit from 04/04/2022 in [facility] Video Visit from 03/25/2022 in [facility] Office Visit from 01/03/2022 in [place] from 06/29/2021 in [place]  PHQ-2 Total Score 5 0 0 0 0  PHQ-9 Total Score 5 0 -- -- --      Flowsheet Row ED to Hosp-Admission (Discharged) from 03/25/2022 in [place] Office Visit from 01/03/2022 in [place] Video Visit from 03/23/2021 in [name] No Risk No Risk Error: Question 1 not populated        Assessment and Plan: [name] is a 88 year old Caucasian female, lives in [place], has a history of depression, dementia, anxiety disorder, recent hospital admission on 03/25/2022 - 03/28/2022-for altered mental status, urinary tract infection with sepsis and dysphagia, presented with continued confusion, refusal to take medications as well as take her meals, will benefit from the following plan.  Plan MDD-in remission Continue Lexapro 10 mg p.o. daily  Other specified anxiety disorder-unstable Likely due to her altered mental status, hallucinations Will continue BuSpar 10 mg p.o. daily. We will start Seroquel 25 mg p.o. nightly-daughter to pick it up from pharmacy.  Altered mental status-with comorbid history of dementia-rule out delirium-unstable Patient had recent workup done including CMP-04/17/2022-GFR low at 47.5  otherwise within normal limits-patient was advised to follow up with  nephrology, TSH-within normal limits, CBC with differential-within normal limits,  Urine Analysis-W BC-positive, hemoglobin-trace, urine culture-likely contaminated growth-patient was advised to repeat urine analysis per primary care provider.  Will start Seroquel 25 mg p.o. nightly for sleep, anxiety and psychosis.  However discussed that patient may need evaluation for admission if her condition tends to get worse or if she refuses to take medications or her meals.  Plan discussed with patient's daughter as well as son who was in session today.   Follow-up in clinic as needed.   This note was generated in part or whole with voice recognition software. Voice recognition is usually quite accurate but there are transcription errors that can and very often do occur. I apologize for any typographical errors that were not detected and corrected.     Ursula Alert, MD 04/24/2022, 11:05 AM

## 2022-04-23 NOTE — Progress Notes (Signed)
orders

## 2022-04-23 NOTE — Patient Instructions (Signed)
Delirium Delirium is a state of mental confusion. It comes on quickly and causes significant changes in a person's thinking and behavior. People with delirium usually have trouble paying attention to what is going on or knowing where they are. They may become very withdrawn or very emotional and unable to sit still. They may even see or feel things that are not there (hallucinations). Delirium is a sign of a serious underlying medical condition. What are the causes? Delirium occurs when something suddenly affects the signals that the brain sends out. Brain signals can be affected by anything that puts severe stress on the body and brain and causes brain chemicals to be out of balance. The most common causes of delirium include: Infections. These may be bacterial, viral, fungal, or protozoal. Medicines. These include many over-the-counter and prescription medicines. Recreational drugs. Substance withdrawal. This occurs with sudden discontinuation of alcohol, certain medicines, or recreational drugs. Surgery and anesthesia. Sudden vascular events, such as stroke and brain hemorrhage. Other brain disorders, such as migraines, tumors, seizures, and physical head trauma. Metabolic disorders, such as kidney or liver failure. Low blood oxygen (anoxia). This may occur with lung disease, cardiac arrest, or carbon monoxide poisoning. Hormone imbalances (endocrinopathies), such as an overactive thyroid (hyperthyroidism) or underactive thyroid (hypothyroidism). Vitamin deficiencies. What increases the risk? The following factors may make someone more likely to develop this condition: Being a child. Being an older person. Living alone. Having vision loss or hearing loss. Having an existing brain disease, such as dementia. Having long-lasting (chronic) medical conditions, such as heart disease. Being hospitalized for long periods of time. What are the signs or symptoms? Delirium starts with a sudden  change in a person's thinking or behavior. Symptoms include: Not being able to stay awake (drowsiness) or pay attention. Being confused about places, time, and people. Forgetfulness. Having extreme energy levels. These may be low or high. Changes in sleep patterns. Extreme mood swings, such as sudden anger or anxiety. Focusing on things or ideas that are not important. Rambling and senseless talking. Difficulty speaking, understanding speech, or both. Hallucinations. Tremor or unsteady gait. Symptoms come and go throughout the day and are often worse at the end of the day. How is this diagnosed? People with delirium may not realize that they have the condition. Often, a family member or health care provider is the first person to notice the changes. This condition may be diagnosed based on a physical exam, health history, and tests. The health care provider will obtain a detailed history. This may include questions about: Current symptoms. Medical conditions that you have. Medicines. Drug use. The health care provider will perform a mental status test by: Asking questions to check for confusion. Watching for abnormal behavior. The health care provider may also order lab tests or additional studies to determine the cause of the delirium. How is this treated? Treatment of delirium depends on the cause and severity. Delirium usually goes away within days or weeks of treating the underlying cause. In the meantime, do not leave the person alone because he or she may accidentally cause self-harm. This condition may be treated with supportive care, such as: Increased light during the day and decreased light at night. Low noise level. Uninterrupted sleep. A regular daily schedule. Clocks and calendars to help with orientation. Familiar objects, including the person's pictures and clothing. Frequent visits from familiar family and friends. A healthy diet. Gentle exercise. In more severe  cases of delirium, medicine may be prescribed to help the   person keep calm and think more clearly. Follow these instructions at home:  Continue supportive care as told by a health care provider. Take over-the-counter and prescription medicines only as told by your health care provider. Ask a health care provider before using herbs or supplements. Do not use alcohol or illegal drugs. Keep all follow-up visits. This is important. Contact a health care provider if: Symptoms do not get better or they become worse. New symptoms of delirium develop. Caring for the person at home does not seem safe. Eating, drinking, or communicating stops. There are side effects of medicines, such as changes in sleep patterns, dizziness, weight gain, restlessness, movement changes, or tremors. Get help right away if: The person has thoughts of harming self or harming others. There are serious side effects of medicine, such as: Swelling of the face, lips, tongue, or throat. Fever, confusion, muscle spasms, or seizures. If you ever feel like a loved one may hurt himself or herself or others, or shares thoughts about taking his or her own life, get help right away. You can go to your nearest emergency department or: Call your local emergency services (911 in the [place] Call a suicide crisis helpline, such as the National Suicide Prevention Lifeline at 1-800-273-8255 or 988 in the [place] This is open 24 hours a day in the [place] Text the Crisis Text Line at 741741 (in the [place] Summary Delirium is a state of mental confusion. It comes on quickly and causes significant changes in a person's thinking and behavior. Delirium is a sign of a serious underlying medical condition. Certain medical conditions or a long hospital stay may increase the risk of developing delirium. Treatment of delirium involves treating the underlying cause and providing supportive treatments, such as a calm and familiar environment. This  information is not intended to replace advice given to you by your health care provider. Make sure you discuss any questions you have with your health care provider. Document Revised: 02/21/2021 Document Reviewed: 11/05/2019 Elsevier Patient Education  2023 Elsevier Inc.  

## 2022-04-23 NOTE — Telephone Encounter (Signed)
Spoke to patients daughter [name] and informed her that labs are in order and she could just schedule the 1 mnth f/up when she comes I tomorrow 04/24/22

## 2022-04-23 NOTE — Progress Notes (Signed)
Gave daughter information when they were here for last visit and order has been placed for bed already!

## 2022-04-23 NOTE — Telephone Encounter (Signed)
Patient's daughter called and said that [facility] sent her a message to bring patient back to re-do urine. Do lab orders need to be in for a re-do? Patient coming in tomorrow afternoon, 04/24/2022.

## 2022-04-24 ENCOUNTER — Other Ambulatory Visit (INDEPENDENT_AMBULATORY_CARE_PROVIDER_SITE_OTHER): Payer: Medicare Other

## 2022-04-24 DIAGNOSIS — R399 Unspecified symptoms and signs involving the genitourinary system: Secondary | ICD-10-CM | POA: Diagnosis not present

## 2022-04-24 LAB — CALPROTECTIN, FECAL: Calprotectin, Fecal: 24 ug/g (ref 0–120)

## 2022-04-24 NOTE — Progress Notes (Signed)
Spoke to patients daughter and scheduled appt for next month

## 2022-04-24 NOTE — Addendum Note (Signed)
Addended by: Leeanne Rio on: 04/24/2022 10:39 AM   Modules accepted: Orders

## 2022-04-25 LAB — URINALYSIS, ROUTINE W REFLEX MICROSCOPIC
Bilirubin Urine: NEGATIVE
Ketones, ur: NEGATIVE
Nitrite: NEGATIVE
Specific Gravity, Urine: 1.015 (ref 1.000–1.030)
Total Protein, Urine: NEGATIVE
Urine Glucose: NEGATIVE
Urobilinogen, UA: 0.2 (ref 0.0–1.0)
pH: 6 (ref 5.0–8.0)

## 2022-04-26 ENCOUNTER — Telehealth: Payer: Self-pay | Admitting: Family

## 2022-04-26 DIAGNOSIS — R8290 Unspecified abnormal findings in urine: Secondary | ICD-10-CM

## 2022-04-26 NOTE — Telephone Encounter (Signed)
Can I add urine culture?  I have ordered

## 2022-04-26 NOTE — Telephone Encounter (Signed)
Call patient Unfortunately urine culture was not collected when urinalysis was.  She will need to come back in for urine culture.  Please apologize to patient and family for this.  Please ensure she follows clean-catch protocol as she had quite amount of white blood cells in her urine I want to ensure that she does not have infection  Please reorder UA and urine culture

## 2022-04-26 NOTE — Telephone Encounter (Signed)
Pt will need to recollect. It is over 91 hours old.

## 2022-04-29 ENCOUNTER — Telehealth: Payer: Self-pay | Admitting: Family

## 2022-04-29 NOTE — Telephone Encounter (Signed)
Daughter, [name] called about urine culture results/urine test results. Please call her at [phone]

## 2022-04-30 ENCOUNTER — Other Ambulatory Visit (INDEPENDENT_AMBULATORY_CARE_PROVIDER_SITE_OTHER): Payer: Medicare Other

## 2022-04-30 ENCOUNTER — Other Ambulatory Visit: Payer: Self-pay

## 2022-04-30 DIAGNOSIS — R399 Unspecified symptoms and signs involving the genitourinary system: Secondary | ICD-10-CM

## 2022-04-30 DIAGNOSIS — R8290 Unspecified abnormal findings in urine: Secondary | ICD-10-CM | POA: Diagnosis not present

## 2022-04-30 NOTE — Addendum Note (Signed)
Addended by: Neta Ehlers on: 04/30/2022 04:22 PM   Modules accepted: Orders

## 2022-04-30 NOTE — Telephone Encounter (Signed)
Spoke to patients daughter and informed her that they needed to come back in to give another urine sample at their earliest convienience because urinalysis was NOT collected

## 2022-04-30 NOTE — Progress Notes (Signed)
ur

## 2022-05-01 ENCOUNTER — Telehealth: Payer: Self-pay

## 2022-05-01 DIAGNOSIS — R8290 Unspecified abnormal findings in urine: Secondary | ICD-10-CM | POA: Diagnosis not present

## 2022-05-01 DIAGNOSIS — R399 Unspecified symptoms and signs involving the genitourinary system: Secondary | ICD-10-CM | POA: Diagnosis not present

## 2022-05-01 NOTE — Telephone Encounter (Signed)
Malachy Mood is calling from [facility] to state patient's daughter Shamell Hittle is requesting patient discontinue home health nursing, she would like to continue physical therapy and speech therapy.  Malachy Mood states Letitia Libra states there are a lot of people coming in and out of the house and it wears the patient out.  Malachy Mood states [facility] nurse has been taking patient's vitals.

## 2022-05-01 NOTE — Telephone Encounter (Signed)
noted 

## 2022-05-01 NOTE — Telephone Encounter (Signed)
Spoke to daughter [name] in regards to her Mom results and let her know that the urine culture and UA was not collected so they would have to come back up her to give another urine sample. [name] made an appt to come on 04/30/22.

## 2022-05-02 LAB — URINALYSIS, ROUTINE W REFLEX MICROSCOPIC
Bilirubin Urine: NEGATIVE
Hgb urine dipstick: NEGATIVE
Ketones, ur: NEGATIVE
Leukocytes,Ua: NEGATIVE
Nitrite: NEGATIVE
RBC / HPF: NONE SEEN (ref 0–?)
Specific Gravity, Urine: <=1.005 — AB (ref 1.000–1.030)
Total Protein, Urine: NEGATIVE
Urine Glucose: NEGATIVE
Urobilinogen, UA: 0.2 (ref 0.0–1.0)
pH: 6 (ref 5.0–8.0)

## 2022-05-02 LAB — URINE CULTURE
MICRO NUMBER:: 13944079
SPECIMEN QUALITY:: ADEQUATE

## 2022-05-03 DIAGNOSIS — E538 Deficiency of other specified B group vitamins: Secondary | ICD-10-CM | POA: Diagnosis not present

## 2022-05-03 DIAGNOSIS — E663 Overweight: Secondary | ICD-10-CM | POA: Diagnosis not present

## 2022-05-03 DIAGNOSIS — L89321 Pressure ulcer of left buttock, stage 1: Secondary | ICD-10-CM | POA: Diagnosis not present

## 2022-05-03 DIAGNOSIS — K219 Gastro-esophageal reflux disease without esophagitis: Secondary | ICD-10-CM | POA: Diagnosis not present

## 2022-05-03 DIAGNOSIS — G4733 Obstructive sleep apnea (adult) (pediatric): Secondary | ICD-10-CM | POA: Diagnosis not present

## 2022-05-03 DIAGNOSIS — F0283 Dementia in other diseases classified elsewhere, unspecified severity, with mood disturbance: Secondary | ICD-10-CM | POA: Diagnosis not present

## 2022-05-03 DIAGNOSIS — F0284 Dementia in other diseases classified elsewhere, unspecified severity, with anxiety: Secondary | ICD-10-CM | POA: Diagnosis not present

## 2022-05-03 DIAGNOSIS — N390 Urinary tract infection, site not specified: Secondary | ICD-10-CM | POA: Diagnosis not present

## 2022-05-03 DIAGNOSIS — K449 Diaphragmatic hernia without obstruction or gangrene: Secondary | ICD-10-CM | POA: Diagnosis not present

## 2022-05-03 DIAGNOSIS — G309 Alzheimer's disease, unspecified: Secondary | ICD-10-CM | POA: Diagnosis not present

## 2022-05-03 DIAGNOSIS — A4150 Gram-negative sepsis, unspecified: Secondary | ICD-10-CM | POA: Diagnosis not present

## 2022-05-03 DIAGNOSIS — R1310 Dysphagia, unspecified: Secondary | ICD-10-CM | POA: Diagnosis not present

## 2022-05-03 DIAGNOSIS — F32A Depression, unspecified: Secondary | ICD-10-CM | POA: Diagnosis not present

## 2022-05-03 DIAGNOSIS — N1831 Chronic kidney disease, stage 3a: Secondary | ICD-10-CM | POA: Diagnosis not present

## 2022-05-03 DIAGNOSIS — E785 Hyperlipidemia, unspecified: Secondary | ICD-10-CM | POA: Diagnosis not present

## 2022-05-06 ENCOUNTER — Telehealth: Payer: Self-pay

## 2022-05-06 NOTE — Telephone Encounter (Signed)
It could be multifactorial like we discussed given her recent history of infection and in some patients especially elderly patients these kind of symptoms can linger several weeks to months.  It could be complicated by the fact that she also has dementia.

## 2022-05-06 NOTE — Telephone Encounter (Signed)
called left message since the urine test was negative what has caused her mother altered mental status

## 2022-05-06 NOTE — Telephone Encounter (Signed)
daughter was given the information per dr. Shea Evans advice.

## 2022-05-10 DIAGNOSIS — R2689 Other abnormalities of gait and mobility: Secondary | ICD-10-CM | POA: Diagnosis not present

## 2022-05-10 DIAGNOSIS — F22 Delusional disorders: Secondary | ICD-10-CM | POA: Diagnosis not present

## 2022-05-10 DIAGNOSIS — G4733 Obstructive sleep apnea (adult) (pediatric): Secondary | ICD-10-CM | POA: Diagnosis not present

## 2022-05-10 DIAGNOSIS — G3183 Dementia with Lewy bodies: Secondary | ICD-10-CM | POA: Diagnosis not present

## 2022-05-17 ENCOUNTER — Ambulatory Visit: Payer: Medicare Other | Admitting: Family

## 2022-05-21 ENCOUNTER — Telehealth: Payer: Self-pay

## 2022-05-21 ENCOUNTER — Encounter: Payer: Self-pay | Admitting: Psychiatry

## 2022-05-21 ENCOUNTER — Ambulatory Visit: Payer: Medicare Other | Admitting: Psychiatry

## 2022-05-21 VITALS — BP 117/55 | HR 77 | Temp 97.3°F | Ht 63.0 in | Wt 161.0 lb

## 2022-05-21 DIAGNOSIS — Z9189 Other specified personal risk factors, not elsewhere classified: Secondary | ICD-10-CM

## 2022-05-21 DIAGNOSIS — G3183 Dementia with Lewy bodies: Secondary | ICD-10-CM | POA: Diagnosis not present

## 2022-05-21 DIAGNOSIS — F418 Other specified anxiety disorders: Secondary | ICD-10-CM | POA: Diagnosis not present

## 2022-05-21 DIAGNOSIS — F3342 Major depressive disorder, recurrent, in full remission: Secondary | ICD-10-CM

## 2022-05-21 DIAGNOSIS — R4182 Altered mental status, unspecified: Secondary | ICD-10-CM

## 2022-05-21 DIAGNOSIS — F02818 Dementia in other diseases classified elsewhere, unspecified severity, with other behavioral disturbance: Secondary | ICD-10-CM

## 2022-05-21 MED ORDER — ESCITALOPRAM OXALATE 10 MG PO TABS: 10.0000 mg | ORAL_TABLET | Freq: Every day | ORAL | 3 refills | Status: AC

## 2022-05-21 MED ORDER — QUETIAPINE FUMARATE 50 MG PO TABS: 50.0000 mg | ORAL_TABLET | Freq: Every day | ORAL | 1 refills | Status: DC

## 2022-05-21 NOTE — Patient Instructions (Signed)
Please call for [id number] 

## 2022-05-21 NOTE — Telephone Encounter (Signed)
received fax that the quetiapine '50mg'$  need a prior auth.

## 2022-05-21 NOTE — Progress Notes (Unsigned)
[name] Progress Note  05/21/2022 1:00 PM [name]  MRN:  [id number]  Chief Complaint:  Chief Complaint  Patient presents with   Follow-up   Anxiety   Paranoid   Hallucinations   Memory Loss   HPI: [name] is a 88 year old Caucasian female with history of MDD, dementia, gastroesophageal reflux disease, obstructive sleep apnea on CPAP, osteoporosis, recent admission to medical floor for urinary tract infection/altered mental status, presented for medication management.  Collateral information obtained from daughter [name] who was present in office , son [name] -participated by phone.  Patient is a limited historian.  Could not answer questions asked except for giving her date of birth .  Patient appeared to be confused, agitated at times anxious.  Patient was unable to give the date, unable to give her address, unable to give the place.  However appeared to be oriented to situation.  Per daughter patient continues to have episodes of confusion, appears to be hallucinating when she talks to someone beside her about taking her medications, appears to be delusional and paranoid often.  She usually gets more confused at the end of the day likely due to dementia, sundowning.  Patient had appointment with neurology recently and did not make any changes with her medications rather advised her to continue the same.  Patient has been taking the Seroquel with some assistance at night.  There are days when she finds it however is able to take it most of the time.  Denies any side effects to it at this time.  Patient did not express any suicidality or homicidality.  Per daughter patient seems to have improved significantly from the UTI, and currently does not have any symptoms from it.    Visit Diagnosis:    ICD-10-CM   1. MDD (major depressive disorder), recurrent, in full remission ([place])  F33.42 EKG 12-Lead    QUEtiapine (SEROQUEL) 50 MG tablet    escitalopram (LEXAPRO) 10 MG tablet     2. Lewy body dementia with behavioral disturbance ([facility])  G31.83 EKG 12-Lead   F02.818 QUEtiapine (SEROQUEL) 50 MG tablet    3. Other specified anxiety disorders  F41.8    Limited symptom attacks    4. Altered mental status, unspecified altered mental status type  R41.82     5. At risk for prolonged QT interval syndrome  Z91.89 EKG 12-Lead      Past Psychiatric History: Reviewed past psychiatric history from progress note on 01/04/2020.  Past Medical History:  Past Medical History:  Diagnosis Date   Anxiety    Depression    GERD (gastroesophageal reflux disease)    Hypercalcemia    Pneumonia    Sepsis ([place])    Sleep apnea    SOB (shortness of breath) on exertion     Past Surgical History:  Procedure Laterality Date   ENDOMETRIAL ABLATION     ESOPHAGOGASTRODUODENOSCOPY (EGD) WITH PROPOFOL N/A 03/27/2022   Procedure: ESOPHAGOGASTRODUODENOSCOPY (EGD) WITH PROPOFOL;  Surgeon: Lesly Rubenstein, MD;  Location: [facility] ENDOSCOPY;  Service: Endoscopy;  Laterality: N/A;   EYE SURGERY  2011   left cataract   KNEE ARTHROSCOPY Left 05/03/2015   Procedure: left knee arthroscopy, partial medial menisectomy, chondroplasty;  Surgeon: Dereck Leep, MD;  Location: [facility] ORS;  Service: Orthopedics;  Laterality: Left;   TONSILLECTOMY      Family Psychiatric History: Reviewed family psychiatric history from progress note on 01/04/2020.  Family History:  Family History  Problem Relation Age of Onset  Cancer Mother 74       ovarian    Pernicious anemia Mother    COPD Father        nonsmoker   Cancer Cousin        ovarian ca    Social History: Reviewed social history from a progress note on 01/04/2020. Social History   Socioeconomic History   Marital status: Widowed    Spouse name: Not on file   Number of children: 5   Years of education: Not on file   Highest education level: Not on file  Occupational History   Occupation: retired  Tobacco Use   Smoking status: Never    Smokeless tobacco: Never  Vaping Use   Vaping Use: Never used  Substance and Sexual Activity   Alcohol use: No   Drug use: No   Sexual activity: Not Currently  Other Topics Concern   Not on file  Social History Narrative   Regular exercise: not really   Caffeine use: some   Daughter lives with her.    Social Determinants of Health   Financial Resource Strain: Low Risk  (06/29/2021)   Overall Financial Resource Strain (CARDIA)    Difficulty of Paying Living Expenses: Not hard at all  Food Insecurity: No Food Insecurity (06/29/2021)   Hunger Vital Sign    Worried About Running Out of Food in the Last Year: Never true    Ran Out of Food in the Last Year: Never true  Transportation Needs: No Transportation Needs (06/29/2021)   PRAPARE - Hydrologist (Medical): No    Lack of Transportation (Non-Medical): No  Physical Activity: Unknown (06/28/2020)   Exercise Vital Sign    Days of Exercise per Week: 0 days    Minutes of Exercise per Session: Not on file  Stress: No Stress Concern Present (06/29/2021)   [place]    Feeling of Stress : Not at all  Social Connections: Moderately Isolated (06/29/2021)   Social Connection and Isolation Panel [NHANES]    Frequency of Communication with Friends and Family: Once a week    Frequency of Social Gatherings with Friends and Family: More than three times a week    Attends Religious Services: More than 4 times per year    Active Member of [employer] or Organizations: No    Attends Archivist Meetings: Never    Marital Status: Widowed    Allergies:  Allergies  Allergen Reactions   Codeine Other (See Comments)    Crying spells   Darvon [Propoxyphene Hcl] Other (See Comments)    Crying spells   Propoxyphene Other (See Comments)    Crying, altered mental status    Metabolic Disorder Labs: Lab Results  Component Value Date   HGBA1C 6.0  08/21/2017   Lab Results  Component Value Date   PROLACTIN 11.9 05/12/2020   Lab Results  Component Value Date   CHOL 166 02/19/2021   TRIG 139.0 02/19/2021   HDL 44.40 02/19/2021   CHOLHDL 4 02/19/2021   VLDL 27.8 02/19/2021   LDLCALC 93 02/19/2021   LDLCALC 120 (H) 05/12/2020   Lab Results  Component Value Date   TSH 3.10 04/17/2022   TSH 3.19 09/10/2021    Therapeutic Level Labs: No results found for: "LITHIUM" No results found for: "VALPROATE" No results found for: "CBMZ"  Current Medications: Current Outpatient Medications  Medication Sig Dispense Refill   busPIRone (BUSPAR) 10 MG tablet Take  2 tablets (20 mg total) by mouth at bedtime. 180 tablet 3   cetirizine (ZYRTEC) 5 MG tablet TAKE 1 TABLET(5 MG) BY MOUTH DAILY 90 tablet 1   Cholecalciferol (VITAMIN D-3 PO) Take 1 capsule by mouth daily. 500iu     famotidine (PEPCID) 40 MG tablet Take 1 tablet (40 mg total) by mouth daily. 90 tablet 1   memantine (NAMENDA) 5 MG tablet Take 5 mg by mouth 2 (two) times daily.     QUEtiapine (SEROQUEL) 50 MG tablet Take 1 tablet (50 mg total) by mouth at bedtime. 30 tablet 1   rosuvastatin (CRESTOR) 5 MG tablet Take 1 tablet (5 mg total) by mouth every evening. 90 tablet 3   vitamin B-12 (CYANOCOBALAMIN) 1000 MCG tablet Take 1,000 mcg by mouth daily.     escitalopram (LEXAPRO) 10 MG tablet Take 1 tablet (10 mg total) by mouth daily. 90 tablet 3   No current facility-administered medications for this visit.     Musculoskeletal: Strength & Muscle Tone: within normal limits Gait & Station:  walks with cane Patient leans: N/A  Psychiatric Specialty Exam: Review of Systems  Unable to perform ROS: Dementia    Blood pressure (!) 117/55, pulse 77, temperature (!) 97.3 F (36.3 C), temperature source Temporal, height '5\' 3"'$  (1.6 m), weight 161 lb (73 kg).Body mass index is 28.52 kg/m.  General Appearance: Casual  Eye Contact:  Fair  Speech:  Normal Rate  Volume:  Decreased   Mood:   unable to verbalize  Affect:   anxious  Thought Process:  Irrelevant and Descriptions of Associations: Loose  Orientation:  Other:  unable to give the date  Thought Content: Delusions and Paranoid Ideation   Suicidal Thoughts:   did not express  Homicidal Thoughts:   did not express  Memory:  Immediate;   Fair Recent;   limited Remote;   limited  Judgement:  Poor  Insight:  Shallow  Psychomotor Activity:  Restlessness  Concentration:  Concentration: Poor and Attention Span: Poor  Recall:  Poor  Fund of Knowledge: Poor  Language: Fair  Akathisia:  No  Handed:  Right  AIMS (if indicated): not done, patient declined  Assets:  Housing Microbiologist  ADL's:  Intact with support  Cognition: Impaired,  Moderate  Sleep:   improving   Screenings: [place] Office Visit from 01/03/2022 in [place] Visit from 06/16/2017 in [place] Visit from 02/07/2016 in [place] Total Score 0 0 0      [place] Office Visit from 04/17/2022 in [place] Office Visit from 04/04/2022 in [place] Video Visit from 03/25/2022 in [place]  Total GAD-7 Score 0 0 [place] Visit from 10/09/2015 in [place]  Total Score (max 30 points ) 27      PHQ2-9    [place] Visit from 04/17/2022 in [place] from 04/04/2022 in [facility] Video Visit from 03/25/2022 in [place] Visit from 01/03/2022 in [place] from 06/29/2021 in [place]  PHQ-2 Total Score 5 0 0 0 0  PHQ-9 Total Score 5 0 -- -- --      Flowsheet Row ED to Hosp-Admission (Discharged) from  03/25/2022  in [place] Office Visit from 01/03/2022 in [place] Video Visit from 03/23/2021 in [place] No Risk No Risk Error: Question 1 not populated        Assessment and Plan: JOHNNAE IMPASTATO is a 88 year old Caucasian female, lives in [place], has a history of depression, dementia, anxiety disorder, recent hospital admission-03/25/2022 - 03/28/2022 for altered mental status, urinary tract infection with sepsis and dysphagia, presented with continued psychosis, worsening memory problems likely due to progressing dementia, will benefit from following plan.  Plan MDD in remission Lexapro 10 mg p.o. daily  Other specified anxiety disorder-unstable Continue BuSpar 10 mg p.o. daily Increase Seroquel to 50 mg p.o. nightly.   Altered mental status-likely combination of dementia along with recent UTI probably causing delirium-some improvement Increase Seroquel as noted above. Provided education about side effects including black box warning in elderly and patients with dementia.  At risk for prolonged QT syndrome-we will order EKG-provided [phone] phone number to schedule the appointment.  Lewy body dementia-unstable Patient to follow-up with neurology.  Had recent appointment with Dr. Manuella Ghazi.  Collateral information obtained from daughter as noted above.  Follow-up in clinic in 2 to 3 weeks or sooner if needed.  This note was generated in part or whole with voice recognition software. Voice recognition is usually quite accurate but there are transcription errors that can and very often do occur. I apologize for any typographical errors that were not detected and corrected.      Ursula Alert, MD 05/21/2022, 1:00 PM

## 2022-05-21 NOTE — Telephone Encounter (Signed)
went online to [email or web address] and submitted the prior auth for the quetiapine -  pending

## 2022-05-22 NOTE — Telephone Encounter (Signed)
prior auth for quetiapine was approved from 05-21-22 to 05-22-23

## 2022-06-05 DIAGNOSIS — N1832 Chronic kidney disease, stage 3b: Secondary | ICD-10-CM | POA: Diagnosis not present

## 2022-06-10 DIAGNOSIS — N1832 Chronic kidney disease, stage 3b: Secondary | ICD-10-CM | POA: Diagnosis not present

## 2022-06-27 ENCOUNTER — Telehealth (INDEPENDENT_AMBULATORY_CARE_PROVIDER_SITE_OTHER): Payer: Medicare Other | Admitting: Psychiatry

## 2022-06-27 ENCOUNTER — Encounter: Payer: Self-pay | Admitting: Psychiatry

## 2022-06-27 DIAGNOSIS — G3183 Dementia with Lewy bodies: Secondary | ICD-10-CM | POA: Diagnosis not present

## 2022-06-27 DIAGNOSIS — F02818 Dementia in other diseases classified elsewhere, unspecified severity, with other behavioral disturbance: Secondary | ICD-10-CM

## 2022-06-27 DIAGNOSIS — F418 Other specified anxiety disorders: Secondary | ICD-10-CM

## 2022-06-27 DIAGNOSIS — F3342 Major depressive disorder, recurrent, in full remission: Secondary | ICD-10-CM | POA: Diagnosis not present

## 2022-06-27 MED ORDER — QUETIAPINE FUMARATE 50 MG PO TABS: 50.0000 mg | ORAL_TABLET | Freq: Every day | ORAL | 2 refills | Status: AC

## 2022-06-27 NOTE — Progress Notes (Signed)
Virtual Visit via Video Note  I connected with [name] on 06/27/22 at  2:20 PM EST by a video enabled telemedicine application and verified that I am speaking with the correct person using two identifiers.  Location Provider Location : [facility] Patient Location : Home  Participants: Patient , Provider   I discussed the limitations of evaluation and management by telemedicine and the availability of in person appointments. The patient expressed understanding and agreed to proceed.   I discussed the assessment and treatment plan with the patient. The patient was provided an opportunity to ask questions and all were answered. The patient agreed with the plan and demonstrated an understanding of the instructions.   The patient was advised to call back or seek an in-person evaluation if the symptoms worsen or if the condition fails to improve as anticipated.   [place] MD OP Progress Note  06/27/2022 5:41 PM [name]  MRN:  [id number]  Chief Complaint:  Chief Complaint  Patient presents with   Follow-up   Medication Refill   Hallucinations   Delusional   Paranoid   HPI: [name] is a 88 year old Caucasian female with history of MDD, Lewy body dementia, gastroesophageal reflux disease, obstructive sleep apnea on CPAP, osteoporosis, was evaluated by telemedicine today.  Patient appeared to be alert, oriented to situation and self, could not give the month, year.  Patient being a limited historian majority of information obtained from daughter who was with her-[name].  As per daughter patient continues to have episodes of confusion, memory problems especially at the end of the day.  She continues to be paranoid, delusional, having hallucinations , of people asking her to do things " telling her things about medications , not to take them , or they would take away her daughter from her ".  She also does not do well with any kind of change, looks for her daughter even when a her aide  comes into the house couple of times a week.  Patient also does have trouble taking her medications some days.  Patient does sleep okay.  Continues to follow-up with neurology.  Denies any suicidality, homicidality or perceptual disturbances.  Per daughter she has not observed any side effects to current medication regimen.  Denies any other concerns today.  Visit Diagnosis:    ICD-10-CM   1. MDD (major depressive disorder), recurrent, in full remission ([place])  F33.42 QUEtiapine (SEROQUEL) 50 MG tablet    2. Lewy body dementia with behavioral disturbance (HCC)  G31.83 QUEtiapine (SEROQUEL) 50 MG tablet   F02.818     3. Other specified anxiety disorders  F41.8    Limited symptom attacks      Past Psychiatric History: Reviewed past psychiatric history from progress note on 01/04/2020.  Past Medical History:  Past Medical History:  Diagnosis Date   Anxiety    Depression    GERD (gastroesophageal reflux disease)    Hypercalcemia    Pneumonia    Sepsis ([place])    Sleep apnea    SOB (shortness of breath) on exertion     Past Surgical History:  Procedure Laterality Date   ENDOMETRIAL ABLATION     ESOPHAGOGASTRODUODENOSCOPY (EGD) WITH PROPOFOL N/A 03/27/2022   Procedure: ESOPHAGOGASTRODUODENOSCOPY (EGD) WITH PROPOFOL;  Surgeon: Lesly Rubenstein, MD;  Location: [facility] ENDOSCOPY;  Service: Endoscopy;  Laterality: N/A;   EYE SURGERY  2011   left cataract   KNEE ARTHROSCOPY Left 05/03/2015   Procedure: left knee arthroscopy, partial medial menisectomy,  chondroplasty;  Surgeon: Dereck Leep, MD;  Location: [facility] ORS;  Service: Orthopedics;  Laterality: Left;   TONSILLECTOMY      Family Psychiatric History: Reviewed family psych History from progress note on 01/04/2020.  Family History:  Family History  Problem Relation Age of Onset   Cancer Mother 53       ovarian    Pernicious anemia Mother    COPD Father        nonsmoker   Cancer Cousin        ovarian ca    Social  History: Reviewed social history from progress note on 01/04/2020. Social History   Socioeconomic History   Marital status: Widowed    Spouse name: Not on file   Number of children: 5   Years of education: Not on file   Highest education level: Not on file  Occupational History   Occupation: retired  Tobacco Use   Smoking status: Never   Smokeless tobacco: Never  Vaping Use   Vaping Use: Never used  Substance and Sexual Activity   Alcohol use: No   Drug use: No   Sexual activity: Not Currently  Other Topics Concern   Not on file  Social History Narrative   Regular exercise: not really   Caffeine use: some   Daughter lives with her.    Social Determinants of Health   Financial Resource Strain: Low Risk  (06/29/2021)   Overall Financial Resource Strain (CARDIA)    Difficulty of Paying Living Expenses: Not hard at all  Food Insecurity: No Food Insecurity (06/29/2021)   Hunger Vital Sign    Worried About Running Out of Food in the Last Year: Never true    Ran Out of Food in the Last Year: Never true  Transportation Needs: No Transportation Needs (06/29/2021)   PRAPARE - Hydrologist (Medical): No    Lack of Transportation (Non-Medical): No  Physical Activity: Unknown (06/28/2020)   Exercise Vital Sign    Days of Exercise per Week: 0 days    Minutes of Exercise per Session: Not on file  Stress: No Stress Concern Present (06/29/2021)   [place]    Feeling of Stress : Not at all  Social Connections: Moderately Isolated (06/29/2021)   Social Connection and Isolation Panel [NHANES]    Frequency of Communication with Friends and Family: Once a week    Frequency of Social Gatherings with Friends and Family: More than three times a week    Attends Religious Services: More than 4 times per year    Active Member of [employer] or Organizations: No    Attends Archivist Meetings:  Never    Marital Status: Widowed    Allergies:  Allergies  Allergen Reactions   Codeine Other (See Comments)    Crying spells   Darvon [Propoxyphene Hcl] Other (See Comments)    Crying spells   Propoxyphene Other (See Comments)    Crying, altered mental status    Metabolic Disorder Labs: Lab Results  Component Value Date   HGBA1C 6.0 08/21/2017   Lab Results  Component Value Date   PROLACTIN 11.9 05/12/2020   Lab Results  Component Value Date   CHOL 166 02/19/2021   TRIG 139.0 02/19/2021   HDL 44.40 02/19/2021   CHOLHDL 4 02/19/2021   VLDL 27.8 02/19/2021   LDLCALC 93 02/19/2021   LDLCALC 120 (H) 05/12/2020   Lab Results  Component Value Date  TSH 3.10 04/17/2022   TSH 3.19 09/10/2021    Therapeutic Level Labs: No results found for: "LITHIUM" No results found for: "VALPROATE" No results found for: "CBMZ"  Current Medications: Current Outpatient Medications  Medication Sig Dispense Refill   busPIRone (BUSPAR) 10 MG tablet Take 2 tablets (20 mg total) by mouth at bedtime. 180 tablet 3   cetirizine (ZYRTEC) 5 MG tablet TAKE 1 TABLET(5 MG) BY MOUTH DAILY 90 tablet 1   Cholecalciferol (VITAMIN D-3 PO) Take 1 capsule by mouth daily. 500iu     escitalopram (LEXAPRO) 10 MG tablet Take 1 tablet (10 mg total) by mouth daily. 90 tablet 3   famotidine (PEPCID) 40 MG tablet Take 1 tablet (40 mg total) by mouth daily. 90 tablet 1   QUEtiapine (SEROQUEL) 50 MG tablet Take 1-1.5 tablets (50-75 mg total) by mouth at bedtime. 45 tablet 2   rosuvastatin (CRESTOR) 5 MG tablet Take 1 tablet (5 mg total) by mouth every evening. 90 tablet 3   vitamin B-12 (CYANOCOBALAMIN) 1000 MCG tablet Take 1,000 mcg by mouth daily.     No current facility-administered medications for this visit.     Musculoskeletal: Strength & Muscle Tone:  UTA Gait & Station:  Seated Patient leans: N/A  Psychiatric Specialty Exam: Review of Systems  Unable to perform ROS: Dementia    There were no  vitals taken for this visit.There is no height or weight on file to calculate BMI.  General Appearance: Casual  Eye Contact:  Fair  Speech:  Normal Rate  Volume:  Normal  Mood:   unable to verbalize  Affect:  Flat  Thought Process:  Linear and Descriptions of Associations: Intact  Orientation:  Other:  self, situation  Thought Content: Delusions , VH and AH per daughter  Suicidal Thoughts:  No  Homicidal Thoughts:  No  Memory:  Immediate;   limited Recent;   limited Remote;   limited  Judgement:  Poor  Insight:  Shallow  Psychomotor Activity:  Normal  Concentration:  Concentration: Poor and Attention Span: Poor  Recall:  Poor  Fund of Knowledge: Poor  Language: Fair  Akathisia:  No  Handed:  Right  AIMS (if indicated): not done  Assets:  Housing Social Support  ADL's:  Intact  Cognition: Impaired,  Moderate  Sleep:  Fair   Screenings: [place] Visit from 01/03/2022 in [place] Office Visit from 06/16/2017 in [place] Visit from 02/07/2016 in [place] Total Score 0 0 0      [place] Office Visit from 04/17/2022 in [place] from 04/04/2022 in [place] Video Visit from 03/25/2022 in [place]  Total GAD-7 Score 0 0 [place] Visit from 10/09/2015 in [place]  Total Score (max 30 points ) 27      PHQ2-9    [place] Visit from 04/17/2022 in [place] from 04/04/2022 in [facility] Video Visit from 03/25/2022 in [place] from 01/03/2022 in [place] from 06/29/2021 in [place]  PHQ-2 Total Score 5 0 0 0 0  PHQ-9 Total Score  5 0 -- -- --      Flowsheet Row Video Visit from 06/27/2022 in  [place] [facility] to Hosp-Admission (Discharged) from 03/25/2022 in [place] Office Visit from 01/03/2022 in [place] No Risk No Risk No Risk        Assessment and Plan: [name] is a 88 year old Caucasian female, lives in [place], has a history of depression, dementia, anxiety disorder, recent hospital admission-03/25/2022 - 03/28/2022 for altered mental status, UTI with sepsis, dysphagia, continues to have worsening memory problems, psychosis as noted above likely due to Lewy body dementia progressing, will benefit from the following plan.  Plan MDD in remission Lexapro 10 mg p.o. daily  Lewy body dementia with behavioral disturbances-unstable Increase Seroquel to 50-75 mg p.o. nightly.   Other specified anxiety disorder-improving BuSpar 10 mg p.o. daily  Collateral information obtained from daughter as noted above.  Patient to continue to follow-up with neurology.  Follow-up in clinic in 3 months or sooner if needed.   Collaboration of Care: Collaboration of Care: Other encouraged to follow up with neurology.  Patient/Guardian was advised Release of Information must be obtained prior to any record release in order to collaborate their care with an outside provider. Patient/Guardian was advised if they have not already done so to contact the registration department to sign all necessary forms in order for [place] to release information regarding their care.   Consent: Patient/Guardian gives verbal consent for treatment and assignment of benefits for services provided during this visit. Patient/Guardian expressed understanding and agreed to proceed.   This note was generated in part or whole with voice recognition software. Voice recognition is usually quite accurate but there are transcription  errors that can and very often do occur. I apologize for any typographical errors that were not detected and corrected.      Ursula Alert, MD 06/27/2022, 5:41 PM

## 2022-07-01 ENCOUNTER — Ambulatory Visit (INDEPENDENT_AMBULATORY_CARE_PROVIDER_SITE_OTHER): Payer: Medicare Other

## 2022-07-01 VITALS — Ht 63.0 in | Wt 161.0 lb

## 2022-07-01 DIAGNOSIS — Z0000 Encounter for general adult medical examination without abnormal findings: Secondary | ICD-10-CM

## 2022-07-01 NOTE — Patient Instructions (Addendum)
[name] , Thank you for taking time to come for your Medicare Wellness Visit. I appreciate your ongoing commitment to your health goals. Please review the following plan we discussed and let me know if I can assist you in the future.   These are the goals we discussed:  Goals      Healthy Lifestyle     Stay active as possible. Eat healthy. Stay hydrated.        This is a list of the screening recommended for you and due dates:  Health Maintenance  Topic Date Due   COVID-19 Vaccine (4 - Pfizer risk series) 07/17/2022*   Zoster (Shingles) Vaccine (1 of 2) 10/01/2022*   Medicare Annual Wellness Visit  07/02/2023   Pneumonia Vaccine  Completed   Flu Shot  Completed   DEXA scan (bone density measurement)  Completed   HPV Vaccine  Aged Out  *Topic was postponed. The date shown is not the original due date.    Advanced directives: on file  Conditions/risks identified: none new  Next appointment: Follow up in one year for your annual wellness visit    Preventive Care 65 Years and Older, Female Preventive care refers to lifestyle choices and visits with your health care provider that can promote health and wellness. What does preventive care include? A yearly physical exam. This is also called an annual well check. Dental exams once or twice a year. Routine eye exams. Ask your health care provider how often you should have your eyes checked. Personal lifestyle choices, including: Daily care of your teeth and gums. Regular physical activity. Eating a healthy diet. Avoiding tobacco and drug use. Limiting alcohol use. Practicing safe sex. Taking low-dose aspirin every day. Taking vitamin and mineral supplements as recommended by your health care provider. What happens during an annual well check? The services and screenings done by your health care provider during your annual well check will depend on your age, overall health, lifestyle risk factors, and family history of  disease. Counseling  Your health care provider may ask you questions about your: Alcohol use. Tobacco use. Drug use. Emotional well-being. Home and relationship well-being. Sexual activity. Eating habits. History of falls. Memory and ability to understand (cognition). Work and work Statistician. Reproductive health. Screening  You may have the following tests or measurements: Height, weight, and BMI. Blood pressure. Lipid and cholesterol levels. These may be checked every 5 years, or more frequently if you are over 58 years old. Skin check. Lung cancer screening. You may have this screening every year starting at age 50 if you have a 30-pack-year history of smoking and currently smoke or have quit within the past 15 years. Fecal occult blood test (FOBT) of the stool. You may have this test every year starting at age 58. Flexible sigmoidoscopy or colonoscopy. You may have a sigmoidoscopy every 5 years or a colonoscopy every 10 years starting at age 77. Hepatitis C blood test. Hepatitis B blood test. Sexually transmitted disease (STD) testing. Diabetes screening. This is done by checking your blood sugar (glucose) after you have not eaten for a while (fasting). You may have this done every 1-3 years. Bone density scan. This is done to screen for osteoporosis. You may have this done starting at age 34. Mammogram. This may be done every 1-2 years. Talk to your health care provider about how often you should have regular mammograms. Talk with your health care provider about your test results, treatment options, and if necessary, the need  for more tests. Vaccines  Your health care provider may recommend certain vaccines, such as: Influenza vaccine. This is recommended every year. Tetanus, diphtheria, and acellular pertussis (Tdap, Td) vaccine. You may need a Td booster every 10 years. Zoster vaccine. You may need this after age 85. Pneumococcal 13-valent conjugate (PCV13) vaccine. One  dose is recommended after age 66. Pneumococcal polysaccharide (PPSV23) vaccine. One dose is recommended after age 18. Talk to your health care provider about which screenings and vaccines you need and how often you need them. This information is not intended to replace advice given to you by your health care provider. Make sure you discuss any questions you have with your health care provider. Document Released: 08/25/2015 Document Revised: 04/17/2016 Document Reviewed: 05/30/2015 Elsevier Interactive Patient Education  2017 [place] Prevention in the Home Falls can cause injuries. They can happen to people of all ages. There are many things you can do to make your home safe and to help prevent falls. What can I do on the outside of my home? Regularly fix the edges of walkways and driveways and fix any cracks. Remove anything that might make you trip as you walk through a door, such as a raised step or threshold. Trim any bushes or trees on the path to your home. Use bright outdoor lighting. Clear any walking paths of anything that might make someone trip, such as rocks or tools. Regularly check to see if handrails are loose or broken. Make sure that both sides of any steps have handrails. Any raised decks and porches should have guardrails on the edges. Have any leaves, snow, or ice cleared regularly. Use sand or salt on walking paths during winter. Clean up any spills in your garage right away. This includes oil or grease spills. What can I do in the bathroom? Use night lights. Install grab bars by the toilet and in the tub and shower. Do not use towel bars as grab bars. Use non-skid mats or decals in the tub or shower. If you need to sit down in the shower, use a plastic, non-slip stool. Keep the floor dry. Clean up any water that spills on the floor as soon as it happens. Remove soap buildup in the tub or shower regularly. Attach bath mats securely with double-sided  non-slip rug tape. Do not have throw rugs and other things on the floor that can make you trip. What can I do in the bedroom? Use night lights. Make sure that you have a light by your bed that is easy to reach. Do not use any sheets or blankets that are too big for your bed. They should not hang down onto the floor. Have a firm chair that has side arms. You can use this for support while you get dressed. Do not have throw rugs and other things on the floor that can make you trip. What can I do in the kitchen? Clean up any spills right away. Avoid walking on wet floors. Keep items that you use a lot in easy-to-reach places. If you need to reach something above you, use a strong step stool that has a grab bar. Keep electrical cords out of the way. Do not use floor polish or wax that makes floors slippery. If you must use wax, use non-skid floor wax. Do not have throw rugs and other things on the floor that can make you trip. What can I do with my stairs? Do not leave any items on the stairs.  Make sure that there are handrails on both sides of the stairs and use them. Fix handrails that are broken or loose. Make sure that handrails are as long as the stairways. Check any carpeting to make sure that it is firmly attached to the stairs. Fix any carpet that is loose or worn. Avoid having throw rugs at the top or bottom of the stairs. If you do have throw rugs, attach them to the floor with carpet tape. Make sure that you have a light switch at the top of the stairs and the bottom of the stairs. If you do not have them, ask someone to add them for you. What else can I do to help prevent falls? Wear shoes that: Do not have high heels. Have rubber bottoms. Are comfortable and fit you well. Are closed at the toe. Do not wear sandals. If you use a stepladder: Make sure that it is fully opened. Do not climb a closed stepladder. Make sure that both sides of the stepladder are locked into place. Ask  someone to hold it for you, if possible. Clearly mark and make sure that you can see: Any grab bars or handrails. First and last steps. Where the edge of each step is. Use tools that help you move around (mobility aids) if they are needed. These include: Canes. Walkers. Scooters. Crutches. Turn on the lights when you go into a dark area. Replace any light bulbs as soon as they burn out. Set up your furniture so you have a clear path. Avoid moving your furniture around. If any of your floors are uneven, fix them. If there are any pets around you, be aware of where they are. Review your medicines with your doctor. Some medicines can make you feel dizzy. This can increase your chance of falling. Ask your doctor what other things that you can do to help prevent falls. This information is not intended to replace advice given to you by your health care provider. Make sure you discuss any questions you have with your health care provider. Document Released: 05/25/2009 Document Revised: 01/04/2016 Document Reviewed: 09/02/2014 Elsevier Interactive Patient Education  2017 [employer].

## 2022-07-01 NOTE — Progress Notes (Signed)
Subjective:   [name] is a 88 y.o. female who presents for Medicare Annual (Subsequent) preventive examination.  Review of Systems    No ROS.  Medicare Wellness Virtual Visit.  Visual/audio telehealth visit, UTA vital signs.   See social history for additional risk factors.   Cardiac Risk Factors include: advanced age (>67mn, >>41women)     Objective:    Today's Vitals   07/01/22 1340  Weight: 161 lb (73 kg)  Height: '5\' 3"'$  (1.6 m)   Body mass index is 28.52 kg/m.     07/01/2022    2:27 PM 03/27/2022   12:29 PM 03/26/2022   11:15 AM 03/25/2022    7:00 PM 06/29/2021    3:15 PM 06/13/2021    3:55 PM 06/28/2020   11:39 AM  Advanced Directives  Does Patient Have a Medical Advance Directive? Yes Yes Yes No Yes  Yes  Type of Advance Directive Living will;Healthcare Power of [place]  [place]  Does patient want to make changes to medical advance directive? No - Patient declined  No - Patient declined  No - Patient declined  No - Patient declined  Copy of [place] Chart? Yes - validated most recent copy scanned in chart (See row information)  No - copy requested  Yes - validated most recent copy scanned in chart (See row information)  Yes - validated most recent copy scanned in chart (See row information)  Would patient like information on creating a medical advance directive?   No - Patient declined No - Patient declined        Information is confidential and restricted. Go to Review Flowsheets to unlock data.    Current Medications (verified) Outpatient Encounter Medications as of 07/01/2022  Medication Sig   busPIRone (BUSPAR) 10 MG tablet Take 2 tablets (20 mg total) by mouth at bedtime.   cetirizine (ZYRTEC) 5 MG tablet TAKE 1 TABLET(5 MG) BY MOUTH DAILY   Cholecalciferol (VITAMIN D-3 PO) Take 1 capsule by mouth  daily. 500iu   escitalopram (LEXAPRO) 10 MG tablet Take 1 tablet (10 mg total) by mouth daily.   famotidine (PEPCID) 40 MG tablet Take 1 tablet (40 mg total) by mouth daily.   QUEtiapine (SEROQUEL) 50 MG tablet Take 1-1.5 tablets (50-75 mg total) by mouth at bedtime.   rosuvastatin (CRESTOR) 5 MG tablet Take 1 tablet (5 mg total) by mouth every evening.   vitamin B-12 (CYANOCOBALAMIN) 1000 MCG tablet Take 1,000 mcg by mouth daily.   No facility-administered encounter medications on file as of 07/01/2022.    Allergies (verified) Codeine, Darvon [propoxyphene hcl], and Propoxyphene   History: Past Medical History:  Diagnosis Date   Anxiety    Depression    GERD (gastroesophageal reflux disease)    Hypercalcemia    Pneumonia    Sepsis ([place]    Sleep apnea    SOB (shortness of breath) on exertion    Past Surgical History:  Procedure Laterality Date   ENDOMETRIAL ABLATION     ESOPHAGOGASTRODUODENOSCOPY (EGD) WITH PROPOFOL N/A 03/27/2022   Procedure: ESOPHAGOGASTRODUODENOSCOPY (EGD) WITH PROPOFOL;  Surgeon: LLesly Rubenstein MD;  Location: [facility] ENDOSCOPY;  Service: Endoscopy;  Laterality: N/A;   EYE SURGERY  2011   left cataract   KNEE ARTHROSCOPY Left 05/03/2015   Procedure: left knee arthroscopy, partial medial menisectomy, chondroplasty;  Surgeon: JDereck Leep MD;  Location: [facility] ORS;  Service: Orthopedics;  Laterality: Left;   TONSILLECTOMY     Family History  Problem Relation Age of Onset   Cancer Mother 82       ovarian    Pernicious anemia Mother    COPD Father        nonsmoker   Cancer Cousin        ovarian ca   Social History   Socioeconomic History   Marital status: Widowed    Spouse name: Not on file   Number of children: 5   Years of education: Not on file   Highest education level: Not on file  Occupational History   Occupation: retired  Tobacco Use   Smoking status: Never   Smokeless tobacco: Never  Vaping Use   Vaping Use: Never used   Substance and Sexual Activity   Alcohol use: No   Drug use: No   Sexual activity: Not Currently  Other Topics Concern   Not on file  Social History Narrative   Regular exercise: not really   Caffeine use: some   Daughter lives with her.    Social Determinants of Health   Financial Resource Strain: Low Risk  (07/01/2022)   Overall Financial Resource Strain (CARDIA)    Difficulty of Paying Living Expenses: Not hard at all  Food Insecurity: No Food Insecurity (07/01/2022)   Hunger Vital Sign    Worried About Running Out of Food in the Last Year: Never true    Ran Out of Food in the Last Year: Never true  Transportation Needs: No Transportation Needs (07/01/2022)   PRAPARE - Hydrologist (Medical): No    Lack of Transportation (Non-Medical): No  Physical Activity: Unknown (06/28/2020)   Exercise Vital Sign    Days of Exercise per Week: 0 days    Minutes of Exercise per Session: Not on file  Stress: No Stress Concern Present (07/01/2022)   [place]    Feeling of Stress : Not at all  Social Connections: Moderately Isolated (07/01/2022)   Social Connection and Isolation Panel [NHANES]    Frequency of Communication with Friends and Family: Once a week    Frequency of Social Gatherings with Friends and Family: More than three times a week    Attends Religious Services: More than 4 times per year    Active Member of [employer] or Organizations: No    Attends Archivist Meetings: Never    Marital Status: Widowed    Tobacco Counseling Counseling given: Not Answered   Clinical Intake:  Pre-visit preparation completed: No        Diabetes: No  How often do you need to have someone help you when you read instructions, pamphlets, or other written materials from your doctor or pharmacy?: 5 - Always    Interpreter Needed?: No      Activities of Daily Living     07/01/2022    1:45 PM 03/26/2022   11:15 AM  In your present state of health, do you have any difficulty performing the following activities:  Hearing? 1 1  Comment Hearing aids   Vision? 0 0  Difficulty concentrating or making decisions? 1 1  Comment Dx Dementia   Walking or climbing stairs? 1 1  Comment Walker in use when ambulating   Dressing or bathing? 1 1  Comment Daughter assist   Doing errands, shopping? 1 1  Comment Daughter assist  Preparing Food and eating ? Y   Comment Daughter assist with meal prep. Self feeds.   Using the Toilet? N   In the past six months, have you accidently leaked urine? N   Do you have problems with loss of bowel control? N   Managing your Medications? Y   Comment Daughter assist   Managing your Finances? Y   Comment Daughter assist   Housekeeping or managing your Housekeeping? Y   Comment Daughter assist     Patient Care Team: Burnard Hawthorne, FNP as PCP - General (Family Medicine)  Indicate any recent Medical Services you may have received from other than [facility] providers in the past year (date may be approximate).     Assessment:   This is a routine wellness examination for [name].  I connected with  [name] on 07/01/22 by a audio enabled telemedicine application and verified that I am speaking with the correct person using two identifiers.  Patient Location: Home  Provider Location: Office/Clinic  I discussed the limitations of evaluation and management by telemedicine. The patient expressed understanding and agreed to proceed.   Hearing/Vision screen Hearing Screening - Comments::  Hearing aids Vision Screening - Comments:: Followed by Dr. Ellin Mayhew  Wears reading glasses   Dietary issues and exercise activities discussed: Current Exercise Habits: Home exercise routine, Intensity: Mild  Healthy diet   Goals Addressed             This Visit's Progress    Healthy Lifestyle       Stay active as possible. Eat  healthy. Stay hydrated.       Depression Screen    07/01/2022    1:42 PM 06/27/2022    2:47 PM 04/24/2022   11:04 AM 04/17/2022    1:48 PM 04/04/2022    2:57 PM 03/25/2022    4:00 PM 01/03/2022    2:57 PM  PHQ 2/9 Scores  PHQ - 2 Score 0   5 0 0   PHQ- 9 Score    5 0    Exception Documentation            Information is confidential and restricted. Go to Review Flowsheets to unlock data.    Fall Risk    07/01/2022    1:43 PM 04/17/2022    1:48 PM 04/04/2022    2:56 PM 03/25/2022    4:00 PM 06/29/2021    3:16 PM  [place] in the past year? 0 0 0 0 0  Number falls in past yr: 0 0 0 0 0  Injury with Fall? 0 0 0 0   Risk for fall due to :  No Fall Risks History of fall(s) No Fall Risks   Follow up Falls evaluation completed Falls evaluation completed Falls evaluation completed Falls evaluation completed Falls evaluation completed    [place]: Home free of loose throw rugs in walkways, pet beds, electrical cords, etc? Yes  Adequate lighting in your home to reduce risk of falls? Yes   ASSISTIVE DEVICES UTILIZED TO PREVENT FALLS: Life alert? No  Use of a cane, walker or w/c? Yes  Grab bars in the bathroom? Yes  Shower chair or bench in shower? Yes  Elevated toilet seat or a handicapped toilet? No   TIMED UP AND GO: Was the test performed? No .   Cognitive Function: Patient is alert.  Followed by Neurology.  Dx Dementia.  Unable to complete MMSE/6CIT.  07/01/2022    2:30 PM 06/28/2020   12:02 PM 10/09/2015    2:02 PM  MMSE - Mini Mental State Exam  Not completed: Unable to complete Unable to complete   Orientation to time     Orientation to Place     Registration     Attention/ Calculation     Recall     Language- name 2 objects     Language- repeat     Language- follow 3 step command     Language- read & follow direction     Write a sentence     Copy design     Total score        Information is confidential and  restricted. Go to Review Flowsheets to unlock data.        06/26/2018   11:33 AM  6CIT Screen  What Year? 0 points  What month? 0 points  What time? 0 points  Count back from 20 0 points  Months in reverse 0 points  Repeat phrase 0 points  Total Score 0 points    Immunizations Immunization History  Administered Date(s) Administered   Fluad Quad(high Dose 65+) 05/12/2020, 05/26/2022   Influenza Split 06/24/2014, 06/12/2015   Influenza, High Dose Seasonal PF 05/30/2016, 05/09/2017, 06/12/2018, 05/26/2021   Influenza,inj,Quad PF,6+ Mos 05/07/2013   Influenza-Unspecified 05/09/2017   PFIZER(Purple Top)SARS-COV-2 Vaccination 09/09/2019, 09/26/2019, 07/24/2020   Pneumococcal Conjugate-13 06/26/2015   Pneumococcal Polysaccharide-23 12/28/2010, 11/02/2012   Shingrix Completed?: No.    Education has been provided regarding the importance of this vaccine. Patient has been advised to call insurance company to determine out of pocket expense if they have not yet received this vaccine. Advised may also receive vaccine at local pharmacy or Health Dept. Verbalized acceptance and understanding.  Screening Tests Health Maintenance  Topic Date Due   COVID-19 Vaccine (4 - Pfizer risk series) 07/17/2022 (Originally 09/18/2020)   Zoster Vaccines- Shingrix (1 of 2) 10/01/2022 (Originally 03/14/1951)   Medicare Annual Wellness (AWV)  07/02/2023   Pneumonia Vaccine 49+ Years old  Completed   INFLUENZA VACCINE  Completed   DEXA SCAN  Completed   HPV VACCINES  Aged Out   Health Maintenance There are no preventive care reminders to display for this patient.  Lung Cancer Screening: (Low Dose CT Chest recommended if Age 45-80 years, 30 pack-year currently smoking OR have quit w/in 15years.) does not qualify.   Vision Screening: Recommended annual ophthalmology exams for early detection of glaucoma and other disorders of the eye.  Dental Screening: Recommended annual dental exams for proper oral  hygiene.  Community Resource Referral / Chronic Care Management: CRR required this visit?  No   CCM required this visit?  No      Plan:     I have personally reviewed and noted the following in the patient's chart:   Medical and social history Use of alcohol, tobacco or illicit drugs  Current medications and supplements including opioid prescriptions. Patient is not currently taking opioid prescriptions. Functional ability and status Nutritional status Physical activity Advanced directives List of other physicians Hospitalizations, surgeries, and ER visits in previous 12 months Vitals Screenings to include cognitive, depression, and falls Referrals and appointments  In addition, I have reviewed and discussed with patient certain preventive protocols, quality metrics, and best practice recommendations. A written personalized care plan for preventive services as well as general preventive health recommendations were provided to patient.     Leta Jungling, LPN   10/93/2355

## 2022-07-23 ENCOUNTER — Encounter: Payer: Self-pay | Admitting: Family

## 2022-07-23 ENCOUNTER — Ambulatory Visit: Payer: Medicare Other | Admitting: Family

## 2022-07-23 ENCOUNTER — Telehealth: Payer: Self-pay | Admitting: Family

## 2022-07-23 VITALS — BP 128/82 | HR 78 | Temp 97.9°F | Wt 155.8 lb

## 2022-07-23 DIAGNOSIS — R4182 Altered mental status, unspecified: Secondary | ICD-10-CM

## 2022-07-23 LAB — URINALYSIS, ROUTINE W REFLEX MICROSCOPIC
Bilirubin Urine: NEGATIVE
Ketones, ur: NEGATIVE
Nitrite: NEGATIVE
Specific Gravity, Urine: 1.025 (ref 1.000–1.030)
Total Protein, Urine: NEGATIVE
Urine Glucose: NEGATIVE
Urobilinogen, UA: 0.2 (ref 0.0–1.0)
pH: 5.5 (ref 5.0–8.0)

## 2022-07-23 NOTE — Telephone Encounter (Signed)
I have ordered urine culture and urinalysis.  Did patient leave urine today?

## 2022-07-23 NOTE — Assessment & Plan Note (Signed)
Patient's mental status has returned to baseline.  Reassuring exam today.  She is not tachycardic, tachypneic.  She is afebrile.  She was unable to urinate in the office and she has been drinking water in order to leave urine studies today.  Patient and daughter and I discussed staying vigilant particularly in the setting of dementia for any mental status changes which may raise concern for infection.  At this time we jointly agreed to wait on urine culture prior to any antibiotic therapy particularly with recent diarrhea.

## 2022-07-23 NOTE — Progress Notes (Signed)
Assessment & Plan:  Altered mental status, unspecified altered mental status type Assessment & Plan: Patient's mental status has returned to baseline.  Reassuring exam today.  She is not tachycardic, tachypneic.  She is afebrile.  She was unable to urinate in the office and she has been drinking water in order to leave urine studies today.  Patient and daughter and I discussed staying vigilant particularly in the setting of dementia for any mental status changes which may raise concern for infection.  At this time we jointly agreed to wait on urine culture prior to any antibiotic therapy particularly with recent diarrhea.   Orders: -     Urinalysis, Routine w reflex microscopic; Future -     Urine Culture; Future     Return precautions given.   Risks, benefits, and alternatives of the medications and treatment plan prescribed today were discussed, and patient expressed understanding.   Education regarding symptom management and diagnosis given to patient on AVS either electronically or printed.  No follow-ups on file.  [name], FNP  Subjective:    Patient ID: [name], female    DOB: February 22, 1932, 88 y.o.   MRN: [id number]  CC: [name] is a 88 y.o. female who presents today for an acute visit.    HPI: Accompanied by daughter and son  Daughter is primary historian and complains of mother being restless 3 nights ago .  This lasted over the duration of the night.  Patient ended up sleeping with her daughter in the bed.  3 days ago, had couple episodes of loose to watery brown diarrhea. Resolved with imodium. No further episodes of diarrhea.   No fever, abdominal pain.    Daughter noticed yesterday that she was excessively talkable and confused.  Usually she is more quiet.  Daughter was concerned for UTIs previously been hospitalized for sepsis.   Mental status has since returned to baseline today.   She is drinking water and gatorade. Appetite is good.       History of CKD.  Following Dr. Holley Raring last seen 06/05/2022 Allergies: Codeine, Darvon [propoxyphene hcl], and Propoxyphene Current Outpatient Medications on File Prior to Visit  Medication Sig Dispense Refill   busPIRone (BUSPAR) 10 MG tablet Take 2 tablets (20 mg total) by mouth at bedtime. 180 tablet 3   Cholecalciferol (VITAMIN D-3 PO) Take 1 capsule by mouth daily. 500iu     escitalopram (LEXAPRO) 10 MG tablet Take 1 tablet (10 mg total) by mouth daily. 90 tablet 3   famotidine (PEPCID) 40 MG tablet Take 1 tablet (40 mg total) by mouth daily. 90 tablet 1   QUEtiapine (SEROQUEL) 50 MG tablet Take 1-1.5 tablets (50-75 mg total) by mouth at bedtime. (Patient taking differently: Take 75 mg by mouth at bedtime.) 45 tablet 2   rosuvastatin (CRESTOR) 5 MG tablet Take 1 tablet (5 mg total) by mouth every evening. 90 tablet 3   vitamin B-12 (CYANOCOBALAMIN) 1000 MCG tablet Take 1,000 mcg by mouth daily.     No current facility-administered medications on file prior to visit.    Review of Systems  Constitutional:  Negative for chills and fever.  Respiratory:  Negative for cough.   Cardiovascular:  Negative for chest pain and palpitations.  Gastrointestinal:  Negative for abdominal pain, nausea and vomiting.  Genitourinary:  Negative for dysuria.      Objective:    BP 128/82   Pulse 78   Temp 97.9 F (36.6 C) (Oral)   Wt  155 lb 12.8 oz (70.7 kg)   SpO2 95%   BMI 27.60 kg/m   BP Readings from Last 3 Encounters:  07/23/22 128/82  04/17/22 120/78  04/04/22 118/70   Wt Readings from Last 3 Encounters:  07/23/22 155 lb 12.8 oz (70.7 kg)  07/01/22 161 lb (73 kg)  04/17/22 162 lb 12.8 oz (73.8 kg)    Physical Exam Vitals reviewed.  Constitutional:      Appearance: Normal appearance. She is well-developed.  Eyes:     Conjunctiva/sclera: Conjunctivae normal.  Cardiovascular:     Rate and Rhythm: Normal rate and regular rhythm.     Pulses: Normal pulses.     Heart  sounds: Normal heart sounds.  Pulmonary:     Effort: Pulmonary effort is normal.     Breath sounds: Normal breath sounds. No wheezing, rhonchi or rales.  Abdominal:     General: Bowel sounds are normal. There is no distension.     Palpations: Abdomen is soft. Abdomen is not rigid. There is no fluid wave or mass.     Tenderness: There is no abdominal tenderness. There is no guarding or rebound.  Skin:    General: Skin is warm and dry.  Neurological:     Mental Status: She is alert.  Psychiatric:        Speech: Speech normal.        Behavior: Behavior normal.        Thought Content: Thought content normal.

## 2022-07-24 LAB — URINE CULTURE
MICRO NUMBER:: 14303523
SPECIMEN QUALITY:: ADEQUATE

## 2022-08-13 ENCOUNTER — Telehealth: Payer: Self-pay

## 2022-08-13 NOTE — Telephone Encounter (Signed)
Returned call to daughter, reports mother is not eating and drinking taking medications.  EMS was contacted yesterday and they came and evaluated her and did not feel like she was in imminent crisis and hence did not take her to the emergency department since her vitals were stable as per daughter.  Daughter wonders if medications could be given as injectable since she is not taking oral medications.  Discussed with daughter that injectable medications needs to be provided under supervision of a medical professional and hence if she continues to not take anything orally and if she has altered mental status she needs to be taken to the nearest emergency department for evaluation, possible admission for failure to thrive.

## 2022-08-13 NOTE — Telephone Encounter (Signed)
pt daughter called states that her mother is not eatting, drinking or taking her medications. Pt next appt is not until 10-01-22

## 2022-08-14 ENCOUNTER — Ambulatory Visit (INDEPENDENT_AMBULATORY_CARE_PROVIDER_SITE_OTHER): Payer: Medicare Other | Admitting: Family

## 2022-08-14 ENCOUNTER — Ambulatory Visit: Payer: Medicare Other

## 2022-08-14 ENCOUNTER — Encounter: Payer: Self-pay | Admitting: Family

## 2022-08-14 VITALS — BP 100/61 | HR 73 | Temp 97.9°F | Resp 16 | Ht 63.0 in | Wt 155.8 lb

## 2022-08-14 DIAGNOSIS — R399 Unspecified symptoms and signs involving the genitourinary system: Secondary | ICD-10-CM

## 2022-08-14 DIAGNOSIS — R400 Somnolence: Secondary | ICD-10-CM | POA: Insufficient documentation

## 2022-08-14 NOTE — Assessment & Plan Note (Addendum)
Patient is not responding to questions today.  Unable to assess mental status.  Patient stated once in my presence that she is 'worried about [name]' ( her daughter). Otherwise she is quiet, gazing at floor or wall in exam room. daughter is primary historian.  Vital signs are stable.  On exam, patient is not grimacing or appear to be in pain.  Patient and daughter decline blood work, urine, chest x-ray in the office today to evaluate for sepsis, infection, hydration status.  Patient and daughter also decline CT head to rule out CVA in the setting of mental status changes.  Long discussion with daughter in regards to hallucinations, somnolence, decreased appetite and how this is fluctuating; I suspect this may be related to dementia and progression thereof. Further concerns as patient is not reliably taking Seroquel, Lexapro, or BuSpar.  Advised to follow-up Dr. Shea Evans.  I placed a referral to palliative care.  Daughter will let me know how her mom is doing.

## 2022-08-14 NOTE — Patient Instructions (Signed)
Please let me know how your mom is doing.  I placed a referral to palliative care  Let us know if you dont hear back within a week in regards to an appointment being scheduled.   So that you are aware, if you are [facility] MyChart user , please pay attention to your MyChart messages as you may receive a MyChart message with a phone number to call and schedule this test/appointment own your own from our referral coordinator. This is a new process so I do not want you to miss this message.  If you are not a MyChart user, you will receive a phone call.

## 2022-08-14 NOTE — Progress Notes (Signed)
Assessment & Plan:  Somnolence Assessment & Plan: Patient is not responding to questions today.  Unable to assess mental status.  Patient stated once in my presence that she is 'worried about [name]' ( her daughter). Otherwise she is quiet, gazing at floor or wall in exam room. daughter is primary historian.  Vital signs are stable.  On exam, patient is not grimacing or appear to be in pain.  Patient and daughter decline blood work, urine, chest x-ray in the office today to evaluate for sepsis, infection, hydration status.  Patient and daughter also decline CT head to rule out CVA in the setting of mental status changes.  Long discussion with daughter in regards to hallucinations, somnolence, decreased appetite and how this is fluctuating; I suspect this may be related to dementia and progression thereof. Further concerns as patient is not reliably taking Seroquel, Lexapro, or BuSpar.  Advised to follow-up Dr. Shea [name].  I placed a referral to palliative care.  Daughter will let me know how her mom is doing.  Orders: -     DG Chest 2 View -     Amb Referral to Palliative Care -     CBC with Differential/Platelet; Future -     Comprehensive metabolic panel; Future -     Lactic acid, plasma; Future -     Procalcitonin; Future  UTI symptoms -     Urinalysis, Routine w reflex microscopic; Future -     Urine Culture; Future     Return precautions given.   Risks, benefits, and alternatives of the medications and treatment plan prescribed today were discussed, and patient expressed understanding.   Education regarding symptom management and diagnosis given to patient on AVS either electronically or printed.  No follow-ups on file.  [name] Paris, FNP  Subjective:    Patient ID: [name], female    DOB: Dec 17, 1931, 89 y.o.   MRN: [id number]  CC: [name] is a 89 y.o. female who presents today for an acute visit.    HPI: Accompanied by daughter today, [name] and family friend.   Daughter is historian. She hasn't taken medications 'for a while. ' Yesterday was a better day, she wanted to wash up and bathe. She drank small glass of water. She also ate scrambled egg and toast yesterday. If she has eaten anything , it has been several days where she wouldn't eat anything.  She  She hasn't used cipap as patient was screaming and thought it was a 'snake on her head.' She is 'hollaring' more and having hallucination. Patient is somnolent.   She did take lexapro, buspar and seroquel yesterday however this has been very infrequent.  She is urinating and having a bowel movement as daughter has witness. No blood in stool or urine observed. Patient is not complaining of any  pain.  She hasn't fallen.   Concern for not eating, dehydration.   She had EMS come on New Years day and patient resistant to going to ED.   No fever , chills, urinary frequency, cough.    She has not taken medication  history of dementia She follows Dr. Shea [name] for depression, delusion, hallucination  Hospital admission August 2023 altered mental status, UTI with sepsis  Allergies: Codeine, Darvon [propoxyphene hcl], and Propoxyphene Current Outpatient Medications on File Prior to Visit  Medication Sig Dispense Refill   busPIRone (BUSPAR) 10 MG tablet Take 2 tablets (20 mg total) by mouth at bedtime. 180 tablet 3   Cholecalciferol (VITAMIN  D-3 PO) Take 1 capsule by mouth daily. 500iu     escitalopram (LEXAPRO) 10 MG tablet Take 1 tablet (10 mg total) by mouth daily. 90 tablet 3   famotidine (PEPCID) 40 MG tablet Take 1 tablet (40 mg total) by mouth daily. 90 tablet 1   QUEtiapine (SEROQUEL) 50 MG tablet Take 1-1.5 tablets (50-75 mg total) by mouth at bedtime. (Patient taking differently: Take 75 mg by mouth at bedtime.) 45 tablet 2   rosuvastatin (CRESTOR) 5 MG tablet Take 1 tablet (5 mg total) by mouth every evening. 90 tablet 3   vitamin B-12 (CYANOCOBALAMIN) 1000 MCG tablet Take 1,000 mcg by  mouth daily.     No current facility-administered medications on file prior to visit.    Review of Systems  Constitutional:  Positive for appetite change and fatigue. Negative for chills and fever.  Respiratory:  Negative for cough.   Cardiovascular:  Negative for chest pain and palpitations.  Gastrointestinal:  Negative for nausea and vomiting.  Psychiatric/Behavioral:  Positive for confusion and hallucinations.       Objective:    BP 100/61   Pulse 73   Temp 97.9 F (36.6 C) (Oral)   Resp 16   Ht '5\' 3"'$  (1.6 m)   Wt 155 lb 12.8 oz (70.7 kg)   SpO2 97%   BMI 27.60 kg/m   BP Readings from Last 3 Encounters:  08/14/22 100/61  07/23/22 128/82  05/21/22 (!) 117/55   Wt Readings from Last 3 Encounters:  08/14/22 155 lb 12.8 oz (70.7 kg)  07/23/22 155 lb 12.8 oz (70.7 kg)  07/01/22 161 lb (73 kg)    Physical Exam Vitals reviewed.  Constitutional:      Appearance: Normal appearance. She is well-developed.  Eyes:     Conjunctiva/sclera: Conjunctivae normal.  Cardiovascular:     Rate and Rhythm: Normal rate and regular rhythm.     Pulses: Normal pulses.     Heart sounds: Normal heart sounds.  Pulmonary:     Effort: Pulmonary effort is normal.     Breath sounds: Normal breath sounds. No wheezing, rhonchi or rales.  Abdominal:     General: Bowel sounds are normal. There is no distension.     Palpations: Abdomen is soft. Abdomen is not rigid. There is no fluid wave or mass.     Tenderness: There is no abdominal tenderness. There is no guarding or rebound.  Skin:    General: Skin is warm and dry.  Neurological:     Mental Status: She is alert.  Psychiatric:        Behavior: Behavior normal.        Thought Content: Thought content normal.     Comments: Patient is quiet. She doesn't respond to questions or directions. Sitting with gaze to floor or wall in wheelchair. Resting comfortably.       I have spent 25 minutes with a patient including precharting, exam,  reviewing medical records, and discussion plan of care.

## 2022-08-16 ENCOUNTER — Telehealth: Payer: Self-pay | Admitting: Family

## 2022-08-16 NOTE — Telephone Encounter (Signed)
Pt daughter called back stating the provider wanted to know the pt condition and she is sleeping a lot. Pt daughter would like to be called

## 2022-08-16 NOTE — Telephone Encounter (Signed)
LVM for [name] pt Daughter to call back

## 2022-08-19 NOTE — Telephone Encounter (Signed)
Pt daughter called wanting an update about referrals

## 2022-08-20 NOTE — Telephone Encounter (Signed)
Spoke to pt daughter [name] and just explained to her that if she had not heard anything by next Monday the 15th to give me a call back and I will reach out myself.

## 2022-08-23 ENCOUNTER — Telehealth: Payer: Self-pay

## 2022-08-23 NOTE — Telephone Encounter (Signed)
Spoke to pt daughter [name] and she stated that she has spoke to [place] at [facility] and she stated that  the criteria has changed and they do not take any referrals unless they only have 6 mnths or on hospice already.

## 2022-08-28 ENCOUNTER — Other Ambulatory Visit: Payer: Self-pay | Admitting: Family

## 2022-08-28 DIAGNOSIS — F02818 Dementia in other diseases classified elsewhere, unspecified severity, with other behavioral disturbance: Secondary | ICD-10-CM

## 2022-08-29 ENCOUNTER — Telehealth: Payer: Self-pay | Admitting: Family

## 2022-08-29 NOTE — Telephone Encounter (Signed)
LVM for [name] pts daughter to give me a call back here at the office

## 2022-08-29 NOTE — Telephone Encounter (Signed)
[name] from [facility] called wanting medical records for the pt regarding A lewy body dementia diagnosis

## 2022-08-29 NOTE — Telephone Encounter (Signed)
Pt has been accepted by [facility]. Thank you!

## 2022-09-03 NOTE — Telephone Encounter (Signed)
Called to check on pt and pt daughter [name] informed me that [name] passed away on 22-Sep-2022 at her home.

## 2022-09-12 DEATH — deceased

## 2022-10-01 ENCOUNTER — Telehealth: Payer: Medicare Other | Admitting: Psychiatry

## 2022-11-08 ENCOUNTER — Ambulatory Visit: Payer: Medicare Other | Admitting: Family
